# Patient Record
Sex: Female | Born: 1955 | Race: Black or African American | Hispanic: No | Marital: Married | Smoking: Former smoker
Health system: Southern US, Community
[De-identification: ages and names within clinical notes are randomized; demographics above are authoritative.]

## PROBLEM LIST (undated history)

## (undated) DIAGNOSIS — M1990 Unspecified osteoarthritis, unspecified site: Secondary | ICD-10-CM

## (undated) DIAGNOSIS — G8929 Other chronic pain: Secondary | ICD-10-CM

## (undated) DIAGNOSIS — E559 Vitamin D deficiency, unspecified: Secondary | ICD-10-CM

## (undated) DIAGNOSIS — R7303 Prediabetes: Secondary | ICD-10-CM

## (undated) DIAGNOSIS — E785 Hyperlipidemia, unspecified: Secondary | ICD-10-CM

## (undated) DIAGNOSIS — R600 Localized edema: Secondary | ICD-10-CM

## (undated) DIAGNOSIS — I10 Essential (primary) hypertension: Secondary | ICD-10-CM

## (undated) DIAGNOSIS — M2550 Pain in unspecified joint: Secondary | ICD-10-CM

## (undated) HISTORY — PX: BREAST BIOPSY: SHX20

## (undated) HISTORY — DX: Vitamin D deficiency, unspecified: E55.9

## (undated) HISTORY — DX: Pain in unspecified joint: M25.50

## (undated) HISTORY — DX: Hyperlipidemia, unspecified: E78.5

## (undated) HISTORY — DX: Localized edema: R60.0

## (undated) HISTORY — DX: Unspecified osteoarthritis, unspecified site: M19.90

## (undated) HISTORY — DX: Essential (primary) hypertension: I10

## (undated) HISTORY — PX: TONSILLECTOMY: SUR1361

## (undated) HISTORY — DX: Prediabetes: R73.03

## (undated) HISTORY — PX: OTHER SURGICAL HISTORY: SHX169

---

## 2009-06-21 ENCOUNTER — Emergency Department (HOSPITAL_COMMUNITY): Admission: EM | Admit: 2009-06-21 | Discharge: 2009-06-21 | Payer: Self-pay | Admitting: Emergency Medicine

## 2010-01-12 ENCOUNTER — Inpatient Hospital Stay (HOSPITAL_COMMUNITY): Admission: EM | Admit: 2010-01-12 | Discharge: 2010-01-15 | Payer: Self-pay | Admitting: Emergency Medicine

## 2010-04-30 ENCOUNTER — Encounter: Payer: Self-pay | Admitting: Orthopedic Surgery

## 2010-05-07 ENCOUNTER — Ambulatory Visit: Payer: Self-pay | Admitting: Orthopedic Surgery

## 2010-05-07 DIAGNOSIS — M1710 Unilateral primary osteoarthritis, unspecified knee: Secondary | ICD-10-CM

## 2010-05-13 ENCOUNTER — Encounter (HOSPITAL_COMMUNITY)
Admission: RE | Admit: 2010-05-13 | Discharge: 2010-05-30 | Payer: Self-pay | Source: Home / Self Care | Admitting: Orthopedic Surgery

## 2010-05-20 ENCOUNTER — Encounter: Payer: Self-pay | Admitting: Orthopedic Surgery

## 2010-06-03 ENCOUNTER — Encounter (HOSPITAL_COMMUNITY): Admission: RE | Admit: 2010-06-03 | Discharge: 2010-07-03 | Payer: Self-pay | Admitting: Orthopedic Surgery

## 2010-06-20 ENCOUNTER — Encounter: Payer: Self-pay | Admitting: Orthopedic Surgery

## 2010-09-30 NOTE — Letter (Signed)
Summary: History form  History form   Imported By: Jacklynn Ganong 05/09/2010 10:11:03  _____________________________________________________________________  External Attachment:    Type:   Image     Comment:   External Document

## 2010-09-30 NOTE — Miscellaneous (Signed)
Summary: PT initial evaluation  PT initial evaluation   Imported By: Jacklynn Ganong 05/20/2010 12:19:03  _____________________________________________________________________  External Attachment:    Type:   Image     Comment:   External Document

## 2010-09-30 NOTE — Assessment & Plan Note (Signed)
Summary: BI KNEE PAIN NEEDS XR/REG MEDICAID/BSF   Vital Signs:  Patient profile:   55 year old female Height:      69 inches Weight:      266 pounds Pulse rate:   78 / minute Resp:     18 per minute  Vitals Entered By: Fuller Canada MD (May 07, 2010 9:01 AM)  Visit Type:  new patient Referring Provider:  self Primary Provider:  [place]  CC:  bilateral knee pain.  History of Present Illness: I saw Yesenia Montgomery in the office today for an initial visit.  She is a 55 years old woman with the complaint of:  bilateral knee pain.  Xrays brought from [facility] 2009, new xrays to be taken today.  Meds: HCTZ, Lisinopril.   This 55 year old female has been on multiple anti-inflammatories including Naprosyn, Vioxx, Bextra for knee pain and arthritis who presents after moving from [place] to [facility] seeking orthopaedic care for severe bilateral medial and anterior knee pain which is nonradiating.  Her symptoms seem to be modified by weightbearing activities and there is associated swelling and catching sensations in the knee.  She reports her pain as 10 out of 10.  She did get relief from Vioxx until they took it off the market.     Allergies (verified): No Known Drug Allergies  Past History:  Past Medical History: htn  Past Surgical History: na  Family History: na  Social History: Patient is separated.  unemployed no smoking no alcohol caffeine use daily some college  Review of Systems Cardiovascular:  Denies chest pain, angina, heart attack, heart failure, poor circulation, blood clots, and phlebitis. Respiratory:  Denies short of breath, difficulty breathing, COPD, cough, and pneumonia. Gastrointestinal:  Denies nausea, vomiting, diarrhea, constipation, difficulty swallowing, ulcers, GERD, and reflux. Genitourinary:  Denies kidney failure, kidney transplant, kidney stones, burning, poor stream, testicular cancer, blood in urine, and . Neurologic:  Denies  headache, dizziness, migraines, numbness, weakness, tremor, and unsteady walking. Musculoskeletal:  Complains of joint pain and joint swelling; denies rheumatoid arthritis, gout, bone cancer, osteoporosis, and ; stiffness. Endocrine:  Denies thyroid disease, goiter, and diabetes. Psychiatric:  Denies depression, mood swings, anxiety, panic attack, bipolar, and schizophrenia. Skin:  Denies eczema, cancer, and itching. HEENT:  Denies poor vision, cataracts, glaucoma, poor hearing, vertigo, ears ringing, sinusitis, hoarseness, toothaches, and bleeding gums. Hemoatologic:  Denies lymph node cancer and lymph edema.  The review of systems is negative for Constitutional and Immunology.  Physical Exam  Additional Exam:  GEN: well developed, well nourished, normal grooming and hygiene, no deformity and normal body habitus.   PPI:RJJOA. extremites: pulses are normal, mild edema, no erythema. no tenderness  Skin: LE'S: no rashes, skin lesions or open sores   NEURO: LE'S: normal coordination, reflexes, sensation.   Psyche: awake, alert and oriented. Mood normal   Gait: normal heel toe gait  RIGHT knee medial joint line tenderness small joint effusion range of motion 105 appears to have full extension with mild valgus.  Muscle tone quadriceps bulk extension power normal.  Knee stable.  LEFT knee flexion arc 105 full extension appears to have valgus.  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 1 by mouth two times a day  Orders: Physical  Therapy Referral (PT) New Patient Level III (28413) Knees  x-ray bilateral ([id number]) Injection, Tendon / Ligament (25366) Depo- Medrol 40mg  (J1030) bilateral knee x-rays show bilateral osteoarthritis with cyst  formation notable for LEFT knee lateral tibial plateau  Patient has end-stage osteoarthritis of both knees.  Films from 2009 show cyst formation joint space narrowing as well  Bilateral knee injections RIGHT leg first following technique Verbal consent was obtained. The knee was prepped with alcohol and ethyl chloride. 1 cc of depomedrol 40mg /cc and 4 cc of lidocaine 1% was injected. there were no complications.  Basically this is a 55 year old female who has not had full medical management of her osteoarthritis.  So it like to get her to lose about 25 pounds switch to another anti-inflammatory given the 2 injections e and her knee exercise program see how she does.  She'll obviously have to have knee replacements at some point.  Medications Added to Medication List This Visit: 1)  Diclofenac Sodium 50 Mg Tbec (Diclofenac sodium) .Marland Kitchen.. 1 by mouth two times a day  Patient Instructions: 1)  you have arthritis in your knees  2)  you need  to lose about 25 lbs 3)  you need to do a therapy program  4)  new medication: diclofenac 50 mg two times a day 5)  You have received an injection of cortisone today. You may experience increased pain at the injection site. Apply ice pack to the area for 20 minutes every 2 hours and take 2 xtra strength tylenol every 8 hours. This increased pain will usually resolve in 24 hours. The injection will take effect in 3-10 days.  6)    7)  f/u as needed  Prescriptions: DICLOFENAC SODIUM 50 MG TBEC (DICLOFENAC SODIUM) 1 by mouth two times a day  #60 x 5   Entered and Authorized by:   Fuller Canada MD   Signed by:   Fuller Canada MD on 05/07/2010   Method used:   Print then Give to Patient   RxID:   [id number]

## 2010-09-30 NOTE — Miscellaneous (Signed)
Summary: PT progress note  PT progress note   Imported By: Jacklynn Ganong 06/26/2010 07:55:43  _____________________________________________________________________  External Attachment:    Type:   Image     Comment:   External Document

## 2010-10-21 ENCOUNTER — Encounter: Payer: Self-pay | Admitting: Orthopedic Surgery

## 2010-10-21 ENCOUNTER — Ambulatory Visit (INDEPENDENT_AMBULATORY_CARE_PROVIDER_SITE_OTHER): Payer: Medicare Other | Admitting: Orthopedic Surgery

## 2010-10-21 ENCOUNTER — Telehealth: Payer: Self-pay | Admitting: Orthopedic Surgery

## 2010-10-21 DIAGNOSIS — M1710 Unilateral primary osteoarthritis, unspecified knee: Secondary | ICD-10-CM

## 2010-10-28 NOTE — Assessment & Plan Note (Signed)
Summary: Req inj in both knee,treated before,says reg mcd/bsf   Visit Type:  Follow-up Referring Provider:  self Primary Provider:  [place]  CC:  BILATERAL KNEE PAIN.  History of Present Illness: diagnosis bilateral knee osteoarthritis.  Patient here for followup as a 55 year old female with bilateral knee pain and bilateral knee arthritis, who was treated nonoperatively with injections, and anti-inflammatories.  She would like to get injected again. Did well really until this past Sunday when she had increased pain. Her last 2 months. It seems like her pain has been increasing.  Meds: HCTZ, Lisinopril, Diclofenac. She needs refill on Diclofenac.  Last seen on 05-07-10 and had injections in both knees.  Systems weakness in her legs and sense of loss of balance.     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 The left knee was prepped with alcohol and ethyl chloride. 1 cc of depomedrol 40mg /cc and 4 cc of lidocaine 1% was injected. there were no complications.   Verbal consent was obtained. The RIGHT knee was prepped with alcohol and ethyl chloride. 1 cc of depomedrol 40mg /cc and 4 cc of lidocaine 1% was injected. there were no complications.  Her updated medication list for this problem includes:    Diclofenac Sodium 50 Mg Tbec (Diclofenac sodium) .Marland Kitchen... 1 by mouth two times a day  Orders: Joint Aspirate / Injection, Large (20610) Depo- Medrol 40mg  (J1030)  Patient Instructions: 1)  Please schedule a follow-up appointment as needed. 2)  You have received an injection of cortisone today. You may experience increased pain at the injection site. Apply ice pack to the area for 20 minutes every 2 hours and take 2 xtra strength tylenol every 8 hours. This increased pain will usually resolve in 24 hours. The  injection will take effect in 3-10 days.  3)  exercises as directed  Prescriptions: DICLOFENAC SODIUM 50 MG TBEC (DICLOFENAC SODIUM) 1 by mouth two times a day  #60 x 5   Entered and Authorized by:   Fuller Canada MD   Signed by:   Fuller Canada MD on 10/21/2010   Method used:   Print then Give to Patient   RxID:   [id number]    Orders Added: 1)  Joint Aspirate / Injection, Large [20610] 2)  Depo- Medrol 40mg  [J1030]

## 2010-10-28 NOTE — Medication Information (Signed)
Summary: Tax adviser   Imported By: Cammie Sickle 10/21/2010 19:06:09  _____________________________________________________________________  External Attachment:    Type:   Image     Comment:   External Document

## 2010-10-28 NOTE — Progress Notes (Signed)
  Phone Note Other Incoming   Summary of Call: Per Rob [employer] compliance,we must file Medicare primary, Medicaid secondary for this patient.  If patient chooses not to file Medicare/Medicaid, she will be set up as sselfpay and responsible for all charges. Patient requested that Medicare not be filed, only wanted [place] to file Medicaid.  This pertains to her 10/21/10 appointment with Dr. Romeo Apple.  Initial call taken by: Jacklynn Ganong,  October 21, 2010 4:58 PM

## 2010-11-17 LAB — DIFFERENTIAL
Basophils Relative: 0 % (ref 0–1)
Eosinophils Absolute: 0.1 10*3/uL (ref 0.0–0.7)
Eosinophils Absolute: 0.2 10*3/uL (ref 0.0–0.7)
Eosinophils Relative: 1 % (ref 0–5)
Eosinophils Relative: 4 % (ref 0–5)
Lymphocytes Relative: 17 % (ref 12–46)
Lymphs Abs: 2.1 10*3/uL (ref 0.7–4.0)
Monocytes Absolute: 0.5 10*3/uL (ref 0.1–1.0)
Monocytes Relative: 9 % (ref 3–12)
Neutro Abs: 5.7 10*3/uL (ref 1.7–7.7)
Neutro Abs: 8.3 10*3/uL — ABNORMAL HIGH (ref 1.7–7.7)
Neutrophils Relative %: 59 % (ref 43–77)

## 2010-11-17 LAB — CBC
HCT: 32.4 % — ABNORMAL LOW (ref 36.0–46.0)
HCT: 33.3 % — ABNORMAL LOW (ref 36.0–46.0)
HCT: 33.8 % — ABNORMAL LOW (ref 36.0–46.0)
Hemoglobin: 11.3 g/dL — ABNORMAL LOW (ref 12.0–15.0)
Hemoglobin: 11.6 g/dL — ABNORMAL LOW (ref 12.0–15.0)
MCHC: 34.9 g/dL (ref 30.0–36.0)
MCV: 85 fL (ref 78.0–100.0)
MCV: 85.1 fL (ref 78.0–100.0)
MCV: 85.3 fL (ref 78.0–100.0)
Platelets: 261 10*3/uL (ref 150–400)
Platelets: 305 10*3/uL (ref 150–400)
Platelets: 402 10*3/uL — ABNORMAL HIGH (ref 150–400)
RBC: 3.92 MIL/uL (ref 3.87–5.11)
RBC: 3.97 MIL/uL (ref 3.87–5.11)
RDW: 14.2 % (ref 11.5–15.5)
RDW: 14.3 % (ref 11.5–15.5)
WBC: 11.1 10*3/uL — ABNORMAL HIGH (ref 4.0–10.5)
WBC: 8.6 10*3/uL (ref 4.0–10.5)

## 2010-11-17 LAB — URINE CULTURE

## 2010-11-17 LAB — COMPREHENSIVE METABOLIC PANEL
ALT: 24 U/L (ref 0–35)
Albumin: 3.2 g/dL — ABNORMAL LOW (ref 3.5–5.2)
Alkaline Phosphatase: 65 U/L (ref 39–117)
Calcium: 9 mg/dL (ref 8.4–10.5)
Chloride: 100 mEq/L (ref 96–112)
GFR calc non Af Amer: >60 mL/min (ref >60)
Potassium: 3.9 mEq/L (ref 3.5–5.1)
Total Bilirubin: 0.7 mg/dL (ref 0.3–1.2)

## 2010-11-17 LAB — BASIC METABOLIC PANEL
BUN: 6 mg/dL (ref 6–23)
CO2: 29 mEq/L (ref 19–32)
Calcium: 8.7 mg/dL (ref 8.4–10.5)
Chloride: 107 mEq/L (ref 96–112)
Creatinine, Ser: 0.75 mg/dL (ref 0.4–1.2)
Creatinine, Ser: 0.83 mg/dL (ref 0.4–1.2)
Glucose, Bld: 98 mg/dL (ref 70–99)
Sodium: 139 mEq/L (ref 135–145)

## 2010-11-17 LAB — CULTURE, BLOOD (ROUTINE X 2)
Culture: NO GROWTH
Report Status: 5202011

## 2010-11-17 LAB — URINE MICROSCOPIC-ADD ON

## 2010-11-17 LAB — URINALYSIS, ROUTINE W REFLEX MICROSCOPIC
Glucose, UA: NEGATIVE mg/dL
Ketones, ur: NEGATIVE mg/dL
Nitrite: NEGATIVE
Protein, ur: NEGATIVE mg/dL
Specific Gravity, Urine: 1.01 (ref 1.005–1.030)

## 2010-11-17 LAB — EXPECTORATED SPUTUM ASSESSMENT W GRAM STAIN, RFLX TO RESP C

## 2010-11-17 LAB — CULTURE, RESPIRATORY W GRAM STAIN

## 2010-11-21 ENCOUNTER — Emergency Department (HOSPITAL_COMMUNITY): Payer: Medicare Other

## 2010-11-21 ENCOUNTER — Emergency Department (HOSPITAL_COMMUNITY)
Admission: EM | Admit: 2010-11-21 | Discharge: 2010-11-21 | Disposition: A | Discharge status: Home / Self Care | Payer: Medicare Other | Attending: Emergency Medicine | Admitting: Emergency Medicine

## 2010-11-21 DIAGNOSIS — S42453A Displaced fracture of lateral condyle of unspecified humerus, initial encounter for closed fracture: Secondary | ICD-10-CM | POA: Insufficient documentation

## 2010-11-21 DIAGNOSIS — Y92009 Unspecified place in unspecified non-institutional (private) residence as the place of occurrence of the external cause: Secondary | ICD-10-CM | POA: Insufficient documentation

## 2010-11-21 DIAGNOSIS — I10 Essential (primary) hypertension: Secondary | ICD-10-CM | POA: Insufficient documentation

## 2010-11-21 DIAGNOSIS — W19XXXA Unspecified fall, initial encounter: Secondary | ICD-10-CM | POA: Insufficient documentation

## 2010-11-21 DIAGNOSIS — S52009A Unspecified fracture of upper end of unspecified ulna, initial encounter for closed fracture: Secondary | ICD-10-CM | POA: Insufficient documentation

## 2010-12-19 ENCOUNTER — Ambulatory Visit (HOSPITAL_COMMUNITY)
Admission: RE | Admit: 2010-12-19 | Discharge: 2010-12-19 | Disposition: A | Discharge status: Home / Self Care | Payer: Medicare Other | Source: Ambulatory Visit | Attending: Physical Therapy | Admitting: Physical Therapy

## 2010-12-19 DIAGNOSIS — M25529 Pain in unspecified elbow: Secondary | ICD-10-CM | POA: Insufficient documentation

## 2010-12-19 DIAGNOSIS — M25429 Effusion, unspecified elbow: Secondary | ICD-10-CM | POA: Insufficient documentation

## 2010-12-19 DIAGNOSIS — M25439 Effusion, unspecified wrist: Secondary | ICD-10-CM | POA: Insufficient documentation

## 2010-12-19 DIAGNOSIS — M25629 Stiffness of unspecified elbow, not elsewhere classified: Secondary | ICD-10-CM | POA: Insufficient documentation

## 2010-12-19 DIAGNOSIS — M25539 Pain in unspecified wrist: Secondary | ICD-10-CM | POA: Insufficient documentation

## 2010-12-19 DIAGNOSIS — M25639 Stiffness of unspecified wrist, not elsewhere classified: Secondary | ICD-10-CM | POA: Insufficient documentation

## 2010-12-19 DIAGNOSIS — IMO0001 Reserved for inherently not codable concepts without codable children: Secondary | ICD-10-CM | POA: Insufficient documentation

## 2010-12-19 DIAGNOSIS — M6281 Muscle weakness (generalized): Secondary | ICD-10-CM | POA: Insufficient documentation

## 2010-12-23 ENCOUNTER — Ambulatory Visit (HOSPITAL_COMMUNITY)
Admission: RE | Admit: 2010-12-23 | Discharge: 2010-12-23 | Disposition: A | Discharge status: Home / Self Care | Payer: Medicare Other | Source: Ambulatory Visit | Attending: Orthopaedic Surgery | Admitting: Orthopaedic Surgery

## 2010-12-23 DIAGNOSIS — M25529 Pain in unspecified elbow: Secondary | ICD-10-CM | POA: Insufficient documentation

## 2010-12-23 DIAGNOSIS — M25629 Stiffness of unspecified elbow, not elsewhere classified: Secondary | ICD-10-CM | POA: Insufficient documentation

## 2010-12-23 DIAGNOSIS — M6281 Muscle weakness (generalized): Secondary | ICD-10-CM | POA: Insufficient documentation

## 2010-12-23 DIAGNOSIS — IMO0001 Reserved for inherently not codable concepts without codable children: Secondary | ICD-10-CM | POA: Insufficient documentation

## 2010-12-23 DIAGNOSIS — M25639 Stiffness of unspecified wrist, not elsewhere classified: Secondary | ICD-10-CM | POA: Insufficient documentation

## 2010-12-25 ENCOUNTER — Ambulatory Visit (HOSPITAL_COMMUNITY)
Admission: RE | Admit: 2010-12-25 | Discharge: 2010-12-25 | Disposition: A | Discharge status: Home / Self Care | Payer: Medicare Other | Source: Ambulatory Visit | Attending: *Deleted | Admitting: *Deleted

## 2010-12-30 ENCOUNTER — Ambulatory Visit (HOSPITAL_COMMUNITY)
Admission: RE | Admit: 2010-12-30 | Discharge: 2010-12-30 | Disposition: A | Discharge status: Home / Self Care | Payer: Medicare Other | Source: Ambulatory Visit | Attending: Physical Therapy | Admitting: Physical Therapy

## 2010-12-30 DIAGNOSIS — M25629 Stiffness of unspecified elbow, not elsewhere classified: Secondary | ICD-10-CM | POA: Insufficient documentation

## 2010-12-30 DIAGNOSIS — M25539 Pain in unspecified wrist: Secondary | ICD-10-CM | POA: Insufficient documentation

## 2010-12-30 DIAGNOSIS — M6281 Muscle weakness (generalized): Secondary | ICD-10-CM | POA: Insufficient documentation

## 2010-12-30 DIAGNOSIS — M25429 Effusion, unspecified elbow: Secondary | ICD-10-CM | POA: Insufficient documentation

## 2010-12-30 DIAGNOSIS — M25529 Pain in unspecified elbow: Secondary | ICD-10-CM | POA: Insufficient documentation

## 2010-12-30 DIAGNOSIS — M25639 Stiffness of unspecified wrist, not elsewhere classified: Secondary | ICD-10-CM | POA: Insufficient documentation

## 2010-12-30 DIAGNOSIS — M25439 Effusion, unspecified wrist: Secondary | ICD-10-CM | POA: Insufficient documentation

## 2010-12-30 DIAGNOSIS — IMO0001 Reserved for inherently not codable concepts without codable children: Secondary | ICD-10-CM | POA: Insufficient documentation

## 2011-01-01 ENCOUNTER — Ambulatory Visit (HOSPITAL_COMMUNITY)
Admission: RE | Admit: 2011-01-01 | Discharge: 2011-01-01 | Disposition: A | Discharge status: Home / Self Care | Payer: Medicare Other | Source: Ambulatory Visit | Attending: *Deleted | Admitting: *Deleted

## 2011-01-06 ENCOUNTER — Ambulatory Visit (HOSPITAL_COMMUNITY)
Admission: RE | Admit: 2011-01-06 | Discharge: 2011-01-06 | Disposition: A | Discharge status: Home / Self Care | Payer: Medicare Other | Source: Ambulatory Visit | Attending: *Deleted | Admitting: *Deleted

## 2011-01-08 ENCOUNTER — Ambulatory Visit (HOSPITAL_COMMUNITY)
Admission: RE | Admit: 2011-01-08 | Discharge: 2011-01-08 | Disposition: A | Discharge status: Home / Self Care | Payer: Medicare Other | Source: Ambulatory Visit | Attending: *Deleted | Admitting: *Deleted

## 2011-01-14 ENCOUNTER — Ambulatory Visit (HOSPITAL_COMMUNITY)
Admission: RE | Admit: 2011-01-14 | Discharge: 2011-01-14 | Disposition: A | Discharge status: Home / Self Care | Payer: Medicare Other | Source: Ambulatory Visit | Attending: *Deleted | Admitting: *Deleted

## 2011-01-16 ENCOUNTER — Ambulatory Visit (HOSPITAL_COMMUNITY)
Admission: RE | Admit: 2011-01-16 | Discharge: 2011-01-16 | Disposition: A | Discharge status: Home / Self Care | Payer: Medicare Other | Source: Ambulatory Visit | Attending: *Deleted | Admitting: *Deleted

## 2011-01-17 ENCOUNTER — Emergency Department (HOSPITAL_COMMUNITY)
Admission: EM | Admit: 2011-01-17 | Discharge: 2011-01-18 | Disposition: A | Discharge status: Home / Self Care | Payer: Medicare Other | Attending: Emergency Medicine | Admitting: Emergency Medicine

## 2011-01-17 DIAGNOSIS — Z79899 Other long term (current) drug therapy: Secondary | ICD-10-CM | POA: Insufficient documentation

## 2011-01-17 DIAGNOSIS — N390 Urinary tract infection, site not specified: Secondary | ICD-10-CM | POA: Insufficient documentation

## 2011-01-17 DIAGNOSIS — M25569 Pain in unspecified knee: Secondary | ICD-10-CM | POA: Insufficient documentation

## 2011-01-17 DIAGNOSIS — IMO0001 Reserved for inherently not codable concepts without codable children: Secondary | ICD-10-CM | POA: Insufficient documentation

## 2011-01-17 DIAGNOSIS — M25529 Pain in unspecified elbow: Secondary | ICD-10-CM | POA: Insufficient documentation

## 2011-01-17 DIAGNOSIS — I10 Essential (primary) hypertension: Secondary | ICD-10-CM | POA: Insufficient documentation

## 2011-01-18 LAB — BASIC METABOLIC PANEL
Calcium: 10.6 mg/dL — ABNORMAL HIGH (ref 8.4–10.5)
GFR calc non Af Amer: >60 mL/min (ref >60)
Glucose, Bld: 131 mg/dL — ABNORMAL HIGH (ref 70–99)
Potassium: 3.6 mEq/L (ref 3.5–5.1)
Sodium: 138 mEq/L (ref 135–145)

## 2011-01-18 LAB — DIFFERENTIAL
Basophils Relative: 0 % (ref 0–1)
Eosinophils Relative: 2 % (ref 0–5)
Lymphocytes Relative: 35 % (ref 12–46)
Lymphs Abs: 2.5 10*3/uL (ref 0.7–4.0)
Monocytes Absolute: 0.5 10*3/uL (ref 0.1–1.0)
Monocytes Relative: 6 % (ref 3–12)
Neutrophils Relative %: 57 % (ref 43–77)

## 2011-01-18 LAB — CBC
HCT: 39.5 % (ref 36.0–46.0)
Hemoglobin: 12.5 g/dL (ref 12.0–15.0)
RDW: 13.9 % (ref 11.5–15.5)
WBC: 7.1 10*3/uL (ref 4.0–10.5)

## 2011-01-18 LAB — URINE MICROSCOPIC-ADD ON

## 2011-01-18 LAB — URINALYSIS, ROUTINE W REFLEX MICROSCOPIC
Bilirubin Urine: NEGATIVE
Glucose, UA: NEGATIVE mg/dL
Ketones, ur: NEGATIVE mg/dL

## 2011-01-18 LAB — SEDIMENTATION RATE: Sed Rate: 47 mm/hr — ABNORMAL HIGH (ref 0–22)

## 2011-01-20 ENCOUNTER — Ambulatory Visit (HOSPITAL_COMMUNITY)
Admission: RE | Admit: 2011-01-20 | Discharge: 2011-01-20 | Disposition: A | Discharge status: Home / Self Care | Payer: Medicare Other | Source: Ambulatory Visit | Attending: *Deleted | Admitting: *Deleted

## 2011-01-22 ENCOUNTER — Ambulatory Visit (HOSPITAL_COMMUNITY)
Admission: RE | Admit: 2011-01-22 | Discharge: 2011-01-22 | Disposition: A | Discharge status: Home / Self Care | Payer: Medicare Other | Source: Ambulatory Visit | Attending: *Deleted | Admitting: *Deleted

## 2011-01-27 ENCOUNTER — Ambulatory Visit (HOSPITAL_COMMUNITY)
Admission: RE | Admit: 2011-01-27 | Discharge: 2011-01-27 | Disposition: A | Discharge status: Home / Self Care | Payer: Medicare Other | Source: Ambulatory Visit | Attending: *Deleted | Admitting: *Deleted

## 2011-01-29 ENCOUNTER — Ambulatory Visit (HOSPITAL_COMMUNITY)
Admission: RE | Admit: 2011-01-29 | Discharge: 2011-01-29 | Disposition: A | Discharge status: Home / Self Care | Payer: Medicare Other | Source: Ambulatory Visit | Attending: *Deleted | Admitting: *Deleted

## 2011-02-04 ENCOUNTER — Ambulatory Visit (HOSPITAL_COMMUNITY)
Admission: RE | Admit: 2011-02-04 | Discharge: 2011-02-04 | Disposition: A | Discharge status: Home / Self Care | Payer: Medicare Other | Source: Ambulatory Visit | Attending: Orthopedic Surgery | Admitting: Orthopedic Surgery

## 2011-02-04 DIAGNOSIS — M25639 Stiffness of unspecified wrist, not elsewhere classified: Secondary | ICD-10-CM | POA: Insufficient documentation

## 2011-02-04 DIAGNOSIS — M25529 Pain in unspecified elbow: Secondary | ICD-10-CM | POA: Insufficient documentation

## 2011-02-04 DIAGNOSIS — M6281 Muscle weakness (generalized): Secondary | ICD-10-CM | POA: Insufficient documentation

## 2011-02-04 DIAGNOSIS — M25439 Effusion, unspecified wrist: Secondary | ICD-10-CM | POA: Insufficient documentation

## 2011-02-04 DIAGNOSIS — IMO0001 Reserved for inherently not codable concepts without codable children: Secondary | ICD-10-CM | POA: Insufficient documentation

## 2011-02-04 DIAGNOSIS — M25429 Effusion, unspecified elbow: Secondary | ICD-10-CM | POA: Insufficient documentation

## 2011-02-04 DIAGNOSIS — M25629 Stiffness of unspecified elbow, not elsewhere classified: Secondary | ICD-10-CM | POA: Insufficient documentation

## 2011-02-04 DIAGNOSIS — M25539 Pain in unspecified wrist: Secondary | ICD-10-CM | POA: Insufficient documentation

## 2011-02-06 ENCOUNTER — Ambulatory Visit (HOSPITAL_COMMUNITY)
Admission: RE | Admit: 2011-02-06 | Discharge: 2011-02-06 | Disposition: A | Discharge status: Home / Self Care | Payer: Medicare Other | Source: Ambulatory Visit | Attending: Specialist | Admitting: Specialist

## 2011-02-10 ENCOUNTER — Ambulatory Visit (HOSPITAL_COMMUNITY)
Admission: RE | Admit: 2011-02-10 | Discharge: 2011-02-10 | Disposition: A | Discharge status: Home / Self Care | Payer: Medicare Other | Source: Ambulatory Visit | Attending: Specialist | Admitting: Specialist

## 2011-02-12 ENCOUNTER — Ambulatory Visit (HOSPITAL_COMMUNITY): Payer: Medicare Other | Admitting: Specialist

## 2011-02-18 ENCOUNTER — Ambulatory Visit (HOSPITAL_COMMUNITY)
Admission: RE | Admit: 2011-02-18 | Discharge: 2011-02-18 | Disposition: A | Discharge status: Home / Self Care | Payer: Medicare Other | Source: Ambulatory Visit | Attending: *Deleted | Admitting: *Deleted

## 2011-02-20 ENCOUNTER — Ambulatory Visit (HOSPITAL_COMMUNITY)
Admission: RE | Admit: 2011-02-20 | Discharge: 2011-02-20 | Disposition: A | Discharge status: Home / Self Care | Payer: Medicare Other | Source: Ambulatory Visit | Attending: *Deleted | Admitting: *Deleted

## 2011-02-24 ENCOUNTER — Ambulatory Visit (HOSPITAL_COMMUNITY)
Admission: RE | Admit: 2011-02-24 | Discharge: 2011-02-24 | Disposition: A | Discharge status: Home / Self Care | Payer: Medicare Other | Source: Ambulatory Visit | Attending: *Deleted | Admitting: *Deleted

## 2011-02-26 ENCOUNTER — Ambulatory Visit (HOSPITAL_COMMUNITY)
Admission: RE | Admit: 2011-02-26 | Discharge: 2011-02-26 | Disposition: A | Discharge status: Home / Self Care | Payer: Medicare Other | Source: Ambulatory Visit | Attending: *Deleted | Admitting: *Deleted

## 2011-03-03 ENCOUNTER — Ambulatory Visit (HOSPITAL_COMMUNITY)
Admission: RE | Admit: 2011-03-03 | Discharge: 2011-03-03 | Disposition: A | Discharge status: Home / Self Care | Payer: Medicare Other | Source: Ambulatory Visit | Attending: Orthopedic Surgery | Admitting: Orthopedic Surgery

## 2011-03-03 DIAGNOSIS — M25529 Pain in unspecified elbow: Secondary | ICD-10-CM | POA: Insufficient documentation

## 2011-03-03 DIAGNOSIS — M25429 Effusion, unspecified elbow: Secondary | ICD-10-CM | POA: Insufficient documentation

## 2011-03-03 DIAGNOSIS — IMO0001 Reserved for inherently not codable concepts without codable children: Secondary | ICD-10-CM | POA: Insufficient documentation

## 2011-03-03 DIAGNOSIS — M6281 Muscle weakness (generalized): Secondary | ICD-10-CM | POA: Insufficient documentation

## 2011-03-03 DIAGNOSIS — M25639 Stiffness of unspecified wrist, not elsewhere classified: Secondary | ICD-10-CM | POA: Insufficient documentation

## 2011-03-03 DIAGNOSIS — M25539 Pain in unspecified wrist: Secondary | ICD-10-CM | POA: Insufficient documentation

## 2011-03-03 DIAGNOSIS — M25629 Stiffness of unspecified elbow, not elsewhere classified: Secondary | ICD-10-CM | POA: Insufficient documentation

## 2011-03-03 DIAGNOSIS — M25439 Effusion, unspecified wrist: Secondary | ICD-10-CM | POA: Insufficient documentation

## 2011-03-06 ENCOUNTER — Ambulatory Visit (HOSPITAL_COMMUNITY)
Admission: RE | Admit: 2011-03-06 | Discharge: 2011-03-06 | Disposition: A | Discharge status: Home / Self Care | Payer: Medicare Other | Source: Ambulatory Visit | Attending: Specialist | Admitting: Specialist

## 2011-03-10 ENCOUNTER — Ambulatory Visit (HOSPITAL_COMMUNITY)
Admission: RE | Admit: 2011-03-10 | Discharge: 2011-03-10 | Disposition: A | Discharge status: Home / Self Care | Payer: Medicare Other | Source: Ambulatory Visit | Attending: *Deleted | Admitting: *Deleted

## 2011-03-10 NOTE — Progress Notes (Signed)
Occupational Therapy Treatment  Patient Name: Yesenia Montgomery MRN: [id number] Today's Date: 03/10/2011  HPI: Pain Assessment Currently in Pain?: No/denies (just very tight)  Precautions/Restrictions    Mobility       Exercise/Treatments @FLOW [phone]  Goals Long Term Goals Long Term Goal 1: Decrease fascial restrictions an dedema to trace in her RUE for increased AROM and decreased pain. Long Term Goal 1 Progress: Met Long Term Goal 2: Increase AROM to WNL for increased independence with cooking and cleaning tasks Long Term Goal 2 Progress: Met Long Term Goal 3: Increase right upper extremity strength to 4+/5 for increased inderpendence with household chores Long Term Goal 3 Progress: Progressing toward goal Long Term Goal 4: Increase right grip strength to lqual left grip strerngth for return to prior level of independence with ADL tasks. Long Term Goal 4 Progress: Progressing toward goal Long Term Goal 5: Decrease pain to 1/10 when completing functional tasks Long Term Goal 5 Progress: Progressing toward goal Additional Long Term Goals?: Yes Long Term Goal 6: Return to propr level of independence with B/IADLs and leisre activiyie End of Session Patient Active Problem List  Diagnoses  . OSTEOARTHRITIS, KNEES, BILATERAL, SEVERE  . Closed fracture of head of radius  . Muscle weakness (generalized)  . Pain in joint, forearm   End of Session Activity Tolerance: Patient tolerated treatment well OT Assessment and Plan Clinical Impression Statement: slowly increasing ROM with PROM Prognosis: Good OT Frequency: Min 2X/week OT Duration: 4 weeks OT  Treatment/Interventions: Therapeutic exercise;Manual therapy OT Plan: reassess next week   Noralee Stain, Rob Mciver L 03/10/2011, 12:05 PM

## 2011-03-10 NOTE — Progress Notes (Deleted)
Occupational Therapy Treatment  Patient Name: Yesenia Montgomery MRN: [id number] Today's Date: 03/10/2011  HPI: Pain Assessment Currently in Pain?: No/denies (just very tight)  Precautions/Restrictions    Mobility       Exercise/Treatments @FLOW [phone]  Goals Long Term Goals Long Term Goal 1: Decrease fascial restrictions an dedema to trace in her RUE for increased AROM and decreased pain. Long Term Goal 1 Progress: Met Long Term Goal 2: Increase AROM to WNL for increased independence with cooking and cleaning tasks Long Term Goal 2 Progress: Met Long Term Goal 3: Increase right upper extremity strength to 4+/5 for increased inderpendence with household chores Long Term Goal 3 Progress: Progressing toward goal Long Term Goal 4: Increase right grip strength to lqual left grip strerngth for return to prior level of independence with ADL tasks. Long Term Goal 4 Progress: Progressing toward goal Long Term Goal 5: Decrease pain to 1/10 when completing functional tasks Long Term Goal 5 Progress: Progressing toward goal Additional Long Term Goals?: Yes Long Term Goal 6: Return to propr level of independence with B/IADLs and leisre activiyie End of Session Patient Active Problem List  Diagnoses  . OSTEOARTHRITIS, KNEES, BILATERAL, SEVERE  . Closed fracture of head of radius  . Muscle weakness (generalized)  . Pain in joint, forearm   End of Session Activity Tolerance: Patient tolerated treatment well OT Assessment and Plan Clinical Impression Statement: slowly increasing ROM with PROM Prognosis: Good OT Frequency: Min 2X/week OT Duration: 4 weeks OT  Treatment/Interventions: Therapeutic exercise;Manual therapy OT Plan: reassess next week   Noralee Stain, Alexius Hangartner L 03/10/2011, 12:08 PM

## 2011-03-11 ENCOUNTER — Telehealth (HOSPITAL_COMMUNITY): Payer: Self-pay

## 2011-03-11 NOTE — Telephone Encounter (Signed)
Chosen in error

## 2011-03-12 ENCOUNTER — Ambulatory Visit (HOSPITAL_COMMUNITY)
Admission: RE | Admit: 2011-03-12 | Discharge: 2011-03-12 | Disposition: A | Discharge status: Home / Self Care | Payer: Medicare Other | Source: Ambulatory Visit | Attending: *Deleted | Admitting: *Deleted

## 2011-03-12 NOTE — Progress Notes (Signed)
Occupational Therapy Treatment  Patient Name: Yesenia Montgomery MRN: [id number] Today's Date: 03/12/2011       Time in:  11:07  Time out:  11:59  HPI: Symptoms/Limitations Symptoms: it feels about the same. it feels like it wants to pop. Pain Assessment Currently in Pain?: Yes Pain Score:   4 Pain Location: Elbow Pain Orientation: Right Pain Type: Chronic pain Pain Onset: More than a month ago Pain Frequency: Intermittent  Precautions/Restrictions    Mobility       Exercise/Treatments Cervical Exercises UBE (Upper Arm Bike): 3 minutes forward, 3 minutes backwards (5.0 resistance) Shoulder Exercises Elbow Flexion: PROM;Strengthening;15 reps (5#) Elbow Extension: PROM;Strengthening;15 reps (5#) Forearm Supination: PROM;Strengthening;15 reps (5#) Forearm Pronation: PROM;Strengthening;15 reps (5#) Wrist Flexion: PROM;Strengthening;15 reps (5#) Wrist Extension: PROM;Strengthening;15 reps (5#) Additional ROM/Strengthening Exercises UBE (Upper Arm Bike): 3 minutes forward, 3 minutes backwards (5.0 resistance) Wall Pushups/Modified Pushups: 20 Elbow Exercises Elbow Flexion: PROM;Strengthening;15 reps (5#) Elbow Extension: PROM;Strengthening;15 reps (5#) Forearm Supination: PROM;Strengthening;15 reps (5#) Forearm Pronation: PROM;Strengthening;15 reps (5#) Wrist Flexion: PROM;Strengthening;15 reps (5#) Wrist Extension: PROM;Strengthening;15 reps (5#) Additional Elbow Exercises UBE (Upper Arm Bike): 3 minutes forward, 3 minutes backwards (5.0 resistance) Wall Pushups/Modified Pushups: 20 Theraputty - Flatten: blue Theraputty - Roll: blue Theraputty - Grip: blue Hand Gripper with Small Beads: 10 small 10 xsmall Wrist Exercises Forearm Supination: PROM;Strengthening;15 reps (5#) Forearm Pronation: PROM;Strengthening;15 reps (5#) Wrist Flexion: PROM;Strengthening;15 reps (5#) Wrist Extension: PROM;Strengthening;15 reps (5#) Additional Wrist Exercises Theraputty - Flatten:  blue Theraputty - Roll: blue Theraputty - Grip: blue Hand Gripper with Small Beads: 10 small 10 xsmall Hand Exercises Theraputty - Flatten: blue Theraputty - Roll: blue Theraputty - Grip: blue Hand Gripper with Small Beads: 10 small 10 xsmall Neurological Re-education Exercises Elbow Flexion: PROM;Strengthening;15 reps (5#) Elbow Extension: PROM;Strengthening;15 reps (5#) Forearm Supination: PROM;Strengthening;15 reps (5#) Forearm Pronation: PROM;Strengthening;15 reps (5#) Wrist Flexion: PROM;Strengthening;15 reps (5#) Wrist Extension: PROM;Strengthening;15 reps (5#) Hand Gripper with Small Beads: 10 small 10 xsmall Grasp and Release Theraputty - Flatten: blue Theraputty - Roll: blue Theraputty - Grip: blue Work Immunologist UBE (Upper Arm Bike): 3 minutes forward, 3 minutes backwards (5.0 resistance) Manual Therapy Manual Therapy: Myofascial release Myofascial Release: 11:07-11:24   Goals Long Term Goals Long Term Goal 1 Progress: Progressing toward goal Long Term Goal 2 Progress: Progressing toward goal Long Term Goal 3 Progress: Progressing toward goal Long Term Goal 4 Progress: Progressing toward goal Long Term Goal 5 Progress: Progressing toward goal Long Term Goal 6: progressing toward goals End of Session Patient Active Problem List  Diagnoses  . OSTEOARTHRITIS, KNEES, BILATERAL, SEVERE  . Closed fracture of head of radius  . Muscle weakness (generalized)  . Pain in joint, forearm   OT Assessment and Plan Clinical Impression Statement: increased ease with exercise today; tolerated PROM well today.  Patient had a pop in elbow during elbow ext with 5# which brought patient some relief.  Pain a 2/10 at end of session OT Plan: reassess next week.   Noralee Stain, Aveyah Greenwood L 03/12/2011, 12:02 PM  Visit 24 of 25

## 2011-03-17 ENCOUNTER — Ambulatory Visit (HOSPITAL_COMMUNITY)
Admission: RE | Admit: 2011-03-17 | Discharge: 2011-03-17 | Disposition: A | Discharge status: Home / Self Care | Payer: Medicare Other | Source: Ambulatory Visit | Attending: Orthopaedic Surgery | Admitting: Orthopaedic Surgery

## 2011-03-17 NOTE — Progress Notes (Signed)
Occupational Therapy Treatment  Patient Name: Yesenia Montgomery MRN: [id number] Today's Date: 03/17/2011  Time in:  10:18 to 11:20 Reassess on 7/2-/2012  HPI: Symptoms/Limitations Symptoms: the doctor wanted to know who much more therapy I have, the doctor said I might be like this, I have been working it hard Pain Assessment Currently in Pain?: Yes Pain Score:   3 Pain Location: Elbow Pain Orientation: Right Pain Type: Chronic pain Pain Onset: More than a month ago Pain Frequency: Intermittent  Precautions/Restrictions    Mobility       Exercise/Treatments Cervical Exercises UBE (Upper Arm Bike): 3 minutes forward, 3 minutes backwards (5.0 resistance) Shoulder Exercises Elbow Flexion: PROM;Strengthening;15 reps (5#) Bar Weights/Barbell (Elbow Flexion): 5 lbs Elbow Extension: PROM;Strengthening;15 reps Bar Weights/Barbell (Elbow Extension): 5 lbs Forearm Supination: PROM;Strengthening;15 reps (5#) Forearm Pronation: PROM;Strengthening;15 reps (5#) Wrist Flexion: PROM;Strengthening;15 reps (5#) Wrist Extension: PROM;Strengthening;15 reps (5#) Additional ROM/Strengthening Exercises UBE (Upper Arm Bike): 3 minutes forward, 3 minutes backwards (5.0 resistance) Wall Pushups/Modified Pushups: 20 Rebounder: 20 red Elbow Exercises Elbow Flexion: PROM;Strengthening;15 reps (5#) Bar Weights/Barbell (Elbow Flexion): 5 lbs Elbow Extension: PROM;Strengthening;15 reps Bar Weights/Barbell (Elbow Extension): 5 lbs Forearm Supination: PROM;Strengthening;15 reps (5#) Forearm Pronation: PROM;Strengthening;15 reps (5#) Wrist Flexion: PROM;Strengthening;15 reps (5#) Wrist Extension: PROM;Strengthening;15 reps (5#) Additional Elbow Exercises UBE (Upper Arm Bike): 3 minutes forward, 3 minutes backwards (5.0 resistance) Wall Pushups/Modified Pushups: 20 Rebounder: 20 red Theraputty - Flatten: blue Theraputty - Roll: blue Theraputty - Grip: blue Hand Gripper with Small Beads: 10 small 10  xsmall Wrist Exercises Forearm Supination: PROM;Strengthening;15 reps (5#) Forearm Pronation: PROM;Strengthening;15 reps (5#) Wrist Flexion: PROM;Strengthening;15 reps (5#) Wrist Extension: PROM;Strengthening;15 reps (5#) Additional Wrist Exercises Theraputty - Flatten: blue Theraputty - Roll: blue Theraputty - Grip: blue Hand Gripper with Small Beads: 10 small 10 xsmall Hand Exercises Theraputty - Flatten: blue Theraputty - Roll: blue Theraputty - Grip: blue Hand Gripper with Small Beads: 10 small 10 xsmall Neurological Re-education Exercises Elbow Flexion: PROM;Strengthening;15 reps (5#) Bar Weights/Barbell (Elbow Flexion): 5 lbs Elbow Extension: PROM;Strengthening;15 reps Bar Weights/Barbell (Elbow Extension): 5 lbs Forearm Supination: PROM;Strengthening;15 reps (5#) Forearm Pronation: PROM;Strengthening;15 reps (5#) Wrist Flexion: PROM;Strengthening;15 reps (5#) Wrist Extension: PROM;Strengthening;15 reps (5#) Hand Gripper with Small Beads: 10 small 10 xsmall Grasp and Release Theraputty - Flatten: blue Theraputty - Roll: blue Theraputty - Grip: blue Work Immunologist UBE (Upper Arm Bike): 3 minutes forward, 3 minutes backwards (5.0 resistance) Manual Therapy Manual Therapy: Myofascial release Myofascial Release: to 10:36   Goals Long Term Goals Long Term Goal 1 Progress: Progressing toward goal Long Term Goal 2 Progress: Progressing toward goal Long Term Goal 3 Progress: Progressing toward goal End of Session Patient Active Problem List  Diagnoses  . OSTEOARTHRITIS, KNEES, BILATERAL, SEVERE  . Closed fracture of head of radius  . Muscle weakness (generalized)  . Pain in joint, forearm   End of Session Activity Tolerance: Patient tolerated treatment well OT Assessment and Plan Clinical Impression Statement: tolerated increased stretch to elbow to day. Prognosis: Good OT Plan: reassess   Vaughan Browner 03/17/2011, 4:32 PM

## 2011-03-19 ENCOUNTER — Ambulatory Visit (HOSPITAL_COMMUNITY): Payer: Medicare Other | Admitting: Specialist

## 2011-03-20 ENCOUNTER — Ambulatory Visit (HOSPITAL_COMMUNITY)
Admission: RE | Admit: 2011-03-20 | Discharge: 2011-03-20 | Disposition: A | Discharge status: Home / Self Care | Payer: Medicare Other | Source: Ambulatory Visit | Attending: Orthopaedic Surgery | Admitting: Orthopaedic Surgery

## 2011-03-20 NOTE — Progress Notes (Signed)
Occupational Therapy Treatment  Patient Name: Yesenia Montgomery MRN: [id number] Today's Date: 03/20/2011  . OT Time in: 11:13         Time out:  11:53    Manual Therapy: 11:13-1144 Visit   Reassessment date:N/A D/C TODAY     HPI: Symptoms/Limitations Symptoms: it feels ok Pain Assessment Currently in Pain?: No/denies Pain Score: 0-No pain  Precautions/Restrictions    Mobility       Exercise/Treatments Additional Elbow Exercises Theraputty - Flatten: blue Theraputty - Roll: blue Theraputty - Grip: blue Additional Wrist Exercises Theraputty - Flatten: blue Theraputty - Roll: blue Theraputty - Grip: blue Hand Exercises Theraputty - Flatten: blue Theraputty - Roll: blue Theraputty - Grip: blue Grasp and Release Theraputty - Flatten: blue Theraputty - Roll: blue Theraputty - Grip: blue Manual Therapy Manual Therapy: Myofascial release Myofascial Release: 11:13-   Goals Long Term Goals Long Term Goal 1 Progress: Met Long Term Goal 2 Progress: Met Long Term Goal 3 Progress: Met Long Term Goal 4 Progress: Met Long Term Goal 5 Progress: Partly met (pain will increase to 7/10 when picking up dog  or heavy ite) End of Session Patient Active Problem List  Diagnoses  . OSTEOARTHRITIS, KNEES, BILATERAL, SEVERE  . Closed fracture of head of radius  . Muscle weakness (generalized)  . Pain in joint, forearm   End of Session Activity Tolerance: Patient tolerated treatment well OT Assessment and Plan Clinical Impression Statement: see progress note Prognosis: Good OT Plan: d/c to [facility]P   Sammye Staff L 03/20/2011, 11:58 AM

## 2011-03-24 ENCOUNTER — Ambulatory Visit (HOSPITAL_COMMUNITY): Payer: Medicare Other | Admitting: Occupational Therapy

## 2011-03-26 ENCOUNTER — Ambulatory Visit (HOSPITAL_COMMUNITY): Payer: Medicare Other | Admitting: Occupational Therapy

## 2011-07-06 ENCOUNTER — Telehealth: Payer: Self-pay | Admitting: Orthopedic Surgery

## 2011-07-06 NOTE — Telephone Encounter (Signed)
Medical records (office notes dates of service 05/07/10 and 10/21/10) faxed as per signed release authorization to Dr. Darreld Mclean to fax [phone].

## 2012-03-02 ENCOUNTER — Other Ambulatory Visit: Payer: Self-pay | Admitting: Orthopedic Surgery

## 2014-01-09 ENCOUNTER — Emergency Department (HOSPITAL_COMMUNITY): Payer: PRIVATE HEALTH INSURANCE

## 2014-01-09 ENCOUNTER — Emergency Department (HOSPITAL_COMMUNITY)
Admission: EM | Admit: 2014-01-09 | Discharge: 2014-01-09 | Disposition: A | Discharge status: Home / Self Care | Payer: PRIVATE HEALTH INSURANCE | Attending: Emergency Medicine | Admitting: Emergency Medicine

## 2014-01-09 ENCOUNTER — Encounter (HOSPITAL_COMMUNITY): Payer: Self-pay | Admitting: Emergency Medicine

## 2014-01-09 DIAGNOSIS — Y9389 Activity, other specified: Secondary | ICD-10-CM | POA: Insufficient documentation

## 2014-01-09 DIAGNOSIS — Y92410 Unspecified street and highway as the place of occurrence of the external cause: Secondary | ICD-10-CM | POA: Insufficient documentation

## 2014-01-09 DIAGNOSIS — Z87891 Personal history of nicotine dependence: Secondary | ICD-10-CM | POA: Diagnosis not present

## 2014-01-09 DIAGNOSIS — T148XXA Other injury of unspecified body region, initial encounter: Secondary | ICD-10-CM | POA: Insufficient documentation

## 2014-01-09 DIAGNOSIS — S8000XA Contusion of unspecified knee, initial encounter: Secondary | ICD-10-CM

## 2014-01-09 DIAGNOSIS — Z791 Long term (current) use of non-steroidal anti-inflammatories (NSAID): Secondary | ICD-10-CM | POA: Insufficient documentation

## 2014-01-09 DIAGNOSIS — F411 Generalized anxiety disorder: Secondary | ICD-10-CM | POA: Diagnosis not present

## 2014-01-09 DIAGNOSIS — S199XXA Unspecified injury of neck, initial encounter: Secondary | ICD-10-CM | POA: Diagnosis present

## 2014-01-09 DIAGNOSIS — S0993XA Unspecified injury of face, initial encounter: Secondary | ICD-10-CM | POA: Diagnosis present

## 2014-01-09 DIAGNOSIS — F419 Anxiety disorder, unspecified: Secondary | ICD-10-CM

## 2014-01-09 MED ORDER — IBUPROFEN 800 MG PO TABS: 800.0000 mg | ORAL_TABLET | Freq: Three times a day (TID) | ORAL | Status: DC

## 2014-01-09 MED ORDER — DIAZEPAM 5 MG PO TABS: 5.0000 mg | ORAL_TABLET | Freq: Three times a day (TID) | ORAL | Status: DC | PRN
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
## 2014-01-09 NOTE — Discharge Instructions (Signed)
Contusion A contusion is a deep bruise. Contusions happen when an injury causes bleeding under the skin. Signs of bruising include pain, puffiness (swelling), and discolored skin. The contusion may turn blue, purple, or yellow. HOME CARE   Put ice on the injured area.  Put ice in a plastic bag.  Place a towel between your skin and the bag.  Leave the ice on for 15-20 minutes, 03-04 times a day.  Only take medicine as told by your doctor.  Rest the injured area.  If possible, raise (elevate) the injured area to lessen puffiness. GET HELP RIGHT AWAY IF:   You have more bruising or puffiness.  You have pain that is getting worse.  Your puffiness or pain is not helped by medicine. MAKE SURE YOU:   Understand these instructions.  Will watch your condition.  Will get help right away if you are not doing well or get worse. Document Released: 02/03/2008 Document Revised: 11/09/2011 Document Reviewed: 06/22/2011 [facility] Patient Information 2014 [place], [place].  Muscle Strain A muscle strain (pulled muscle) happens when a muscle is stretched beyond normal length. It happens when a sudden, violent force stretches your muscle too far. Usually, a few of the fibers in your muscle are torn. Muscle strain is common in athletes. Recovery usually takes 1 2 weeks. Complete healing takes 5 6 weeks.  HOME CARE   Follow the PRICE method of treatment to help your injury get better. Do this the first 2 3 days after the injury:  Protect. Protect the muscle to keep it from getting injured again.  Rest. Limit your activity and rest the injured body part.  Ice. Put ice in a plastic bag. Place a towel between your skin and the bag. Then, apply the ice and leave it on from 15 20 minutes each hour. After the third day, switch to moist heat packs.  Compression. Use a splint or elastic bandage on the injured area for comfort. Do not put it on too tightly.  Elevate. Keep the injured body part above  the level of your heart.  Only take medicine as told by your doctor.  Warm up before doing exercise to prevent future muscle strains. GET HELP IF:   You have more pain or puffiness (swelling) in the injured area.  You feel numbness, tingling, or notice a loss of strength in the injured area. MAKE SURE YOU:   Understand these instructions.  Will watch your condition.  Will get help right away if you are not doing well or get worse. Document Released: 05/26/2008 Document Revised: 06/07/2013 Document Reviewed: 03/16/2013 [facility] Patient Information 2014 [place], [place].
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
## 2015-11-05 ENCOUNTER — Emergency Department (HOSPITAL_COMMUNITY): Payer: Medicare Other

## 2015-11-05 ENCOUNTER — Emergency Department (HOSPITAL_COMMUNITY)
Admission: EM | Admit: 2015-11-05 | Discharge: 2015-11-05 | Disposition: A | Discharge status: Home / Self Care | Payer: Medicare Other | Attending: Emergency Medicine | Admitting: Emergency Medicine

## 2015-11-05 ENCOUNTER — Encounter (HOSPITAL_COMMUNITY): Payer: Self-pay | Admitting: Emergency Medicine

## 2015-11-05 DIAGNOSIS — Z791 Long term (current) use of non-steroidal anti-inflammatories (NSAID): Secondary | ICD-10-CM | POA: Diagnosis not present

## 2015-11-05 DIAGNOSIS — Z87891 Personal history of nicotine dependence: Secondary | ICD-10-CM | POA: Diagnosis not present

## 2015-11-05 DIAGNOSIS — J069 Acute upper respiratory infection, unspecified: Secondary | ICD-10-CM | POA: Diagnosis not present

## 2015-11-05 DIAGNOSIS — R05 Cough: Secondary | ICD-10-CM | POA: Diagnosis present

## 2015-11-05 HISTORY — DX: Unspecified osteoarthritis, unspecified site: M19.90

## 2015-11-05 MED ORDER — BENZONATATE 100 MG PO CAPS
ORAL_CAPSULE | ORAL | Status: AC
  Filled 2015-11-05: qty 1

## 2015-11-05 MED ORDER — BENZONATATE 100 MG PO CAPS
200.0000 mg | ORAL_CAPSULE | Freq: Once | ORAL | Status: AC
  Administered 2015-11-05: 200 mg via ORAL

## 2015-11-05 MED ORDER — BENZONATATE 100 MG PO CAPS: 200.0000 mg | ORAL_CAPSULE | Freq: Three times a day (TID) | ORAL | Status: DC | PRN

## 2015-11-05 MED ORDER — PROMETHAZINE-CODEINE 6.25-10 MG/5ML PO SYRP: 5.0000 mL | ORAL_SOLUTION | ORAL | Status: DC | PRN

## 2015-11-05 NOTE — ED Provider Notes (Signed)
CSN: [id number]     Arrival date & time 11/05/15  1144 History   First MD Initiated Contact with Patient 11/05/15 1329     Chief Complaint  Patient presents with  . Cough     (Consider location/radiation/quality/duration/timing/severity/associated sxs/prior Treatment) The history is provided by the patient and the spouse.   Yesenia Montgomery is a 60 y.o. female presenting with a 1 day history of uri type symptoms which includes nasal congestion with clear rhinorrhea,  low grade fever with tmax 100.6, chills  and nonproductive cough.  Symptoms do not include shortness of breath, chest pain,  Nausea, vomiting or diarrhea.  The patient has taken a generic daytime cold medicine prior to arrival with no significant improvement in symptoms.      Past Medical History  Diagnosis Date  . Arthritis    Past Surgical History  Procedure Laterality Date  . Tonsillectomy     No family history on file. Social History  Substance Use Topics  . Smoking status: Former Research scientist (life sciences)  . Smokeless tobacco: None  . Alcohol Use: No   OB History    No data available     Review of Systems  Constitutional: Positive for fever and chills.  HENT: Positive for congestion and rhinorrhea. Negative for ear pain, sinus pressure, sore throat, trouble swallowing and voice change.   Eyes: Negative for discharge.  Respiratory: Positive for cough. Negative for shortness of breath, wheezing and stridor.   Cardiovascular: Negative for chest pain.  Gastrointestinal: Negative for nausea, abdominal pain and diarrhea.  Genitourinary: Negative.       Allergies  Review of patient's allergies indicates no known allergies.  Home Medications   Prior to Admission medications   Medication Sig Start Date End Date Taking? Authorizing Provider  benzonatate (TESSALON) 100 MG capsule Take 2 capsules (200 mg total) by mouth 3 (three) times daily as needed. 11/05/15   Evalee Jefferson, PA-C  diazepam (VALIUM) 5 MG tablet Take 1 tablet (5  mg total) by mouth every 8 (eight) hours as needed for anxiety (spasms). 01/09/14   Orpah Greek, MD  diclofenac (VOLTAREN) 50 MG EC tablet TAKE ONE TABLET BY MOUTH TWICE DAILY 03/02/12   Carole Civil, MD  ibuprofen (ADVIL,MOTRIN) 800 MG tablet Take 1 tablet (800 mg total) by mouth 3 (three) times daily. 01/09/14   Orpah Greek, MD  promethazine-codeine (PHENERGAN WITH CODEINE) 6.25-10 MG/5ML syrup Take 5 mLs by mouth every 4 (four) hours as needed for cough. 11/05/15   Evalee Jefferson, PA-C   BP 171/99 mmHg  Pulse 92  Temp(Src) 99.6 F (37.6 C) (Oral)  Resp 18  Ht 5\' 9"  (1.753 m)  Wt 120.657 kg  BMI 39.26 kg/m2  SpO2 97% Physical Exam  Constitutional: She is oriented to person, place, and time. She appears well-developed and well-nourished.  HENT:  Head: Normocephalic and atraumatic.  Right Ear: Tympanic membrane and ear canal normal.  Left Ear: Tympanic membrane and ear canal normal.  Nose: Mucosal edema and rhinorrhea present.  Mouth/Throat: Uvula is midline, oropharynx is clear and moist and mucous membranes are normal. No oropharyngeal exudate, posterior oropharyngeal edema, posterior oropharyngeal erythema or tonsillar abscesses.  Eyes: Conjunctivae are normal.  Neck: Normal range of motion. Neck supple.  Cardiovascular: Normal rate and normal heart sounds.   Pulmonary/Chest: Effort normal. No respiratory distress. She has no wheezes. She has no rales.  Abdominal: Soft. There is no tenderness. There is no guarding.  Musculoskeletal: Normal range of motion.  Neurological: She is alert and oriented to person, place, and time.  Skin: Skin is warm and dry. No rash noted.  Psychiatric: She has a normal mood and affect.    ED Course  Procedures (including critical care time) Labs Review Labs Reviewed - No data to display  Imaging Review Dg Chest 2 View  11/05/2015  CLINICAL DATA:  Cough, chills, fever today EXAM: CHEST  2 VIEW COMPARISON:  01/13/2010 FINDINGS:  Normal heart size. Lungs clear. No pneumothorax. No pleural effusion. IMPRESSION: No active cardiopulmonary disease. Electronically Signed   By: Marybelle Killings M.D.   On: 11/05/2015 13:09   I have personally reviewed and evaluated these images and lab results as part of my medical decision-making.   EKG Interpretation None      MDM   Final diagnoses:  Viral URI    Pt in no acute distress. Prescribed tessalon and phenergan/codeine for cough.  Advised recheck, increase fluid intake, advised coricidin brand cough and cold med to avoid bp elevation. Prn f/u with pcp if sx worsen or fail to improve.    Evalee Jefferson, PA-C 11/05/15 2219  Virgel Manifold, MD 11/06/15 [phone]

## 2015-11-05 NOTE — Discharge Instructions (Signed)
Upper Respiratory Infection, Adult Most upper respiratory infections (URIs) are a viral infection of the air passages leading to the lungs. A URI affects the nose, throat, and upper air passages. The most common type of URI is nasopharyngitis and is typically referred to as "the common cold." URIs run their course and usually go away on their own. Most of the time, a URI does not require medical attention, but sometimes a bacterial infection in the upper airways can follow a viral infection. This is called a secondary infection. Sinus and middle ear infections are common types of secondary upper respiratory infections. Bacterial pneumonia can also complicate a URI. A URI can worsen asthma and chronic obstructive pulmonary disease (COPD). Sometimes, these complications can require emergency medical care and may be life threatening.  CAUSES Almost all URIs are caused by viruses. A virus is a type of germ and can spread from one person to another.  RISKS FACTORS You may be at risk for a URI if:   You smoke.   You have chronic heart or lung disease.  You have a weakened defense (immune) system.   You are very young or very old.   You have nasal allergies or asthma.  You work in crowded or poorly ventilated areas.  You work in health care facilities or schools. SIGNS AND SYMPTOMS  Symptoms typically develop 2-3 days after you come in contact with a cold virus. Most viral URIs last 7-10 days. However, viral URIs from the influenza virus (flu virus) can last 14-18 days and are typically more severe. Symptoms may include:   Runny or stuffy (congested) nose.   Sneezing.   Cough.   Sore throat.   Headache.   Fatigue.   Fever.   Loss of appetite.   Pain in your forehead, behind your eyes, and over your cheekbones (sinus pain).  Muscle aches.  DIAGNOSIS  Your health care provider may diagnose a URI by:  Physical exam.  Tests to check that your symptoms are not due to  another condition such as:  Strep throat.  Sinusitis.  Pneumonia.  Asthma. TREATMENT  A URI goes away on its own with time. It cannot be cured with medicines, but medicines may be prescribed or recommended to relieve symptoms. Medicines may help:  Reduce your fever.  Reduce your cough.  Relieve nasal congestion. HOME CARE INSTRUCTIONS   Take medicines only as directed by your health care provider.   Gargle warm saltwater or take cough drops to comfort your throat as directed by your health care provider.  Use a warm mist humidifier or inhale steam from a shower to increase air moisture. This may make it easier to breathe.  Drink enough fluid to keep your urine clear or pale yellow.   Eat soups and other clear broths and maintain good nutrition.   Rest as needed.   Return to work when your temperature has returned to normal or as your health care provider advises. You may need to stay home longer to avoid infecting others. You can also use a face mask and careful hand washing to prevent spread of the virus.  Increase the usage of your inhaler if you have asthma.   Do not use any tobacco products, including cigarettes, chewing tobacco, or electronic cigarettes. If you need help quitting, ask your health care provider. PREVENTION  The best way to protect yourself from getting a cold is to practice good hygiene.   Avoid oral or hand contact with people with cold   symptoms.   Wash your hands often if contact occurs.  There is no clear evidence that vitamin C, vitamin E, echinacea, or exercise reduces the chance of developing a cold. However, it is always recommended to get plenty of rest, exercise, and practice good nutrition.  SEEK MEDICAL CARE IF:   You are getting worse rather than better.   Your symptoms are not controlled by medicine.   You have chills.  You have worsening shortness of breath.  You have brown or red mucus.  You have yellow or brown nasal  discharge.  You have pain in your face, especially when you bend forward.  You have a fever.  You have swollen neck glands.  You have pain while swallowing.  You have white areas in the back of your throat. SEEK IMMEDIATE MEDICAL CARE IF:   You have severe or persistent:  Headache.  Ear pain.  Sinus pain.  Chest pain.  You have chronic lung disease and any of the following:  Wheezing.  Prolonged cough.  Coughing up blood.  A change in your usual mucus.  You have a stiff neck.  You have changes in your:  Vision.  Hearing.  Thinking.  Mood. MAKE SURE YOU:   Understand these instructions.  Will watch your condition.  Will get help right away if you are not doing well or get worse.   This information is not intended to replace advice given to you by your health care provider. Make sure you discuss any questions you have with your health care provider.   Document Released: 02/10/2001 Document Revised: 01/01/2015 Document Reviewed: 11/22/2013 [employer] Interactive Patient Education 2016 [employer]  

## 2015-11-05 NOTE — ED Notes (Signed)
Cough, fever, chills since yesterday. States she took daytime cold medicine, last dose 1:00 am

## 2016-03-18 ENCOUNTER — Other Ambulatory Visit: Payer: Self-pay | Admitting: Family Medicine

## 2016-03-18 DIAGNOSIS — R928 Other abnormal and inconclusive findings on diagnostic imaging of breast: Secondary | ICD-10-CM

## 2016-03-31 ENCOUNTER — Other Ambulatory Visit (HOSPITAL_COMMUNITY): Payer: Self-pay | Admitting: Family Medicine

## 2016-03-31 DIAGNOSIS — R928 Other abnormal and inconclusive findings on diagnostic imaging of breast: Secondary | ICD-10-CM

## 2016-04-07 ENCOUNTER — Other Ambulatory Visit: Payer: Self-pay | Admitting: Family Medicine

## 2016-04-07 ENCOUNTER — Ambulatory Visit (HOSPITAL_COMMUNITY)
Admission: RE | Admit: 2016-04-07 | Discharge: 2016-04-07 | Disposition: A | Discharge status: Home / Self Care | Payer: Medicare Other | Source: Ambulatory Visit | Attending: Family Medicine | Admitting: Family Medicine

## 2016-04-07 DIAGNOSIS — N63 Unspecified lump in unspecified breast: Secondary | ICD-10-CM

## 2016-04-07 DIAGNOSIS — N6310 Unspecified lump in the right breast, unspecified quadrant: Secondary | ICD-10-CM

## 2016-04-07 DIAGNOSIS — R928 Other abnormal and inconclusive findings on diagnostic imaging of breast: Secondary | ICD-10-CM

## 2016-04-07 DIAGNOSIS — R921 Mammographic calcification found on diagnostic imaging of breast: Secondary | ICD-10-CM | POA: Diagnosis not present

## 2016-04-21 ENCOUNTER — Encounter (HOSPITAL_COMMUNITY): Payer: Medicare Other

## 2016-04-21 ENCOUNTER — Ambulatory Visit
Admission: RE | Admit: 2016-04-21 | Discharge: 2016-04-21 | Disposition: A | Discharge status: Home / Self Care | Payer: Medicare Other | Source: Ambulatory Visit | Attending: Family Medicine | Admitting: Family Medicine

## 2016-04-21 ENCOUNTER — Other Ambulatory Visit (HOSPITAL_COMMUNITY): Payer: Medicare Other

## 2016-04-21 DIAGNOSIS — N6310 Unspecified lump in the right breast, unspecified quadrant: Secondary | ICD-10-CM

## 2016-04-21 DIAGNOSIS — R921 Mammographic calcification found on diagnostic imaging of breast: Secondary | ICD-10-CM

## 2016-04-21 DIAGNOSIS — N63 Unspecified lump in unspecified breast: Secondary | ICD-10-CM

## 2016-06-11 ENCOUNTER — Other Ambulatory Visit (HOSPITAL_COMMUNITY): Payer: Self-pay | Admitting: General Surgery

## 2016-06-11 DIAGNOSIS — R590 Localized enlarged lymph nodes: Secondary | ICD-10-CM

## 2016-06-15 ENCOUNTER — Ambulatory Visit (HOSPITAL_COMMUNITY)
Admission: RE | Admit: 2016-06-15 | Discharge: 2016-06-15 | Disposition: A | Discharge status: Home / Self Care | Payer: Medicare Other | Source: Ambulatory Visit | Attending: General Surgery | Admitting: General Surgery

## 2016-06-15 DIAGNOSIS — R590 Localized enlarged lymph nodes: Secondary | ICD-10-CM | POA: Insufficient documentation

## 2017-01-06 LAB — FECAL OCCULT BLOOD, GUAIAC: Fecal Occult Blood: NEGATIVE

## 2017-03-04 ENCOUNTER — Encounter: Payer: Self-pay | Admitting: Family Medicine

## 2017-03-04 ENCOUNTER — Ambulatory Visit (INDEPENDENT_AMBULATORY_CARE_PROVIDER_SITE_OTHER): Payer: Medicare Other | Admitting: Family Medicine

## 2017-03-04 DIAGNOSIS — E66812 Obesity, class 2: Secondary | ICD-10-CM | POA: Insufficient documentation

## 2017-03-04 DIAGNOSIS — I10 Essential (primary) hypertension: Secondary | ICD-10-CM

## 2017-03-04 DIAGNOSIS — R739 Hyperglycemia, unspecified: Secondary | ICD-10-CM | POA: Diagnosis not present

## 2017-03-04 LAB — CBC
HCT: 40.4 % (ref 35.0–45.0)
HEMOGLOBIN: 13.1 g/dL (ref 11.7–15.5)
MCH: 29 pg (ref 27.0–33.0)
MCHC: 32.4 g/dL (ref 32.0–36.0)
MCV: 89.6 fL (ref 80.0–100.0)
MPV: 11.1 fL (ref 7.5–12.5)
Platelets: 289 10*3/uL (ref 140–400)
RBC: 4.51 MIL/uL (ref 3.80–5.10)
RDW: 13.5 % (ref 11.0–15.0)
WBC: 4.6 10*3/uL (ref 3.8–10.8)

## 2017-03-04 LAB — COMPLETE METABOLIC PANEL WITH GFR
ALBUMIN: 4.3 g/dL (ref 3.6–5.1)
ALK PHOS: 57 U/L (ref 33–130)
ALT: 24 U/L (ref 6–29)
AST: 24 U/L (ref 10–35)
BILIRUBIN TOTAL: 0.6 mg/dL (ref 0.2–1.2)
BUN: 21 mg/dL (ref 7–25)
CO2: 28 mmol/L (ref 20–31)
CREATININE: 0.9 mg/dL (ref 0.50–0.99)
Calcium: 9.9 mg/dL (ref 8.6–10.4)
Chloride: 99 mmol/L (ref 98–110)
GFR, Est African American: 79 mL/min (ref >=60)
GFR, Est Non African American: 69 mL/min (ref >=60)
GLUCOSE: 89 mg/dL (ref 65–99)
Potassium: 4.1 mmol/L (ref 3.5–5.3)
SODIUM: 137 mmol/L (ref 135–146)
TOTAL PROTEIN: 7.7 g/dL (ref 6.1–8.1)

## 2017-03-04 LAB — LIPID PANEL
CHOLESTEROL: 175 mg/dL (ref <200)
HDL: 33 mg/dL — ABNORMAL LOW (ref >50)
LDL CALC: 96 mg/dL (ref <100)
Total CHOL/HDL Ratio: 5.3 Ratio — ABNORMAL HIGH (ref <5.0)
Triglycerides: 231 mg/dL — ABNORMAL HIGH (ref <150)
VLDL: 46 mg/dL — AB (ref <30)

## 2017-03-04 NOTE — Patient Instructions (Addendum)
Need records from Dr Wenda Overland Need records from [place] work on the diet and exercise Bring your BP cuff next visit  I have ordered screening lab testing I will send you a letter with your test results.  If there is anything of concern, we will call right away.  See me in a month

## 2017-03-04 NOTE — Progress Notes (Signed)
Chief Complaint  Patient presents with  . Follow-up   This is a new patient to the practice. She was previously followed by Dr. Wenda Overland in [place]. These records will be requested. She has hypertension. It is not well controlled. She brings in records from home. These show blood pressure that is consistently low. She will bring in her blood pressure cuff at her next visit. She has no headaches or dizzy spells. She has been on her current blood pressure medicine for years. She was placed on metformin for elevated blood sugar. She insists that she is not diabetic. She thinks her doctor put her on this prematurely. She has lost weight and is exercising more, hoping to go off of the metformin. I will get updated lab work to see what her blood sugar and A1c are. Patient has morbid obesity. She states she is on a diet and exercise program. [facility]. She is trying to eat more fresh fruit and vegetables. She has lost 20 pounds in the last year. She is congratulated on this effort. She has bilateral osteoarthritis of the knees. This bothers her intermittently. She takes over-the-counter medications as needed, also turmeric for the inflammation. She is on multiple vitamins and supplements. She states her mammogram was a year ago. She states it was abnormal and required biopsy. She thinks she is due for another. I will request those records. She hasn't had a Pap smear in many years. She has yearly stool testing done instead of colonoscopy. She brings in a copy of her most recent  FIT test. It is negative She refuses immunizations   Patient Active Problem List   Diagnosis Date Noted  . Essential hypertension 03/04/2017  . Morbid obesity ([place]) 03/04/2017  . Elevated blood sugar level 03/04/2017  . OSTEOARTHRITIS, KNEES, BILATERAL, SEVERE 05/07/2010    Outpatient Encounter Prescriptions as of 03/04/2017  Medication Sig  . atenolol-chlorthalidone (TENORETIC) 50-25 MG  tablet Take 1 tablet by mouth daily.  . cholecalciferol (VITAMIN D) 1000 units tablet Take 2,000 Units by mouth daily.  . Cinnamon 500 MG capsule Take 1,000 mg by mouth daily.  . Ginkgo 60 MG TABS Take by mouth.  . metFORMIN (GLUCOPHAGE-XR) 500 MG 24 hr tablet Take 500 mg by mouth daily.  . Misc Natural Products (TART CHERRY ADVANCED PO) Take by mouth.  . Potassium 99 MG TABS Take by mouth.  . Turmeric 500 MG CAPS Take by mouth.  . vitamin A 10000 UNIT capsule Take 2,400 Units by mouth daily.  . vitamin C (ASCORBIC ACID) 500 MG tablet Take 500 mg by mouth daily.  . vitamin E 400 UNIT capsule Take 400 Units by mouth daily.   No facility-administered encounter medications on file as of 03/04/2017.     Past Medical History:  Diagnosis Date  . Arthritis    knees  . Hypertension     Past Surgical History:  Procedure Laterality Date  . TONSILLECTOMY      Social History   Social History  . Marital status: Widowed    Spouse name: N/A  . Number of children: 2  . Years of education: 12   Occupational History  . 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 Smoking status: Former Research scientist (life sciences)  . Smokeless tobacco: Never Used  . Alcohol use No  . Drug use: No  . Sexual activity: Not Currently     Comment: widow  Other Topics Concern  . Not on file   Social History Narrative   Lives alone   Children in [place]   Has lived in [place] since 2009 - husband was a Set designer to [employer], swims   reads at [employer]       Family History  Problem Relation Age of Onset  . Stroke Mother   . Heart disease Mother   . Hypertension Mother   . Hyperlipidemia Mother   . Early death Father        bedridden by age of 72 ?  Marland Kitchen Alcohol abuse Father   . Cancer Paternal Aunt   . Diabetes Cousin     Review of Systems  Constitutional: Negative for chills, fever and weight loss.  HENT: Negative for congestion and hearing loss.   Eyes: Negative for  blurred vision and pain.  Respiratory: Negative for cough and shortness of breath.   Cardiovascular: Negative for chest pain and leg swelling.  Gastrointestinal: Negative for abdominal pain, constipation, diarrhea and heartburn.  Genitourinary: Negative for dysuria and frequency.  Musculoskeletal: Positive for joint pain. Negative for falls and myalgias.  Neurological: Negative for dizziness, seizures and headaches.  Psychiatric/Behavioral: Negative for depression. The patient is not nervous/anxious and does not have insomnia.     BP (!) 148/84   Pulse 72   Temp (!) 96.9 F (36.1 C) (Temporal)   Resp 18   Ht 5\' 9"  (1.753 m)   Wt 246 lb 1.3 oz (111.6 kg)   SpO2 97%   BMI 36.34 kg/m   Physical Exam  Constitutional: She is oriented to person, place, and time. She appears well-developed and well-nourished.  Obese.  HENT:  Head: Normocephalic and atraumatic.  Mouth/Throat: Oropharynx is clear and moist.  Eyes: Conjunctivae are normal. Pupils are equal, round, and reactive to light.  Neck: Normal range of motion. Neck supple.  Cardiovascular: Normal rate, regular rhythm and normal heart sounds.   Pulmonary/Chest: Effort normal and breath sounds normal. No respiratory distress.  Abdominal: Soft. Bowel sounds are normal.  Musculoskeletal: Normal range of motion. She exhibits no edema.  Both knees are wrapped in elastic braces. Crepitus is noted.  Lymphadenopathy:    She has no cervical adenopathy.  Neurological: She is alert and oriented to person, place, and time.  Gait antalgic. Gets slowly out of chair.  Skin: Skin is warm and dry.  Psychiatric: She has a normal mood and affect. Her behavior is normal. Thought content normal.  Nursing note and vitals reviewed. ASSESSMENT/PLAN:   1. Essential hypertension Not well controlled. Patient will bring in her blood pressure cuff and we will follow - CBC - COMPLETE METABOLIC PANEL WITH GFR - Hemoglobin A1c - Lipid panel - VITAMIN D  25 Hydroxy (Vit-D Deficiency, Fractures) - Microalbumin / creatinine urine ratio - Urinalysis, Routine w reflex microscopic  2. Morbid obesity ([place]) Patient is working on a diet and exercise program. We will follow   3. Elevated blood sugar level Question diabetes. Patient is on metformin. We'll check hemoglobin A1c.  Greater than 50% of this visit was spent in counseling and coordinating care.  Total face to face time:   30 minutes. Most of this is spent in lifestyle discussion, diet, exercise, weight reduction, sugar management   Patient Instructions  Need records from Dr Wenda Overland Need records from [place] work on the diet and exercise Bring your BP cuff next visit  I have ordered screening lab testing I  will send you a letter with your test results.  If there is anything of concern, we will call right away.  See me in a month   Raylene Everts, MD

## 2017-03-05 ENCOUNTER — Encounter: Payer: Self-pay | Admitting: Family Medicine

## 2017-03-05 LAB — URINALYSIS, ROUTINE W REFLEX MICROSCOPIC
BILIRUBIN URINE: NEGATIVE
GLUCOSE, UA: NEGATIVE
KETONES UR: NEGATIVE
Leukocytes, UA: NEGATIVE
NITRITE: NEGATIVE
PH: 6 (ref 5.0–8.0)
Protein, ur: NEGATIVE
SPECIFIC GRAVITY, URINE: 1.006 (ref 1.001–1.035)

## 2017-03-05 LAB — MICROALBUMIN / CREATININE URINE RATIO
CREATININE, URINE: 30 mg/dL (ref 20–320)
MICROALB UR: 3.3 mg/dL
Microalb Creat Ratio: 110 mcg/mg creat — ABNORMAL HIGH (ref <30)

## 2017-03-05 LAB — URINALYSIS, MICROSCOPIC ONLY
BACTERIA UA: NONE SEEN [HPF]
CRYSTALS: NONE SEEN [HPF]
Casts: NONE SEEN [LPF]
RBC / HPF: NONE SEEN RBC/HPF (ref <=2)
YEAST: NONE SEEN [HPF]

## 2017-03-05 LAB — HEMOGLOBIN A1C
Hgb A1c MFr Bld: 5.6 % (ref <5.7)
Mean Plasma Glucose: 114 mg/dL

## 2017-03-05 LAB — VITAMIN D 25 HYDROXY (VIT D DEFICIENCY, FRACTURES): VIT D 25 HYDROXY: 28 ng/mL — AB (ref 30–100)

## 2017-03-09 ENCOUNTER — Other Ambulatory Visit: Payer: Self-pay | Admitting: Family Medicine

## 2017-03-09 MED ORDER — ATENOLOL-CHLORTHALIDONE 50-25 MG PO TABS: 1.0000 | ORAL_TABLET | Freq: Every day | ORAL | 3 refills | Status: DC

## 2017-03-11 ENCOUNTER — Other Ambulatory Visit: Payer: Self-pay

## 2017-03-15 ENCOUNTER — Telehealth: Payer: Self-pay | Admitting: Family Medicine

## 2017-03-15 NOTE — Telephone Encounter (Signed)
Returned patient call. She wants to know if records have been received from Dr Wenda Overland yet so she can continue metformin.  Also, she asks about her lab work: -HgbA1c lists patient as 'non-diabetic' -Total Chol/ HDL ratio comments 'increased risk cardiovascular disease'  She wants to know how this pertains to her.   Informed her Dr Meda Coffee would not be back until tomorrow, she verbalized understanding.

## 2017-03-15 NOTE — Telephone Encounter (Signed)
°  Patient would like to discuss the results of the blood work that was sent to her. Please call today.    also  Patient is requesting her metformin and atenolol to be sent in  @ [email or web address]   Fax [phone]

## 2017-03-16 NOTE — Telephone Encounter (Signed)
I do not have record Dr Wenda Overland We can discuss her labs when she comes in on the 31st

## 2017-03-17 NOTE — Telephone Encounter (Signed)
Called Allan, aware.

## 2017-03-25 ENCOUNTER — Telehealth: Payer: Self-pay

## 2017-03-25 MED ORDER — ATENOLOL-CHLORTHALIDONE 50-25 MG PO TABS: 1.0000 | ORAL_TABLET | Freq: Every day | ORAL | 1 refills | Status: DC

## 2017-03-25 NOTE — Telephone Encounter (Signed)
New rx sent for atenolol to [facility] per pt.

## 2017-03-25 NOTE — Telephone Encounter (Signed)
Pt wants to know if you have the rx request form her mail order pharmacy that should have been faxed to you.  She said she is going to another state for 4 mo and needs this asap.

## 2017-03-26 ENCOUNTER — Encounter: Payer: Self-pay | Admitting: Family Medicine

## 2017-03-26 DIAGNOSIS — R7303 Prediabetes: Secondary | ICD-10-CM | POA: Insufficient documentation

## 2017-03-30 ENCOUNTER — Encounter: Payer: Self-pay | Admitting: Family Medicine

## 2017-03-30 ENCOUNTER — Ambulatory Visit (INDEPENDENT_AMBULATORY_CARE_PROVIDER_SITE_OTHER): Payer: Medicare Other | Admitting: Family Medicine

## 2017-03-30 VITALS — BP 138/80 | HR 71 | Temp 98.3°F | Resp 16 | Ht 69.0 in | Wt 246.2 lb

## 2017-03-30 DIAGNOSIS — I10 Essential (primary) hypertension: Secondary | ICD-10-CM | POA: Diagnosis not present

## 2017-03-30 DIAGNOSIS — E785 Hyperlipidemia, unspecified: Secondary | ICD-10-CM | POA: Diagnosis not present

## 2017-03-30 DIAGNOSIS — R739 Hyperglycemia, unspecified: Secondary | ICD-10-CM | POA: Diagnosis not present

## 2017-03-30 HISTORY — DX: Hyperlipidemia, unspecified: E78.5

## 2017-03-30 MED ORDER — DICLOFENAC SODIUM 1 % TD GEL
2.0000 g | Freq: Four times a day (QID) | TRANSDERMAL | 11 refills | Status: DC
Start: 2017-03-30 — End: 2017-09-28

## 2017-03-30 NOTE — Progress Notes (Signed)
Chief Complaint  Patient presents with  . Follow-up   Here for follow up Discussed normal blood sugar and A1c She wants to go off of metformin, only took it for one month.  She can do this but is advised to be strict about her diet and exercise.  She will get a A1c and weight next visit Discussed lipids.  HDL is 33.  Her cardiovascular risk over 76 y calculates to 11%.  Discussed use of statin for risk over 10%  She declines and wants to try diet and exercise.  Will repeat lipids in 4 months also Is going to [place] for 4 months to visit new grand daughter Advised her to get a flu shot there   Patient Active Problem List   Diagnosis Date Noted  . Pre-diabetes 03/26/2017  . Essential hypertension 03/04/2017  . Morbid obesity ([place]) 03/04/2017  . Elevated blood sugar level 03/04/2017  . OSTEOARTHRITIS, KNEES, BILATERAL, SEVERE 05/07/2010    Outpatient Encounter Prescriptions as of 03/30/2017  Medication Sig  . atenolol-chlorthalidone (TENORETIC) 50-25 MG tablet Take 1 tablet by mouth daily.  . cholecalciferol (VITAMIN D) 1000 units tablet Take 2,000 Units by mouth daily.  . Cinnamon 500 MG capsule Take 1,000 mg by mouth daily.  . diclofenac sodium (VOLTAREN) 1 % GEL Apply topically 4 (four) times daily.  . Ginkgo 60 MG TABS Take by mouth.  . metFORMIN (GLUCOPHAGE-XR) 500 MG 24 hr tablet Take 500 mg by mouth daily.  . Misc Natural Products (TART CHERRY ADVANCED PO) Take by mouth.  . Potassium 99 MG TABS Take by mouth.  . Turmeric 500 MG CAPS Take by mouth.  . vitamin A 10000 UNIT capsule Take 2,400 Units by mouth daily.  . vitamin C (ASCORBIC ACID) 500 MG tablet Take 500 mg by mouth daily.  . vitamin E 400 UNIT capsule Take 400 Units by mouth daily.  . diclofenac sodium (VOLTAREN) 1 % GEL Apply 2 g topically 4 (four) times daily.   No facility-administered encounter medications on file as of 03/30/2017.     No Known Allergies  Review of Systems  Constitutional: Negative for  activity change, appetite change and unexpected weight change.  HENT: Negative for congestion, dental problem, postnasal drip and rhinorrhea.   Eyes: Negative for redness and visual disturbance.  Respiratory: Negative for cough and shortness of breath.   Cardiovascular: Negative for chest pain, palpitations and leg swelling.  Gastrointestinal: Negative for abdominal pain, constipation and diarrhea.  Genitourinary: Negative for difficulty urinating, frequency and vaginal bleeding.  Musculoskeletal: Positive for arthralgias. Negative for back pain.  Neurological: Negative for dizziness and headaches.  Psychiatric/Behavioral: Negative for dysphoric mood and sleep disturbance. The patient is not nervous/anxious.     BP 138/80   Pulse 71   Temp 98.3 F (36.8 C) (Other (Comment))   Resp 16   Ht 5\' 9"  (1.753 m)   Wt 246 lb 4 oz (111.7 kg)   SpO2 98%   BMI 36.36 kg/m   Physical Exam  Constitutional: She is oriented to person, place, and time. She appears well-developed and well-nourished.  obese  HENT:  Head: Normocephalic and atraumatic.  Right Ear: External ear normal.  Left Ear: External ear normal.  Mouth/Throat: Oropharynx is clear and moist.  Eyes: Pupils are equal, round, and reactive to light. Conjunctivae are normal.  Neck: Normal range of motion. Neck supple. No thyromegaly present.  Cardiovascular: Normal rate, regular rhythm and normal heart sounds.   Pulmonary/Chest: Effort normal and  breath sounds normal. No respiratory distress.  Abdominal: Soft. Bowel sounds are normal.  Musculoskeletal: Normal range of motion. She exhibits no edema.  Lymphadenopathy:    She has no cervical adenopathy.  Neurological: She is alert and oriented to person, place, and time.  Gait antalgic  Skin: Skin is warm and dry.  Psychiatric: She has a normal mood and affect. Her behavior is normal. Thought content normal.  Nursing note and vitals reviewed.   ASSESSMENT/PLAN:  1. Essential  hypertension controlled  2. Elevated blood sugar level History diabetes. Aic is 5.6  3. Morbid obesity Diet and exercise reviewed  4. Hyperlipidemia Discussed HDL.  Cholesterol diet sheet given with instructions   Patient Instructions  Continue to exercise daily Eat well and watch the sweets, carbohydrates and cholesterol  Need to see me and have repeat labs when you return.  Call sooner for problems    Raylene Everts, MD

## 2017-03-30 NOTE — Patient Instructions (Signed)
Continue to exercise daily Eat well and watch the sweets, carbohydrates and cholesterol  Need to see me and have repeat labs when you return.  Call sooner for problems

## 2017-04-08 ENCOUNTER — Other Ambulatory Visit: Payer: Self-pay

## 2017-06-08 DIAGNOSIS — M25561 Pain in right knee: Secondary | ICD-10-CM | POA: Diagnosis not present

## 2017-06-08 DIAGNOSIS — M25562 Pain in left knee: Secondary | ICD-10-CM | POA: Diagnosis not present

## 2017-06-08 DIAGNOSIS — M79672 Pain in left foot: Secondary | ICD-10-CM | POA: Diagnosis not present

## 2017-06-08 DIAGNOSIS — M79671 Pain in right foot: Secondary | ICD-10-CM | POA: Diagnosis not present

## 2017-08-04 ENCOUNTER — Encounter: Payer: Self-pay | Admitting: Family Medicine

## 2017-08-12 ENCOUNTER — Telehealth: Payer: Self-pay | Admitting: Family Medicine

## 2017-08-12 NOTE — Telephone Encounter (Signed)
Patient called in to request an appt for vaginal discharge and bleeding. She did not have an gyn dr. Dr.Nelson permission I gave her that einformation.

## 2017-08-30 ENCOUNTER — Encounter (INDEPENDENT_AMBULATORY_CARE_PROVIDER_SITE_OTHER): Payer: Self-pay

## 2017-08-30 ENCOUNTER — Ambulatory Visit: Payer: Medicare Other | Admitting: Adult Health

## 2017-08-30 ENCOUNTER — Encounter: Payer: Self-pay | Admitting: Adult Health

## 2017-08-30 VITALS — BP 148/70 | HR 88 | Resp 16 | Ht 69.0 in | Wt 242.0 lb

## 2017-08-30 DIAGNOSIS — N950 Postmenopausal bleeding: Secondary | ICD-10-CM

## 2017-08-30 DIAGNOSIS — R1032 Left lower quadrant pain: Secondary | ICD-10-CM

## 2017-08-30 NOTE — Progress Notes (Signed)
Subjective:     Patient ID: Yesenia Montgomery, female   DOB: 1955/09/20, 61 y.o.   MRN: [id number]  HPI Yesenia Montgomery is a 61 year old black female, widowed, PM , in complaining of LLQ pain, vaginal discharge with spotting, red.Has lost 30 + lbs in last year, is watching what she eats. She was living in [place], and was told at one time had cyst on ovary.  PCP is Dr Meda Coffee.   Review of Systems +LLQ pain +vaginal discharge, with+spotting +weight loss  No sex in over a year  Reviewed past medical,surgical, social and family history. Reviewed medications and allergies.     Objective:   Physical Exam BP (!) 148/70 (BP Location: Left Arm, Patient Position: Sitting, Cuff Size: Normal)   Pulse 88   Resp 16   Ht 5\' 9"  (1.753 m)   Wt 242 lb (109.8 kg)   BMI 35.74 kg/m PHQ 2 score 0.Skin warm and dry.Pelvic: external genitalia is normal in appearance no lesions, vagina: scant tan discharge without odor,urethra has no lesions or masses noted, cervix:smooth, uterus:ULNS, non tender, no masses felt, adnexa: no masses or tenderness noted. Bladder is non tender and no masses felt.    Will get GYN [place] to assess uterus and ovaries.   Assessment:     1. PMB (postmenopausal bleeding)   2. LLQ pain       Plan:     Return in 1 week for GYN [place] Review handout on PMB

## 2017-08-30 NOTE — Patient Instructions (Signed)
Postmenopausal Bleeding Postmenopausal bleeding is any bleeding after menopause. Menopause is when a woman's period stops. Any type of bleeding after menopause is concerning. It should be checked by your doctor. Any treatment will depend on the cause. Follow these instructions at home: Watch your condition for any changes.  Avoid the use of tampons and douches as told by your doctor.  Change your pads often.  Get regular pelvic exams and Pap tests.  Keep all appointments for tests as told by your doctor.  Contact a doctor if:  Your bleeding lasts for more than 1 week.  You have belly (abdominal) pain.  You have bleeding after sex (intercourse). Get help right away if:  You have a fever, chills, a headache, dizziness, muscle aches, and bleeding.  You have strong pain with bleeding.  You have clumps of blood (blood clots) coming from your vagina.  You have bleeding and need more than 1 pad an hour.  You feel like you are going to pass out (faint). This information is not intended to replace advice given to you by your health care provider. Make sure you discuss any questions you have with your health care provider. Document Released: 05/26/2008 Document Revised: 01/23/2016 Document Reviewed: 03/16/2013 Elsevier Interactive Patient Education  2017 Elsevier Inc.  

## 2017-09-02 ENCOUNTER — Other Ambulatory Visit: Payer: Self-pay | Admitting: Adult Health

## 2017-09-02 DIAGNOSIS — R1032 Left lower quadrant pain: Secondary | ICD-10-CM

## 2017-09-06 ENCOUNTER — Ambulatory Visit (INDEPENDENT_AMBULATORY_CARE_PROVIDER_SITE_OTHER): Payer: Medicare Other

## 2017-09-06 DIAGNOSIS — R1032 Left lower quadrant pain: Secondary | ICD-10-CM | POA: Diagnosis not present

## 2017-09-06 DIAGNOSIS — D259 Leiomyoma of uterus, unspecified: Secondary | ICD-10-CM

## 2017-09-06 NOTE — Progress Notes (Signed)
PELVIC US TA/TV: enlarged heterogeneous uterus w/mult.fibroids (#1) anterior right subserosal fibroid 4.7 x 3.1 x 3.8 cm,(#2) posterior left subserosal fibroid 4.3 x 4.3 x 3.9 cm,(#3) LUS subserosal fibroid 3.4 X 3.1 X 3.6 cm,homogeneous thickened endometrium 26 mm,normal ovaries bilat,simple left adnexal cyst separate from left ovary 7 x 3 x 4.6 cm,no free fluid,no pain during ultrasound,ovaries appear mobile

## 2017-09-07 ENCOUNTER — Telehealth: Payer: Self-pay | Admitting: Adult Health

## 2017-09-07 DIAGNOSIS — D219 Benign neoplasm of connective and other soft tissue, unspecified: Secondary | ICD-10-CM | POA: Insufficient documentation

## 2017-09-07 DIAGNOSIS — N950 Postmenopausal bleeding: Secondary | ICD-10-CM | POA: Insufficient documentation

## 2017-09-07 NOTE — Telephone Encounter (Signed)
Pt aware US shows multiple fibroids and thickened endometrium 26 mm will get endometrial biopsy with Dr Elonda Husky

## 2017-09-20 ENCOUNTER — Other Ambulatory Visit: Payer: Self-pay | Admitting: Obstetrics & Gynecology

## 2017-09-20 ENCOUNTER — Encounter: Payer: Self-pay | Admitting: Obstetrics & Gynecology

## 2017-09-20 ENCOUNTER — Other Ambulatory Visit: Payer: Self-pay

## 2017-09-20 ENCOUNTER — Ambulatory Visit (INDEPENDENT_AMBULATORY_CARE_PROVIDER_SITE_OTHER): Payer: Medicare Other | Admitting: Obstetrics & Gynecology

## 2017-09-20 VITALS — BP 140/86 | HR 71 | Ht 69.0 in | Wt 240.0 lb

## 2017-09-20 DIAGNOSIS — R9389 Abnormal findings on diagnostic imaging of other specified body structures: Secondary | ICD-10-CM | POA: Diagnosis not present

## 2017-09-20 DIAGNOSIS — N840 Polyp of corpus uteri: Secondary | ICD-10-CM

## 2017-09-20 DIAGNOSIS — N950 Postmenopausal bleeding: Secondary | ICD-10-CM

## 2017-09-20 DIAGNOSIS — N8502 Endometrial intraepithelial neoplasia [EIN]: Secondary | ICD-10-CM | POA: Diagnosis not present

## 2017-09-20 NOTE — Progress Notes (Signed)
Endometrial Biopsy Procedure Note  Pre-operative Diagnosis: post menopausal bleeding, thickened endometrium  Post-operative Diagnosis: Same + prolapsed endometrial polyp  Indications: postmenopausal bleeding, thickened endometrium  Procedure Details   Urine pregnancy test was not done.  The risks (including infection, bleeding, pain, and uterine perforation) and benefits of the procedure were explained to the patient and Written informed consent was obtained.  Antibiotic prophylaxis against endocarditis was not indicated.   The patient was placed in the dorsal lithotomy position.  Bimanual exam showed the uterus to be in the anteroflexed position.  A Graves' speculum inserted in the vagina, and the cervix prepped with povidone iodine.  Endocervical curettage with a Kevorkian curette was not performed.   Aprolapsed mass was protruding from the cervical os and it was removed with a ring forcep by twisting.  A sharp tenaculum was applied to the anterior lip of the cervix for stabilization.  A sterile uterine sound was used to sound the uterus to a depth of >10 cm.  A Pipelle endometrial aspirator was used to sample the endometrium.  Sample was sent for pathologic examination.  Condition: Stable  Complications: None  Plan:  The patient was advised to call for any fever or for prolonged or severe pain or bleeding. She was advised to use OTC ibuprofen as needed for mild to moderate pain. She was advised to avoid vaginal intercourse for 48 hours or until the bleeding has completely stopped.  Attending Physician Documentation: Will  Follow up next week to go over pathology results and make plans from there

## 2017-09-20 NOTE — Addendum Note (Signed)
Addended by: Gaylyn Rong A on: 09/20/2017 02:06 PM   Modules accepted: Orders

## 2017-09-28 ENCOUNTER — Ambulatory Visit (INDEPENDENT_AMBULATORY_CARE_PROVIDER_SITE_OTHER): Payer: Medicare Other | Admitting: Obstetrics & Gynecology

## 2017-09-28 ENCOUNTER — Encounter: Payer: Self-pay | Admitting: Obstetrics & Gynecology

## 2017-09-28 VITALS — BP 154/88 | HR 75 | Ht 69.0 in | Wt 240.5 lb

## 2017-09-28 DIAGNOSIS — N8502 Endometrial intraepithelial neoplasia [EIN]: Secondary | ICD-10-CM

## 2017-09-28 DIAGNOSIS — D219 Benign neoplasm of connective and other soft tissue, unspecified: Secondary | ICD-10-CM

## 2017-09-28 NOTE — Progress Notes (Signed)
Follow up appointment for results  Chief Complaint  Patient presents with  . discuss biopsy results    Blood pressure (!) 154/88, pulse 75, height 5\' 9"  (1.753 m), weight 240 lb 8 oz (109.1 kg).    Pathology: Complex endometrial Hyperplasia with atypia  MEDS ordered this encounter: No orders of the defined types were placed in this encounter.   Orders for this encounter: No orders of the defined types were placed in this encounter.   Impression: Complex atypical endometrial hyperplasia  Fibroids    Plan: Will need total abdominal hysterectomy with BSO Pt aware final pathology could indeed include endometrial cancer although if is will generally be Grade I with confinement to the endometrium  Will plan for TAH BSO 10/27/2017  Follow Up: Return in about 5 weeks (around 11/04/2017) for [place], with Dr Elonda Husky.       Face to face time:  10 minutes  Greater than 50% of the visit time was spent in counseling and coordination of care with the patient.  The summary and outline of the counseling and care coordination is summarized in the note above.   All questions were answered.  Past Medical History:  Diagnosis Date  . Arthritis    knees  . HLD (hyperlipidemia) 03/30/2017  . Hypertension     Past Surgical History:  Procedure Laterality Date  . TONSILLECTOMY      OB History    Gravida Para Term Preterm AB Living   3 2 2   1 2    SAB TAB Ectopic Multiple Live Births                  Allergies  Allergen Reactions  . Latex Rash    Social History   Socioeconomic History  . Marital status: Widowed    Spouse name: None  . Number of children: 2  . Years of education: 79  . Highest education level: None  Social Needs  . Financial resource strain: None  . Food insecurity - worry: None  . Food insecurity - inability: None  . Transportation needs - medical: None  . Transportation needs - non-medical: None  Occupational History  . Occupation:  retired/disabled    Comment: board of [employer]  Tobacco Use  . Smoking status: Former Research scientist (life sciences)  . Smokeless tobacco: Never Used  Substance and Sexual Activity  . Alcohol use: No  . Drug use: No  . Sexual activity: Not Currently    Birth control/protection: Post-menopausal    Comment: widow  Other Topics Concern  . None  Social History Narrative   Lives alone   Children in [place]   Has lived in [place] since 2009 - husband was a Set designer to [employer], swims   reads at [employer]    Family History  Problem Relation Age of Onset  . Stroke Mother   . Heart disease Mother   . Hypertension Mother   . Hyperlipidemia Mother   . Early death Father        bedridden by age of 11 ?  Marland Kitchen Alcohol abuse Father   . Cancer Paternal Aunt   . Diabetes Cousin

## 2017-10-13 ENCOUNTER — Other Ambulatory Visit: Payer: Self-pay | Admitting: Family Medicine

## 2017-10-13 NOTE — Telephone Encounter (Signed)
Seen [phone]

## 2017-10-20 NOTE — Patient Instructions (Signed)
Yesenia Montgomery  10/20/2017     @[email or web address] @   Your procedure is scheduled on  10/27/2017   Report to [facility] at  645   A.M.  Call this number if you have problems the morning of surgery:  [phone]   Remember:  Do not eat food or drink liquids after midnight.  Take these medicines the morning of surgery with A SIP OF WATER  tenoretic.   Do not wear jewelry, make-up or nail polish.  Do not wear lotions, powders, or perfumes, or deodorant.  Do not shave 48 hours prior to surgery.  Men may shave face and neck.  Do not bring valuables to the hospital.  [facility] is not responsible for any belongings or valuables.  Contacts, dentures or bridgework may not be worn into surgery.  Leave your suitcase in the car.  After surgery it may be brought to your room.  For patients admitted to the hospital, discharge time will be determined by your treatment team.  Patients discharged the day of surgery will not be allowed to drive home.   Name and phone number of your driver:   family Special instructions:  None  Please read over the following fact sheets that you were given. Pain Booklet, Coughing and Deep Breathing, Blood Transfusion Information, MRSA Information, Surgical Site Infection Prevention, Anesthesia Post-op Instructions and Care and Recovery After Surgery       Abdominal Hysterectomy Abdominal hysterectomy is a surgery to remove your womb (uterus). The womb is the part of your body that holds a growing baby. You may need this procedure if:  You have cancer.  You have growths (tumors or fibroids) in your uterus.  You have long-term (chronic) pain.  You are bleeding.  Your womb has slipped down into your vagina.  You have a condition in which the tissue that lines the womb grows outside of its normal place.  You have an infection in your womb.  You have problems with your period.  You may also need other reproductive parts  removed. This will depend on why you need to have the surgery. What happens before the procedure? Staying hydrated Follow instructions from your doctor about hydration. This may include:  Up to 2 hours before the procedure - you may continue to drink clear liquids, such as: ? Water. ? Fruit juice. ? Black coffee. ? Plain tea.  Eating and drinking restrictions Follow instructions from your doctor about eating and drinking. These may include:  8 hours before the procedure - stop eating heavy meals or foods, such as: ? Meat. ? Fried foods. ? Fatty foods.  6 hours before the procedure - stop eating light meals or foods, such as: ? Toast. ? Cereal.  6 hours before the procedure - stop drinking: ? Milk ? Drinks that have milk in them.  2 hours before the procedure - stop drinking clear liquids.  Medicines  Ask your doctor about: ? Changing or stopping your normal medicines. This is important if you take diabetes medicines or blood thinners. ? Taking medicines such as aspirin and ibuprofen. These medicines can thin your blood. Do not take these medicines before your procedure if your doctor tells you not to.  You may be given antibiotic medicine. This can help prevent infection.  You may be asked to take medicines that help you poop (laxatives). General instructions  Ask your doctor how your  surgical site will be marked or identified.  You may be asked to shower with a germ-killing soap.  Plan to have someone take you home from the hospital.  Do not use any products that contain nicotine or tobacco, such as cigarettes and e-cigarettes. If you need help quitting, ask your doctor.  You may have an exam or tests done.  You may have a blood or urine sample taken.  You may need to have an enema to clean out your rectum and lower colon.  Talk to your doctor about the changes this procedure may cause. These can be physical or emotional. What happens during the  procedure?  To lower your risk of infection: ? Your health care team will wash or clean their hands. ? Your skin will be washed with soap. ? Hair may be removed from the surgical area.  An IV tube will be put into one of your veins.  You will be given one or more of the following: ? A medicine to help you relax (sedative). ? A medicine to make you fall asleep (general anesthetic).  Tight-fitting (compression) stockings will be placed on your legs to help with circulation.  A thin, flexible tube (catheter) will be inserted to help drain your urine.  The doctor will make a cut (incision) through the skin in your lower belly. It may go side-to-side or up-and-down.  The doctor will move the body tissues that cover your womb.  The doctor will remove your womb.  The doctor may take out any other parts that need to be removed.  The doctor will control the bleeding.  The doctor will close your cut with stitches (sutures), skin glue, or adhesive strips.  A bandage (dressing) will be placed over the cut. The procedure may vary among doctors and hospitals. What happens after the procedure?  You will be given pain medicine if you need it.  Your blood pressure, heart rate, breathing rate, and blood oxygen level will be watched until the medicines you were given have worn off.  You will need to stay in the hospital to recover. Ask your doctor how long you will need to stay in the hospital after your procedure.  You may have a liquid diet at first. You will most likely return to your usual diet the day after surgery.  You will still have the urinary catheter in place. It will likely be removed the day after surgery.  You may have to wear compression stockings. These stockings help to prevent blood clots and reduce swelling in your legs.  You will be encouraged to walk as soon as possible. You will also use a device or do breathing exercises to keep your lungs clear.  You may need to  use a sanitary pad for vaginal discharge. Summary  Abdominal hysterectomy is a surgery to remove your womb (uterus). The womb is the part of your body that holds a growing baby.  Talk to your doctor about the changes this procedure may cause. These can be physical or emotional.  You will be given pain medicine if you need it.  You will need to stay in the hospital to recover for one to two days. Ask your doctor how long you will need to stay in the hospital after your procedure. This information is not intended to replace advice given to you by your health care provider. Make sure you discuss any questions you have with your health care provider. Document Released: 08/22/2013 Document Revised: 08/05/2016 Document  Reviewed: 08/05/2016 Elsevier Interactive Patient Education  2017 [place].  Abdominal Hysterectomy, Care After This sheet gives you information about how to care for yourself after your procedure. Your doctor may also give you more specific instructions. If you have problems or questions, contact your doctor. Follow these instructions at home: Bathing  Do not take baths, swim, or use a hot tub until your doctor says it is okay. Ask your doctor if you can take showers. You may only be allowed to take sponge baths for bathing.  Keep the bandage (dressing) dry until your doctor says it can be taken off. Surgical cut ( incision) care  Follow instructions from your doctor about how to take care of your cut from surgery. Make sure you: ? Wash your hands with soap and water before you change your bandage (dressing). If you cannot use soap and water, use hand sanitizer. ? Change your bandage as told by your doctor. ? Leave stitches (sutures), skin glue, or skin tape (adhesive) strips in place. They may need to stay in place for 2 weeks or longer. If tape strips get loose and curl up, you may trim the loose edges. Do not remove tape strips completely unless your doctor says it is  okay.  Check your surgical cut area every day for signs of infection. Check for: ? Redness, swelling, or pain. ? Fluid or blood. ? Warmth. ? Pus or a bad smell. Activity  Do gentle, daily exercise as told by your doctor. You may be told to take short walks every day and go farther each time.  Do not lift anything that is heavier than 10 lb (4.5 kg), or the limit that your doctor tells you, until he or she says that it is safe.  Do not drive or use heavy machinery while taking prescription pain medicine.  Do not drive for 24 hours if you were given a medicine to help you relax (sedative).  Follow your doctor's advice about exercise, driving, and general activities. Ask your doctor what activities are safe for you. Lifestyle  Do not douche, use tampons, or have sex for at least 6 weeks or as told by your doctor.  Do not drink alcohol until your doctor says it is okay.  Drink enough fluid to keep your pee (urine) clear or pale yellow.  Try to have someone at home with you for the first 1-2 weeks to help.  Do not use any products that contain nicotine or tobacco, such as cigarettes and e-cigarettes. These can slow down healing. If you need help quitting, ask your doctor. General instructions  Take over-the-counter and prescription medicines only as told by your doctor.  Do not take aspirin or ibuprofen. These medicines can cause bleeding.  To prevent or treat constipation while you are taking prescription pain medicine, your doctor may suggest that you: ? Drink enough fluid to keep your urine clear or pale yellow. ? Take over-the-counter or prescription medicines. ? Eat foods that are high in fiber, such as:  Fresh fruits and vegetables.  Whole grains.  Beans. ? Limit foods that are high in fat and processed sugars, such as fried and sweet foods.  Keep all follow-up visits as told by your doctor. This is important. Contact a doctor if:  You have chills or fever.  You  have redness, swelling, or pain around your cut.  You have fluid or blood coming from your cut.  Your cut feels warm to the touch.  You have pus  or a bad smell coming from your cut.  Your cut breaks open.  You feel dizzy or light-headed.  You have pain or bleeding when you pee.  You keep having watery poop (diarrhea).  You keep feeling sick to your stomach (nauseous) or keep throwing up (vomiting).  You have unusual fluid (discharge) coming from your vagina.  You have a rash.  You have a reaction to your medicine.  Your pain medicine does not help. Get help right away if:  You have a fever and your symptoms get worse all of a sudden.  You have very bad belly (abdominal) pain.  You are short of breath.  You pass out (faint).  You have pain, swelling, or redness of your leg.  You bleed a lot from your vagina and notice clumps of blood (clots). Summary  Do not take baths, swim, or use a hot tub until your doctor says it is okay. Ask your doctor if you can take showers. You may only be allowed to take sponge baths for bathing.  Follow your doctor's advice about exercise, driving, and general activities. Ask your doctor what activities are safe for you.  Do not lift anything that is heavier than 10 lb (4.5 kg), or the limit that your doctor tells you, until he or she says that it is safe.  Try to have someone at home with you for the first 1-2 weeks to help. This information is not intended to replace advice given to you by your health care provider. Make sure you discuss any questions you have with your health care provider. Document Released: 05/26/2008 Document Revised: 08/05/2016 Document Reviewed: 08/05/2016 Elsevier Interactive Patient Education  2017 [employer]  Bilateral Salpingo-Oophorectomy Bilateral salpingo-oophorectomy is the surgical removal of both fallopian tubes and both ovaries. The ovaries are reproductive organs that produce eggs in women. The  fallopian tubes allow eggs to move from the ovaries to the uterus. You may need this procedure if you:  Have had your uterus removed. This procedure is usually done after the uterus is removed.  Have cancer of the fallopian tubes or ovaries.  Have a high risk of cancer of the fallopian tubes or ovaries.  There are three different techniques that can be used for this procedure:  Open. One large incision will be made in your abdomen.  Laparoscopic. A thin, lighted tube with a small camera on the end (laparoscope) will be used to help perform the procedure. The laparoscope will allow your surgeon to make several small incisions in the abdomen instead of one large incision.  Robot-assisted. A computer will be used to control surgical instruments that are attached to robotic arms. A laparoscope may also be used with this technique.  As a result of this procedure, you will become sterile (unable to become pregnant), and you will go into menopause (no longer able to have menstrual periods). You may develop symptoms of menopause such as hot flashes, night sweats, and mood changes. Your sex drive may also be affected. Tell a health care provider about:  Any allergies you have.  All medicines you are taking, including vitamins, herbs, eye drops, creams, and over-the-counter medicines.  Any problems you or family members have had with anesthetic medicines.  Any blood disorders you have.  Any surgeries you have had.  Any medical conditions you have.  Whether you are pregnant or may be pregnant. What are the risks? Generally, this is a safe procedure. However, problems may occur, including:  Infection.  Bleeding.  Allergic reactions to medicines.  Damage to other structures or organs.  Blood clots in the legs or lungs.  What happens before the procedure? Staying hydrated Follow instructions from your health care provider about hydration, which may include:  Up to 2 hours before  the procedure - you may continue to drink clear liquids, such as water, clear fruit juice, black coffee, and plain tea.  Eating and drinking restrictions Follow instructions from your health care provider about eating and drinking, which may include:  8 hours before the procedure - stop eating heavy meals or foods such as meat, fried foods, or fatty foods.  6 hours before the procedure - stop eating light meals or foods, such as toast or cereal.  6 hours before the procedure - stop drinking milk or drinks that contain milk.  2 hours before the procedure - stop drinking clear liquids.  Medicines  Ask your health care provider about: ? Changing or stopping your regular medicines. This is especially important if you are taking diabetes medicines or blood thinners. ? Taking medicines such as aspirin and ibuprofen. These medicines can thin your blood. Do not take these medicines before your procedure if your health care provider instructs you not to.  You may be given antibiotic medicine to help prevent infection. General instructions  Do not smoke for at least 2 weeks before your procedure or as told by your health care provider.  You may have an exam or testing.  You may have a blood or urine sample taken.  Ask your health care provider how your surgical site will be marked or identified.  Plan to have someone take you home from the hospital.  If you will be going home right after the procedure, plan to have someone with you for 24 hours. What happens during the procedure?  To reduce your risk of infection: ? Your health care team will wash or sanitize their hands. ? Your skin will be washed with soap. ? Hair may be removed from the surgical area.  An IV tube will be inserted into one of your veins.  You will be given one or more of the following: ? A medicine to help you relax (sedative). ? A medicine to make you fall asleep (general anesthetic).  A thin tube (catheter)  will be inserted through your urethra and into your bladder. The catheter drains urine during your procedure.  Depending on the type of surgery you are having, your surgeon will do one of the following: ? Make one incision in your abdomen (open surgery). ? Make two small incisions in your abdomen (laparoscopic surgery). The laparoscope will be passed through one incision, and surgical instruments will be passed through the other. ? Make several small incisions in your abdomen (robot-assisted surgery). A laparoscope and other surgical instruments may be passed through the incisions.  Your fallopian tubes and ovaries will be cut away from the uterus and removed.  Your blood vessels will be clamped and tied to prevent too much bleeding.  The incision(s) in your abdomen will be closed with stitches (sutures) or staples.  A bandage (dressing) may be placed over your incision(s). The procedure may vary among health care providers and hospitals. What happens after the procedure?  Your blood pressure, heart rate, breathing rate, and blood oxygen level will be monitored until the medicines you were given have worn off.  You may continue to receive fluids and medicines through an IV tube.  You may continue to  have a catheter draining your urine.  You may have to wear compression stockings. These stockings help to prevent blood clots and reduce swelling in your legs.  You will be given pain medicine as needed.  Do not drive for 24 hours if you received a sedative. Summary  Bilateral salpingo-oophorectomy is a procedure to remove both fallopian tubes and both ovaries.  There are three different techniques that can be used for this procedure, including open, laparoscopic, and robotic. Talk with your health care provider about how your procedure will be done.  As a result of this procedure, you will become sterile and you will go into menopause.  Plan to have someone take you home from the  hospital. This information is not intended to replace advice given to you by your health care provider. Make sure you discuss any questions you have with your health care provider. Document Released: 08/17/2005 Document Revised: 09/21/2016 Document Reviewed: 09/21/2016 Elsevier Interactive Patient Education  2018 [place].  Bilateral Salpingo-Oophorectomy, Care After This sheet gives you information about how to care for yourself after your procedure. Your health care provider may also give you more specific instructions. If you have problems or questions, contact your health care provider. What can I expect after the procedure? After the procedure, it is common to have:  Abdominal pain.  Some occasional vaginal bleeding (spotting).  Tiredness.  Symptoms of menopause, such as hot flashes, night sweats, or mood swings.  Follow these instructions at home: Incision care  Keep your incision area and your bandage (dressing) clean and dry.  Follow instructions from your health care provider about how to take care of your incision. Make sure you: ? Wash your hands with soap and water before you change your dressing. If soap and water are not available, use hand sanitizer. ? Change your dressing as told by your health care provider. ? Leave stitches (sutures), staples, skin glue, or adhesive strips in place. These skin closures may need to stay in place for 2 weeks or longer. If adhesive strip edges start to loosen and curl up, you may trim the loose edges. Do not remove adhesive strips completely unless your health care provider tells you to do that.  Check your incision area every day for signs of infection. Check for: ? Redness, swelling, or pain. ? Fluid or blood. ? Warmth. ? Pus or a bad smell. Activity  Do not drive or use heavy machinery while taking prescription pain medicine.  Do not drive for 24 hours if you received a medicine to help you relax (sedative) during your  procedure.  Take frequent, short walks throughout the day. Rest when you get tired. Ask your health care provider what activities are safe for you.  Avoid activity that requires great effort. Also, avoid heavy lifting. Do not lift anything that is heavier than 10 lbs. (4.5 kg), or the limit that your health care provider tells you, until he or she says that it is safe to do so.  Do not douche, use tampons, or have sex until your health care provider approves. General instructions  To prevent or treat constipation while you are taking prescription pain medicine, your health care provider may recommend that you: ? Drink enough fluid to keep your urine clear or pale yellow. ? Take over-the-counter or prescription medicines. ? Eat foods that are high in fiber, such as fresh fruits and vegetables, whole grains, and beans. ? Limit foods that are high in fat and processed sugars, such  as fried and sweet foods.  Take over-the-counter and prescription medicines only as told by your health care provider.  Do not take baths, swim, or use a hot tub until your health care provider approves. Ask your health care provider if you can take showers. You may only be allowed to take sponge baths for bathing.  Wear compression stockings as told by your health care provider. These stockings help to prevent blood clots and reduce swelling in your legs.  Keep all follow-up visits as told by your health care provider. This is important. Contact a health care provider if:  You have pain when you urinate.  You have pus or a bad smelling discharge coming from your vagina.  You have redness, swelling, or pain around your incision.  You have fluid or blood coming from your incision.  Your incision feels warm to the touch.  You have pus or a bad smell coming from your incision.  You have a fever.  Your incision starts to break open.  You have pain in the abdomen, and it gets worse or does not get better  when you take medicine.  You develop a rash.  You develop nausea and vomiting.  You feel lightheaded. Get help right away if:  You develop pain in your chest or leg.  You become short of breath.  You faint.  You have increased bleeding from your vagina. Summary  After the procedure, it is common to have pain, bleeding in the vagina, and symptoms of menopause.  Follow instructions from your health care provider about how to take care of your incision.  Follow instructions from your health care provider about activities and restrictions.  Check your incision every day for signs of infection and report any symptoms to your health care provider. This information is not intended to replace advice given to you by your health care provider. Make sure you discuss any questions you have with your health care provider. Document Released: 08/17/2005 Document Revised: 09/21/2016 Document Reviewed: 09/21/2016 Elsevier Interactive Patient Education  2018 [place] Anesthesia, Adult General anesthesia is the use of medicines to make a person "go to sleep" (be unconscious) for a medical procedure. General anesthesia is often recommended when a procedure:  Is long.  Requires you to be still or in an unusual position.  Is major and can cause you to lose blood.  Is impossible to do without general anesthesia.  The medicines used for general anesthesia are called general anesthetics. In addition to making you sleep, the medicines:  Prevent pain.  Control your blood pressure.  Relax your muscles.  Tell a health care provider about:  Any allergies you have.  All medicines you are taking, including vitamins, herbs, eye drops, creams, and over-the-counter medicines.  Any problems you or family members have had with anesthetic medicines.  Types of anesthetics you have had in the past.  Any bleeding disorders you have.  Any surgeries you have had.  Any medical  conditions you have.  Any history of heart or lung conditions, such as heart failure, sleep apnea, or chronic obstructive pulmonary disease (COPD).  Whether you are pregnant or may be pregnant.  Whether you use tobacco, alcohol, marijuana, or street drugs.  Any history of Armed forces logistics/support/administrative officer.  Any history of depression or anxiety. What are the risks? Generally, this is a safe procedure. However, problems may occur, including:  Allergic reaction to anesthetics.  Lung and heart problems.  Inhaling food or liquids from your  stomach into your lungs (aspiration).  Injury to nerves.  Waking up during your procedure and being unable to move (rare).  Extreme agitation or a state of mental confusion (delirium) when you wake up from the anesthetic.  Air in the bloodstream, which can lead to stroke.  These problems are more likely to develop if you are having a major surgery or if you have an advanced medical condition. You can prevent some of these complications by answering all of your health care provider's questions thoroughly and by following all pre-procedure instructions. General anesthesia can cause side effects, including:  Nausea or vomiting  A sore throat from the breathing tube.  Feeling cold or shivery.  Feeling tired, washed out, or achy.  Sleepiness or drowsiness.  Confusion or agitation.  What happens before the procedure? Staying hydrated Follow instructions from your health care provider about hydration, which may include:  Up to 2 hours before the procedure - you may continue to drink clear liquids, such as water, clear fruit juice, black coffee, and plain tea.  Eating and drinking restrictions Follow instructions from your health care provider about eating and drinking, which may include:  8 hours before the procedure - stop eating heavy meals or foods such as meat, fried foods, or fatty foods.  6 hours before the procedure - stop eating light meals or  foods, such as toast or cereal.  6 hours before the procedure - stop drinking milk or drinks that contain milk.  2 hours before the procedure - stop drinking clear liquids.  Medicines  Ask your health care provider about: ? Changing or stopping your regular medicines. This is especially important if you are taking diabetes medicines or blood thinners. ? Taking medicines such as aspirin and ibuprofen. These medicines can thin your blood. Do not take these medicines before your procedure if your health care provider instructs you not to. ? Taking new dietary supplements or medicines. Do not take these during the week before your procedure unless your health care provider approves them.  If you are told to take a medicine or to continue taking a medicine on the day of the procedure, take the medicine with sips of water. General instructions   Ask if you will be going home the same day, the following day, or after a longer hospital stay. ? Plan to have someone take you home. ? Plan to have someone stay with you for the first 24 hours after you leave the hospital or clinic.  For 3-6 weeks before the procedure, try not to use any tobacco products, such as cigarettes, chewing tobacco, and e-cigarettes.  You may brush your teeth on the morning of the procedure, but make sure to spit out the toothpaste. What happens during the procedure?  You will be given anesthetics through a mask and through an IV tube in one of your veins.  You may receive medicine to help you relax (sedative).  As soon as you are asleep, a breathing tube may be used to help you breathe.  An anesthesia specialist will stay with you throughout the procedure. He or she will help keep you comfortable and safe by continuing to give you medicines and adjusting the amount of medicine that you get. He or she will also watch your blood pressure, pulse, and oxygen levels to make sure that the anesthetics do not cause any  problems.  If a breathing tube was used to help you breathe, it will be removed before you wake up.  The procedure may vary among health care providers and hospitals. What happens after the procedure?  You will wake up, often slowly, after the procedure is complete, usually in a recovery area.  Your blood pressure, heart rate, breathing rate, and blood oxygen level will be monitored until the medicines you were given have worn off.  You may be given medicine to help you calm down if you feel anxious or agitated.  If you will be going home the same day, your health care provider may check to make sure you can stand, drink, and urinate.  Your health care providers will treat your pain and side effects before you go home.  Do not drive for 24 hours if you received a sedative.  You may: ? Feel nauseous and vomit. ? Have a sore throat. ? Have mental slowness. ? Feel cold or shivery. ? Feel sleepy. ? Feel tired. ? Feel sore or achy, even in parts of your body where you did not have surgery. This information is not intended to replace advice given to you by your health care provider. Make sure you discuss any questions you have with your health care provider. Document Released: 11/24/2007 Document Revised: 01/28/2016 Document Reviewed: 08/01/2015 Elsevier Interactive Patient Education  2018 [employer] Anesthesia, Adult, Care After These instructions provide you with information about caring for yourself after your procedure. Your health care provider may also give you more specific instructions. Your treatment has been planned according to current medical practices, but problems sometimes occur. Call your health care provider if you have any problems or questions after your procedure. What can I expect after the procedure? After the procedure, it is common to have:  Vomiting.  A sore throat.  Mental slowness.  It is common to feel:  Nauseous.  Cold or  shivery.  Sleepy.  Tired.  Sore or achy, even in parts of your body where you did not have surgery.  Follow these instructions at home: For at least 24 hours after the procedure:  Do not: ? Participate in activities where you could fall or become injured. ? Drive. ? Use heavy machinery. ? Drink alcohol. ? Take sleeping pills or medicines that cause drowsiness. ? Make important decisions or sign legal documents. ? Take care of children on your own.  Rest. Eating and drinking  If you vomit, drink water, juice, or soup when you can drink without vomiting.  Drink enough fluid to keep your urine clear or pale yellow.  Make sure you have little or no nausea before eating solid foods.  Follow the diet recommended by your health care provider. General instructions  Have a responsible adult stay with you until you are awake and alert.  Return to your normal activities as told by your health care provider. Ask your health care provider what activities are safe for you.  Take over-the-counter and prescription medicines only as told by your health care provider.  If you smoke, do not smoke without supervision.  Keep all follow-up visits as told by your health care provider. This is important. Contact a health care provider if:  You continue to have nausea or vomiting at home, and medicines are not helpful.  You cannot drink fluids or start eating again.  You cannot urinate after 8-12 hours.  You develop a skin rash.  You have fever.  You have increasing redness at the site of your procedure. Get help right away if:  You have difficulty breathing.  You have chest pain.  You have unexpected bleeding.  You feel that you are having a life-threatening or urgent problem. This information is not intended to replace advice given to you by your health care provider. Make sure you discuss any questions you have with your health care provider. Document Released: 11/23/2000  Document Revised: 01/20/2016 Document Reviewed: 08/01/2015 Elsevier Interactive Patient Education  [employer].

## 2017-10-22 ENCOUNTER — Other Ambulatory Visit: Payer: Self-pay | Admitting: Obstetrics & Gynecology

## 2017-10-22 ENCOUNTER — Other Ambulatory Visit: Payer: Self-pay

## 2017-10-22 ENCOUNTER — Encounter (HOSPITAL_COMMUNITY)
Admission: RE | Admit: 2017-10-22 | Discharge: 2017-10-22 | Disposition: A | Discharge status: Home / Self Care | Payer: Medicare Other | Source: Ambulatory Visit | Attending: Obstetrics & Gynecology | Admitting: Obstetrics & Gynecology

## 2017-10-22 ENCOUNTER — Encounter (HOSPITAL_COMMUNITY): Payer: Self-pay

## 2017-10-22 DIAGNOSIS — I440 Atrioventricular block, first degree: Secondary | ICD-10-CM | POA: Diagnosis not present

## 2017-10-22 DIAGNOSIS — Z01818 Encounter for other preprocedural examination: Secondary | ICD-10-CM | POA: Insufficient documentation

## 2017-10-22 HISTORY — DX: Other chronic pain: G89.29

## 2017-10-22 LAB — COMPREHENSIVE METABOLIC PANEL
ALBUMIN: 3.8 g/dL (ref 3.5–5.0)
ALT: 26 U/L (ref 14–54)
ANION GAP: 10 (ref 5–15)
AST: 28 U/L (ref 15–41)
Alkaline Phosphatase: 66 U/L (ref 38–126)
BUN: 18 mg/dL (ref 6–20)
CHLORIDE: 98 mmol/L — AB (ref 101–111)
CO2: 29 mmol/L (ref 22–32)
Calcium: 9.6 mg/dL (ref 8.9–10.3)
Creatinine, Ser: 0.9 mg/dL (ref 0.44–1.00)
GFR calc Af Amer: >60 mL/min (ref >60)
GFR calc non Af Amer: >60 mL/min (ref >60)
Glucose, Bld: 105 mg/dL — ABNORMAL HIGH (ref 65–99)
POTASSIUM: 3.6 mmol/L (ref 3.5–5.1)
SODIUM: 137 mmol/L (ref 135–145)
Total Bilirubin: 0.6 mg/dL (ref 0.3–1.2)
Total Protein: 8 g/dL (ref 6.5–8.1)

## 2017-10-22 LAB — URINALYSIS, ROUTINE W REFLEX MICROSCOPIC
BILIRUBIN URINE: NEGATIVE
Glucose, UA: NEGATIVE mg/dL
Hgb urine dipstick: NEGATIVE
KETONES UR: NEGATIVE mg/dL
Nitrite: NEGATIVE
PROTEIN: NEGATIVE mg/dL
SPECIFIC GRAVITY, URINE: 1.025 (ref 1.005–1.030)
pH: 5.5 (ref 5.0–8.0)

## 2017-10-22 LAB — RAPID HIV SCREEN (HIV 1/2 AB+AG)
HIV 1/2 Antibodies: NONREACTIVE
HIV-1 P24 Antigen - HIV24: NONREACTIVE

## 2017-10-22 LAB — ABO/RH: ABO/RH(D): A NEG

## 2017-10-22 LAB — CBC
HCT: 42.5 % (ref 36.0–46.0)
HEMOGLOBIN: 13.3 g/dL (ref 12.0–15.0)
MCH: 29.2 pg (ref 26.0–34.0)
MCHC: 31.3 g/dL (ref 30.0–36.0)
MCV: 93.4 fL (ref 78.0–100.0)
Platelets: 262 10*3/uL (ref 150–400)
RBC: 4.55 MIL/uL (ref 3.87–5.11)
RDW: 13.2 % (ref 11.5–15.5)
WBC: 5 10*3/uL (ref 4.0–10.5)

## 2017-10-22 LAB — TYPE AND SCREEN
ABO/RH(D): A NEG
ANTIBODY SCREEN: NEGATIVE

## 2017-10-22 LAB — URINALYSIS, MICROSCOPIC (REFLEX)
Bacteria, UA: NONE SEEN
RBC / HPF: NONE SEEN RBC/hpf (ref 0–5)

## 2017-10-27 ENCOUNTER — Inpatient Hospital Stay (HOSPITAL_COMMUNITY)
Admission: RE | Admit: 2017-10-27 | Discharge: 2017-10-28 | DRG: 743 | Disposition: A | Discharge status: Home / Self Care | Payer: Medicare Other | Source: Ambulatory Visit | Attending: Obstetrics & Gynecology | Admitting: Obstetrics & Gynecology

## 2017-10-27 ENCOUNTER — Inpatient Hospital Stay (HOSPITAL_COMMUNITY): Payer: Medicare Other | Admitting: Anesthesiology

## 2017-10-27 ENCOUNTER — Encounter (HOSPITAL_COMMUNITY)
Admission: RE | Disposition: A | Discharge status: Home / Self Care | Payer: Self-pay | Source: Ambulatory Visit | Attending: Obstetrics & Gynecology

## 2017-10-27 ENCOUNTER — Encounter (HOSPITAL_COMMUNITY): Payer: Self-pay | Admitting: *Deleted

## 2017-10-27 ENCOUNTER — Other Ambulatory Visit: Payer: Self-pay

## 2017-10-27 DIAGNOSIS — N840 Polyp of corpus uteri: Secondary | ICD-10-CM | POA: Diagnosis not present

## 2017-10-27 DIAGNOSIS — N838 Other noninflammatory disorders of ovary, fallopian tube and broad ligament: Secondary | ICD-10-CM | POA: Diagnosis present

## 2017-10-27 DIAGNOSIS — G8929 Other chronic pain: Secondary | ICD-10-CM | POA: Diagnosis not present

## 2017-10-27 DIAGNOSIS — E785 Hyperlipidemia, unspecified: Secondary | ICD-10-CM | POA: Diagnosis present

## 2017-10-27 DIAGNOSIS — Z9079 Acquired absence of other genital organ(s): Secondary | ICD-10-CM

## 2017-10-27 DIAGNOSIS — D259 Leiomyoma of uterus, unspecified: Secondary | ICD-10-CM | POA: Diagnosis present

## 2017-10-27 DIAGNOSIS — N8502 Endometrial intraepithelial neoplasia [EIN]: Principal | ICD-10-CM | POA: Diagnosis present

## 2017-10-27 DIAGNOSIS — Z8249 Family history of ischemic heart disease and other diseases of the circulatory system: Secondary | ICD-10-CM | POA: Diagnosis not present

## 2017-10-27 DIAGNOSIS — N8501 Benign endometrial hyperplasia: Secondary | ICD-10-CM | POA: Diagnosis not present

## 2017-10-27 DIAGNOSIS — Z87891 Personal history of nicotine dependence: Secondary | ICD-10-CM

## 2017-10-27 DIAGNOSIS — Z91040 Latex allergy status: Secondary | ICD-10-CM | POA: Diagnosis not present

## 2017-10-27 DIAGNOSIS — Z8349 Family history of other endocrine, nutritional and metabolic diseases: Secondary | ICD-10-CM | POA: Diagnosis not present

## 2017-10-27 DIAGNOSIS — Z90722 Acquired absence of ovaries, bilateral: Secondary | ICD-10-CM

## 2017-10-27 DIAGNOSIS — I10 Essential (primary) hypertension: Secondary | ICD-10-CM | POA: Diagnosis not present

## 2017-10-27 DIAGNOSIS — Z90710 Acquired absence of both cervix and uterus: Secondary | ICD-10-CM

## 2017-10-27 DIAGNOSIS — D251 Intramural leiomyoma of uterus: Secondary | ICD-10-CM | POA: Diagnosis not present

## 2017-10-27 DIAGNOSIS — D252 Subserosal leiomyoma of uterus: Secondary | ICD-10-CM | POA: Diagnosis not present

## 2017-10-27 HISTORY — PX: SALPINGOOPHORECTOMY: SHX82

## 2017-10-27 HISTORY — PX: ABDOMINAL HYSTERECTOMY: SHX81

## 2017-10-27 SURGERY — HYSTERECTOMY, ABDOMINAL
Anesthesia: General | Site: Abdomen

## 2017-10-27 MED ORDER — FENTANYL CITRATE (PF) 100 MCG/2ML IJ SOLN
INTRAMUSCULAR | Status: AC
  Filled 2017-10-27: qty 2

## 2017-10-27 MED ORDER — OXYCODONE HCL 5 MG PO TABS: 5.0000 mg | ORAL_TABLET | Freq: Once | ORAL | Status: DC | PRN

## 2017-10-27 MED ORDER — POTASSIUM CHLORIDE CRYS ER 10 MEQ PO TBCR
10.0000 meq | EXTENDED_RELEASE_TABLET | Freq: Every day | ORAL | Status: DC
  Administered 2017-10-27 – 2017-10-28 (×2): 10 meq via ORAL
  Filled 2017-10-27 (×2): qty 1

## 2017-10-27 MED ORDER — HYDROMORPHONE HCL 1 MG/ML IJ SOLN
0.2500 mg | INTRAMUSCULAR | Status: DC | PRN
  Administered 2017-10-27 (×6): 0.5 mg via INTRAVENOUS
  Filled 2017-10-27 (×2): qty 0.5

## 2017-10-27 MED ORDER — KCL IN DEXTROSE-NACL 20-5-0.45 MEQ/L-%-% IV SOLN
INTRAVENOUS | Status: AC
  Administered 2017-10-27 (×2): via INTRAVENOUS

## 2017-10-27 MED ORDER — ATENOLOL 25 MG PO TABS
50.0000 mg | ORAL_TABLET | Freq: Every day | ORAL | Status: DC
  Administered 2017-10-27 – 2017-10-28 (×2): 50 mg via ORAL
  Filled 2017-10-27 (×2): qty 2

## 2017-10-27 MED ORDER — OXYCODONE HCL 5 MG/5ML PO SOLN: 5.0000 mg | Freq: Once | ORAL | Status: DC | PRN

## 2017-10-27 MED ORDER — SENNOSIDES-DOCUSATE SODIUM 8.6-50 MG PO TABS
1.0000 | ORAL_TABLET | Freq: Every evening | ORAL | Status: DC | PRN
  Administered 2017-10-28: 1 via ORAL
  Filled 2017-10-27: qty 1

## 2017-10-27 MED ORDER — PROMETHAZINE HCL 25 MG/ML IJ SOLN
25.0000 mg | Freq: Four times a day (QID) | INTRAMUSCULAR | Status: DC | PRN
  Administered 2017-10-27: 25 mg via INTRAVENOUS
  Filled 2017-10-27: qty 1

## 2017-10-27 MED ORDER — HYDROMORPHONE HCL 1 MG/ML IJ SOLN
INTRAMUSCULAR | Status: AC
  Filled 2017-10-27: qty 0.5

## 2017-10-27 MED ORDER — SUGAMMADEX SODIUM 500 MG/5ML IV SOLN
INTRAVENOUS | Status: AC
  Filled 2017-10-27: qty 5

## 2017-10-27 MED ORDER — DOCUSATE SODIUM 100 MG PO CAPS
100.0000 mg | ORAL_CAPSULE | Freq: Two times a day (BID) | ORAL | Status: DC
  Administered 2017-10-27 – 2017-10-28 (×3): 100 mg via ORAL
  Filled 2017-10-27 (×3): qty 1

## 2017-10-27 MED ORDER — BUPIVACAINE LIPOSOME 1.3 % IJ SUSP
INTRAMUSCULAR | Status: AC
  Filled 2017-10-27: qty 20

## 2017-10-27 MED ORDER — FENTANYL CITRATE (PF) 100 MCG/2ML IJ SOLN
INTRAMUSCULAR | Status: DC | PRN
  Administered 2017-10-27 (×3): 50 ug via INTRAVENOUS
  Administered 2017-10-27: 100 ug via INTRAVENOUS
  Administered 2017-10-27 (×2): 50 ug via INTRAVENOUS

## 2017-10-27 MED ORDER — ARTIFICIAL TEARS OPHTHALMIC OINT
TOPICAL_OINTMENT | OPHTHALMIC | Status: DC | PRN
  Administered 2017-10-27: 1 via OPHTHALMIC

## 2017-10-27 MED ORDER — OXYCODONE-ACETAMINOPHEN 7.5-325 MG PO TABS
1.0000 | ORAL_TABLET | ORAL | Status: DC | PRN
Start: 2017-10-27 — End: 2017-10-28
  Filled 2017-10-27: qty 2

## 2017-10-27 MED ORDER — HYDROMORPHONE HCL 1 MG/ML IJ SOLN
INTRAMUSCULAR | Status: AC
Start: 2017-10-27 — End: 2017-10-27
  Filled 2017-10-27: qty 0.5

## 2017-10-27 MED ORDER — HYDROMORPHONE HCL 1 MG/ML IJ SOLN: 1.0000 mg | INTRAMUSCULAR | Status: DC | PRN

## 2017-10-27 MED ORDER — SODIUM CHLORIDE 0.9 % IV SOLN
INTRAVENOUS | Status: DC | PRN
  Administered 2017-10-27: 60 mL

## 2017-10-27 MED ORDER — BISACODYL 10 MG RE SUPP: 10.0000 mg | Freq: Every day | RECTAL | Status: DC | PRN

## 2017-10-27 MED ORDER — SODIUM CHLORIDE 0.9 % IR SOLN
Status: DC | PRN
  Administered 2017-10-27: 1000 mL
  Administered 2017-10-27: 2000 mL

## 2017-10-27 MED ORDER — ROCURONIUM BROMIDE 100 MG/10ML IV SOLN
INTRAVENOUS | Status: DC | PRN
  Administered 2017-10-27: 50 mg via INTRAVENOUS
  Administered 2017-10-27: 20 mg via INTRAVENOUS

## 2017-10-27 MED ORDER — FENTANYL CITRATE (PF) 250 MCG/5ML IJ SOLN
INTRAMUSCULAR | Status: AC
  Filled 2017-10-27: qty 5

## 2017-10-27 MED ORDER — ONDANSETRON 4 MG PO TBDP
4.0000 mg | ORAL_TABLET | Freq: Once | ORAL | Status: AC
  Administered 2017-10-27: 4 mg via ORAL

## 2017-10-27 MED ORDER — LIDOCAINE HCL (CARDIAC) 20 MG/ML IV SOLN
INTRAVENOUS | Status: DC | PRN
  Administered 2017-10-27: 40 mg via INTRAVENOUS

## 2017-10-27 MED ORDER — LACTATED RINGERS IV SOLN
INTRAVENOUS | Status: DC
  Administered 2017-10-27 (×3): via INTRAVENOUS

## 2017-10-27 MED ORDER — ZOLPIDEM TARTRATE 5 MG PO TABS: 5.0000 mg | ORAL_TABLET | Freq: Every evening | ORAL | Status: DC | PRN

## 2017-10-27 MED ORDER — ROCURONIUM BROMIDE 50 MG/5ML IV SOLN
INTRAVENOUS | Status: AC
  Filled 2017-10-27: qty 1

## 2017-10-27 MED ORDER — SODIUM CHLORIDE 0.9 % IJ SOLN
INTRAMUSCULAR | Status: AC
  Filled 2017-10-27: qty 40

## 2017-10-27 MED ORDER — ALUM & MAG HYDROXIDE-SIMETH 200-200-20 MG/5ML PO SUSP
30.0000 mL | ORAL | Status: DC | PRN
  Administered 2017-10-28 (×2): 30 mL via ORAL
  Filled 2017-10-27 (×2): qty 30

## 2017-10-27 MED ORDER — PROPOFOL 10 MG/ML IV BOLUS
INTRAVENOUS | Status: AC
  Filled 2017-10-27: qty 40

## 2017-10-27 MED ORDER — BUPIVACAINE LIPOSOME 1.3 % IJ SUSP
20.0000 mL | Freq: Once | INTRAMUSCULAR | Status: DC
  Filled 2017-10-27: qty 20

## 2017-10-27 MED ORDER — KETOROLAC TROMETHAMINE 30 MG/ML IJ SOLN
30.0000 mg | Freq: Once | INTRAMUSCULAR | Status: AC
  Administered 2017-10-27: 30 mg via INTRAVENOUS
  Filled 2017-10-27: qty 1

## 2017-10-27 MED ORDER — ONDANSETRON 4 MG PO TBDP
ORAL_TABLET | ORAL | Status: AC
  Filled 2017-10-27: qty 1

## 2017-10-27 MED ORDER — LIDOCAINE HCL (PF) 1 % IJ SOLN
INTRAMUSCULAR | Status: AC
  Filled 2017-10-27: qty 5

## 2017-10-27 MED ORDER — ONDANSETRON HCL 8 MG PO TABS
8.0000 mg | ORAL_TABLET | Freq: Four times a day (QID) | ORAL | Status: DC | PRN
  Filled 2017-10-27: qty 1

## 2017-10-27 MED ORDER — HYDROCHLOROTHIAZIDE 25 MG PO TABS
25.0000 mg | ORAL_TABLET | Freq: Every day | ORAL | Status: DC
  Administered 2017-10-27 – 2017-10-28 (×2): 25 mg via ORAL
  Filled 2017-10-27 (×2): qty 1

## 2017-10-27 MED ORDER — MIDAZOLAM HCL 2 MG/2ML IJ SOLN
1.0000 mg | Freq: Once | INTRAMUSCULAR | Status: AC | PRN
  Administered 2017-10-27: 2 mg via INTRAVENOUS

## 2017-10-27 MED ORDER — ACETAMINOPHEN 325 MG PO TABS
ORAL_TABLET | ORAL | Status: AC
  Filled 2017-10-27: qty 2

## 2017-10-27 MED ORDER — PROPOFOL 10 MG/ML IV BOLUS
INTRAVENOUS | Status: DC | PRN
  Administered 2017-10-27: 150 mg via INTRAVENOUS

## 2017-10-27 MED ORDER — SUCCINYLCHOLINE CHLORIDE 20 MG/ML IJ SOLN
INTRAMUSCULAR | Status: AC
  Filled 2017-10-27: qty 1

## 2017-10-27 MED ORDER — CEFAZOLIN SODIUM-DEXTROSE 2-4 GM/100ML-% IV SOLN
2.0000 g | INTRAVENOUS | Status: AC
  Administered 2017-10-27: 2 g via INTRAVENOUS
  Filled 2017-10-27: qty 100

## 2017-10-27 MED ORDER — ATENOLOL-CHLORTHALIDONE 50-25 MG PO TABS: 1.0000 | ORAL_TABLET | Freq: Every day | ORAL | Status: DC

## 2017-10-27 MED ORDER — KETOROLAC TROMETHAMINE 30 MG/ML IJ SOLN
30.0000 mg | Freq: Four times a day (QID) | INTRAMUSCULAR | Status: AC
  Administered 2017-10-27 – 2017-10-28 (×3): 30 mg via INTRAVENOUS
  Filled 2017-10-27 (×3): qty 1

## 2017-10-27 MED ORDER — SODIUM CHLORIDE 0.9 % IV SOLN
8.0000 mg | Freq: Four times a day (QID) | INTRAVENOUS | Status: DC | PRN
  Filled 2017-10-27: qty 4

## 2017-10-27 MED ORDER — SUGAMMADEX SODIUM 500 MG/5ML IV SOLN
INTRAVENOUS | Status: DC | PRN
  Administered 2017-10-27: 432 mg via INTRAVENOUS

## 2017-10-27 MED ORDER — ACETAMINOPHEN 325 MG PO TABS
650.0000 mg | ORAL_TABLET | Freq: Once | ORAL | Status: AC
  Administered 2017-10-27: 650 mg via ORAL

## 2017-10-27 MED ORDER — HEMOSTATIC AGENTS (NO CHARGE) OPTIME
TOPICAL | Status: DC | PRN
  Administered 2017-10-27: 1 via TOPICAL

## 2017-10-27 MED ORDER — MIDAZOLAM HCL 2 MG/2ML IJ SOLN
INTRAMUSCULAR | Status: AC
  Filled 2017-10-27: qty 2

## 2017-10-27 MED ORDER — LORAZEPAM 2 MG/ML IJ SOLN: 1.0000 mg | Freq: Once | INTRAMUSCULAR | Status: DC

## 2017-10-27 SURGICAL SUPPLY — 53 items
APPLIER CLIP 13 LRG OPEN (CLIP)
BAG HAMPER (MISCELLANEOUS) ×4 IMPLANT
CELLS DAT CNTRL 66122 CELL SVR (MISCELLANEOUS) IMPLANT
CLIP APPLIE 13 LRG OPEN (CLIP) IMPLANT
CLOTH BEACON ORANGE TIMEOUT ST (SAFETY) ×4 IMPLANT
COVER LIGHT HANDLE STERIS (MISCELLANEOUS) ×8 IMPLANT
DERMABOND ADVANCED (GAUZE/BANDAGES/DRESSINGS) ×2
DERMABOND ADVANCED .7 DNX12 (GAUZE/BANDAGES/DRESSINGS) ×2 IMPLANT
DRAPE WARM FLUID 44X44 (DRAPE) ×4 IMPLANT
DRSG OPSITE POSTOP 4X10 (GAUZE/BANDAGES/DRESSINGS) ×4 IMPLANT
DRSG OPSITE POSTOP 4X8 (GAUZE/BANDAGES/DRESSINGS) ×4 IMPLANT
DRSG TELFA 3X8 NADH (GAUZE/BANDAGES/DRESSINGS) ×4 IMPLANT
ELECT REM PT RETURN 9FT ADLT (ELECTROSURGICAL) ×4
ELECTRODE REM PT RTRN 9FT ADLT (ELECTROSURGICAL) ×2 IMPLANT
GAUZE SPONGE 4X4 12PLY STRL (GAUZE/BANDAGES/DRESSINGS) ×4 IMPLANT
GLOVE BIOGEL PI IND STRL 6.5 (GLOVE) ×2 IMPLANT
GLOVE BIOGEL PI IND STRL 7.0 (GLOVE) ×6 IMPLANT
GLOVE BIOGEL PI IND STRL 8 (GLOVE) ×2 IMPLANT
GLOVE BIOGEL PI INDICATOR 6.5 (GLOVE) ×2
GLOVE BIOGEL PI INDICATOR 7.0 (GLOVE) ×6
GLOVE BIOGEL PI INDICATOR 8 (GLOVE) ×2
GLOVE SURG SS PI 6.5 STRL IVOR (GLOVE) ×8 IMPLANT
GLOVE SURG SS PI 7.0 STRL IVOR (GLOVE) ×4 IMPLANT
GLOVE SURG SS PI 8.0 STRL IVOR (GLOVE) ×4 IMPLANT
GOWN STRL REUS W/TWL LRG LVL3 (GOWN DISPOSABLE) ×8 IMPLANT
GOWN STRL REUS W/TWL XL LVL3 (GOWN DISPOSABLE) ×4 IMPLANT
HEMOSTAT ARISTA ABSORB 3G PWDR (MISCELLANEOUS) ×4 IMPLANT
INST SET MAJOR GENERAL (KITS) ×4 IMPLANT
KIT TURNOVER KIT A (KITS) ×4 IMPLANT
MANIFOLD NEPTUNE II (INSTRUMENTS) ×4 IMPLANT
NEEDLE HYPO 21X1.5 SAFETY (NEEDLE) ×4 IMPLANT
NS IRRIG 1000ML POUR BTL (IV SOLUTION) ×12 IMPLANT
PACK ABDOMINAL MAJOR (CUSTOM PROCEDURE TRAY) ×4 IMPLANT
PAD ABD 5X9 TENDERSORB (GAUZE/BANDAGES/DRESSINGS) ×4 IMPLANT
PAD ARMBOARD 7.5X6 YLW CONV (MISCELLANEOUS) ×4 IMPLANT
RETRACTOR WND ALEXIS 25 LRG (MISCELLANEOUS) ×2 IMPLANT
RTRCTR WOUND ALEXIS 18CM MED (MISCELLANEOUS)
RTRCTR WOUND ALEXIS 25CM LRG (MISCELLANEOUS) ×4
SET BASIN LINEN APH (SET/KITS/TRAYS/PACK) ×4 IMPLANT
SPONGE LAP 18X18 X RAY DECT (DISPOSABLE) ×4 IMPLANT
SUT CHROMIC 0 CT 1 (SUTURE) ×8 IMPLANT
SUT MON AB 3-0 SH 27 (SUTURE) ×4 IMPLANT
SUT PLAIN 2 0 XLH (SUTURE) IMPLANT
SUT VIC AB 0 CT1 27 (SUTURE) ×8
SUT VIC AB 0 CT1 27XBRD ANTBC (SUTURE) ×2 IMPLANT
SUT VIC AB 0 CT1 27XCR 8 STRN (SUTURE) ×6 IMPLANT
SUT VIC AB 0 CTX 36 (SUTURE) ×2
SUT VIC AB 0 CTX36XBRD ANTBCTR (SUTURE) ×2 IMPLANT
SUT VICRYL 3 0 (SUTURE) ×4 IMPLANT
SYR 20CC LL (SYRINGE) ×4 IMPLANT
TAPE PAPER 3X10 WHT MICROPORE (GAUZE/BANDAGES/DRESSINGS) ×4 IMPLANT
TOWEL BLUE STERILE X RAY DET (MISCELLANEOUS) ×4 IMPLANT
TRAY FOLEY CATH SILVER 16FR LF (SET/KITS/TRAYS/PACK) ×4 IMPLANT

## 2017-10-27 NOTE — Op Note (Signed)
Preoperative diagnosis:  1.  Complex  Atypical endometrial hyperplasia                                         2.  18 weeks size fibroid uterus                                         3.                                           4.    Postoperative diagnosis:  Same as above + left paratubal cyst  Procedure:  Total abdominal hysterectomy with bilateral salpingoophorectomy  Surgeon:  Florian Buff  Assistant:    Anesthesia:  General endotracheal  Preoperative clinical summary:  EMB revealed CAH of the endometrium, no carcinoma identified, enlarged fibroid uters  Intraoperative findings: normal anatomy except fibroids and left paratubal cyst, no peritoneal abnormalities  Description of operation:  Patient was taken to the operating room and placed in the supine position where she underwent general endotracheal anesthesia.  She was then prepped and draped in the usual sterile fashion and a Foley catheter was placed for continuous bladder drainage.  A Pfannenstiel skin incision was made and carried down sharply to the rectus fascia which was scored in the midline and extended laterally.  The fascia was taken off the muscles superiorly and inferiorly without difficulty.  The muscles were divided.  The peritoneal cavity was entered.  An large Alexis self-retaining retractor was placed.  The upper abdomen was packed away. Both uterine cornu were grasped with Coker clamps.  The left round ligament was suture ligated and coagulated with the electrocautery unit.  The left vesicouterine serosal flap was created.  An avascular window in in the peritoneum was created and the infundibulo pelvic ligament was cross clamped, cut and suture ligated.  The right round ligament was suture ligated and cut with the electrocautery unit.  The vesicouterine serosal flap on the right was created.  An avascular window in the peritoneum was created and the right infundibulo pelvic ligament was cross clamped, cut and double  suture ligated.  Thus both ovaries and tubes were removed. The uterine vessels were skeletonized bilaterally.  The uterine vessels were clamped bilaterally,  then cut and suture ligated.  Two more pedicles were taken down the cervix medial to the uterine vessels.  Each pedicle was clamped cut and suture ligated with good resulting hemostasis.  The vagina was cross clamped and the uterus and cervix were removed intact.  Vaginal angle sutures were placed and the vagina was closed with interrupted figure of eight sutures.  The pelvis was irrigated vigorously and all pedicles were examined and found to be hemostatic.  Arista was placed for additional peritoneal surface hemostasis.  All specimens were sent to pathology for routine evaluation.  The Alexis self-retaining retractor was removed and the pelvis was irrigated vigorously.  All packs were removed and all counts were correct at this point x 3.  The muscles and peritoneum were reapproximated loosely.  The fascia was closed with 0 Vicryl running.    The skin was closed using 3-0 Vicryl on a Keith needle in a subcuticular fashion.  Dermabond was then applied for additional wound integrity and to serve as a postoperative bacterial barrier.  The patient was awakened from anesthesia taken to the recovery room in good stable condition. All sponge instrument and needle counts were correct x 3.  The patient received Ancef and Toradol prophylactically preoperatively.    Estimated blood loss for the procedure was 150  cc.  Yesenia Montgomery H 10/27/2017 10:21 AM

## 2017-10-27 NOTE — Anesthesia Postprocedure Evaluation (Signed)
Anesthesia Post Note  Patient: Yesenia Montgomery  Procedure(s) Performed: TOTAL ABDOMINAL HYSTERECTOMY (N/A Abdomen) BILATERAL SALPINGO OOPHORECTOMY (Bilateral Abdomen)  Patient location during evaluation: PACU Anesthesia Type: General Level of consciousness: awake and alert, oriented and patient cooperative Pain management: pain level controlled Vital Signs Assessment: post-procedure vital signs reviewed and stable Respiratory status: spontaneous breathing, respiratory function stable and patient connected to face mask oxygen Cardiovascular status: stable Postop Assessment: no apparent nausea or vomiting Anesthetic complications: no     Last Vitals:  Vitals:   10/27/17 1030 10/27/17 1045  BP: (!) 161/91 (!) 160/90  Pulse: (!) 57 (!) 59  Resp: 18 (!) 7  Temp:    SpO2: 100% 100%    Last Pain:  Vitals:   10/27/17 0653  TempSrc: Oral                 Bev Drennen A

## 2017-10-27 NOTE — Transfer of Care (Signed)
Immediate Anesthesia Transfer of Care Note  Patient: Yesenia Montgomery  Procedure(s) Performed: TOTAL ABDOMINAL HYSTERECTOMY (N/A Abdomen) BILATERAL SALPINGO OOPHORECTOMY (Bilateral Abdomen)  Patient Location: PACU  Anesthesia Type:General  Level of Consciousness: awake, oriented and patient cooperative  Airway & Oxygen Therapy: Patient Spontanous Breathing and Patient connected to face mask oxygen  Post-op Assessment: Report given to RN and Post -op Vital signs reviewed and stable  Post vital signs: Reviewed and stable  Last Vitals:  Vitals:   10/27/17 0800 10/27/17 0815  BP: 139/86 139/83  Resp: 13 17  Temp:    SpO2: 100% 100%    Last Pain:  Vitals:   10/27/17 0653  TempSrc: Oral      Patients Stated Pain Goal: 3 (65/78/46 9629)  Complications: No apparent anesthesia complications

## 2017-10-27 NOTE — Anesthesia Procedure Notes (Signed)
Procedure Name: Intubation Date/Time: 10/27/2017 8:35 AM Performed by: Andree Elk, Amy A, CRNA Pre-anesthesia Checklist: Patient identified, Patient being monitored, Timeout performed, Emergency Drugs available and Suction available Patient Re-evaluated:Patient Re-evaluated prior to induction Oxygen Delivery Method: Circle System Utilized Preoxygenation: Pre-oxygenation with 100% oxygen Induction Type: IV induction Ventilation: Mask ventilation without difficulty Laryngoscope Size: Mac and 3 Grade View: Grade I Tube type: Oral Tube size: 7.0 mm Number of attempts: 1 Airway Equipment and Method: Stylet Placement Confirmation: ETT inserted through vocal cords under direct vision,  positive ETCO2 and breath sounds checked- equal and bilateral Secured at: 21 cm Tube secured with: Tape Dental Injury: Teeth and Oropharynx as per pre-operative assessment

## 2017-10-27 NOTE — H&P (Signed)
Preoperative History and Physical  Yesenia Montgomery is a 62 y.o. [id number] with No LMP recorded. Patient is postmenopausal. admitted for a TAH BSO.  Pathology: Complex endometrial Hyperplasia with atypia Endometrial polyp prolapsed was negative/benign Fibroids 18 weeks size     Impression: Complex atypical endometrial hyperplasia  Fibroids    Plan: Will need total abdominal hysterectomy with BSO Pt aware final pathology could indeed include endometrial cancer although if is will generally be Grade I with confinement to the endometrium  Will plan for TAH BSO 10/27/2017     PMH:    Past Medical History:  Diagnosis Date  . Arthritis    knees  . Chronic pain   . HLD (hyperlipidemia) 03/30/2017  . Hypertension     PSH:     Past Surgical History:  Procedure Laterality Date  . TONSILLECTOMY      POb/GynH:      OB History    Gravida Para Term Preterm AB Living   3 2 2   1 2    SAB TAB Ectopic Multiple Live Births                  SH:   Social History   Tobacco Use  . Smoking status: Former Smoker    Packs/day: 0.25    Years: 5.00    Pack years: 1.25    Types: Cigarettes    Last attempt to quit: 10/23/1983    Years since quitting: 34.0  . Smokeless tobacco: Never Used  Substance Use Topics  . Alcohol use: No  . Drug use: No    FH:    Family History  Problem Relation Age of Onset  . Stroke Mother   . Heart disease Mother   . Hypertension Mother   . Hyperlipidemia Mother   . Early death Father        bedridden by age of 67 ?  Marland Kitchen Alcohol abuse Father   . Cancer Paternal Aunt   . Diabetes Cousin      Allergies:  Allergies  Allergen Reactions  . Latex Rash    Medications:       Current Facility-Administered Medications:  .  bupivacaine liposome (EXPAREL) 1.3 % injection 266 mg, 20 mL, Infiltration, Once, Rydge Texidor H, MD .  ceFAZolin (ANCEF) IVPB 2g/100 mL premix, 2 g, Intravenous, On Call to OR, Florian Buff, MD .  lactated ringers  infusion, , Intravenous, Continuous, Mikey College, MD, Last Rate: 10 mL/hr at 10/27/17 9476  Review of Systems:   Review of Systems  Constitutional: Negative for fever, chills, weight loss, malaise/fatigue and diaphoresis.  HENT: Negative for hearing loss, ear pain, nosebleeds, congestion, sore throat, neck pain, tinnitus and ear discharge.   Eyes: Negative for blurred vision, double vision, photophobia, pain, discharge and redness.  Respiratory: Negative for cough, hemoptysis, sputum production, shortness of breath, wheezing and stridor.   Cardiovascular: Negative for chest pain, palpitations, orthopnea, claudication, leg swelling and PND.  Gastrointestinal: Positive for abdominal pain. Negative for heartburn, nausea, vomiting, diarrhea, constipation, blood in stool and melena.  Genitourinary: Negative for dysuria, urgency, frequency, hematuria and flank pain.  Musculoskeletal: Negative for myalgias, back pain, joint pain and falls.  Skin: Negative for itching and rash.  Neurological: Negative for dizziness, tingling, tremors, sensory change, speech change, focal weakness, seizures, loss of consciousness, weakness and headaches.  Endo/Heme/Allergies: Negative for environmental allergies and polydipsia. Does not bruise/bleed easily.  Psychiatric/Behavioral: Negative for depression, suicidal ideas, hallucinations, memory loss and substance abuse.  The patient is not nervous/anxious and does not have insomnia.      PHYSICAL EXAM:  Blood pressure (!) 156/91, temperature 98.1 F (36.7 C), temperature source Oral, resp. rate 15, SpO2 97 %.    Vitals reviewed. Constitutional: She is oriented to person, place, and time. She appears well-developed and well-nourished.  HENT:  Head: Normocephalic and atraumatic.  Right Ear: External ear normal.  Left Ear: External ear normal.  Nose: Nose normal.  Mouth/Throat: Oropharynx is clear and moist.  Eyes: Conjunctivae and EOM are normal. Pupils are  equal, round, and reactive to light. Right eye exhibits no discharge. Left eye exhibits no discharge. No scleral icterus.  Neck: Normal range of motion. Neck supple. No tracheal deviation present. No thyromegaly present.  Cardiovascular: Normal rate, regular rhythm, normal heart sounds and intact distal pulses.  Exam reveals no gallop and no friction rub.   No murmur heard. Respiratory: Effort normal and breath sounds normal. No respiratory distress. She has no wheezes. She has no rales. She exhibits no tenderness.  GI: Soft. Bowel sounds are normal. She exhibits no distension and no mass. There is tenderness. There is no rebound and no guarding.  Genitourinary:       Vulva is normal without lesions Vagina is pink moist without discharge Cervix normal in appearance and pap is normal Uterus is 18 weeks size Adnexa is negative with normal sized ovaries by sonogram  Musculoskeletal: Normal range of motion. She exhibits no edema and no tenderness.  Neurological: She is alert and oriented to person, place, and time. She has normal reflexes. She displays normal reflexes. No cranial nerve deficit. She exhibits normal muscle tone. Coordination normal.  Skin: Skin is warm and dry. No rash noted. No erythema. No pallor.  Psychiatric: She has a normal mood and affect. Her behavior is normal. Judgment and thought content normal.    Labs: Results for orders placed or performed during the hospital encounter of 10/22/17 (from the past 336 hour(s))  CBC   Collection Time: 10/22/17  9:04 AM  Result Value Ref Range   WBC 5.0 4.0 - 10.5 K/uL   RBC 4.55 3.87 - 5.11 MIL/uL   Hemoglobin 13.3 12.0 - 15.0 g/dL   HCT 42.5 36.0 - 46.0 %   MCV 93.4 78.0 - 100.0 fL   MCH 29.2 26.0 - 34.0 pg   MCHC 31.3 30.0 - 36.0 g/dL   RDW 13.2 11.5 - 15.5 %   Platelets 262 150 - 400 K/uL  Comprehensive metabolic panel   Collection Time: 10/22/17  9:04 AM  Result Value Ref Range   Sodium 137 135 - 145 mmol/L   Potassium  3.6 3.5 - 5.1 mmol/L   Chloride 98 (L) 101 - 111 mmol/L   CO2 29 22 - 32 mmol/L   Glucose, Bld 105 (H) 65 - 99 mg/dL   BUN 18 6 - 20 mg/dL   Creatinine, Ser 0.90 0.44 - 1.00 mg/dL   Calcium 9.6 8.9 - 10.3 mg/dL   Total Protein 8.0 6.5 - 8.1 g/dL   Albumin 3.8 3.5 - 5.0 g/dL   AST 28 15 - 41 U/L   ALT 26 14 - 54 U/L   Alkaline Phosphatase 66 38 - 126 U/L   Total Bilirubin 0.6 0.3 - 1.2 mg/dL   GFR calc non Af Amer >60 >60 mL/min   GFR calc Af Amer >60 >60 mL/min   Anion gap 10 5 - 15  Rapid HIV screen (HIV 1/2 Ab+Ag)  Collection Time: 10/22/17  9:04 AM  Result Value Ref Range   HIV-1 P24 Antigen - HIV24 NON REACTIVE NON REACTIVE   HIV 1/2 Antibodies NON REACTIVE NON REACTIVE   Interpretation (HIV Ag Ab)      A non reactive test result means that HIV 1 or HIV 2 antibodies and HIV 1 p24 antigen were not detected in the specimen.  Urinalysis, Routine w reflex microscopic   Collection Time: 10/22/17  9:04 AM  Result Value Ref Range   Color, Urine YELLOW YELLOW   APPearance CLEAR CLEAR   Specific Gravity, Urine 1.025 1.005 - 1.030   pH 5.5 5.0 - 8.0   Glucose, UA NEGATIVE NEGATIVE mg/dL   Hgb urine dipstick NEGATIVE NEGATIVE   Bilirubin Urine NEGATIVE NEGATIVE   Ketones, ur NEGATIVE NEGATIVE mg/dL   Protein, ur NEGATIVE NEGATIVE mg/dL   Nitrite NEGATIVE NEGATIVE   Leukocytes, UA TRACE (A) NEGATIVE  Urinalysis, Microscopic (reflex)   Collection Time: 10/22/17  9:04 AM  Result Value Ref Range   RBC / HPF NONE SEEN 0 - 5 RBC/hpf   WBC, UA 0-5 0 - 5 WBC/hpf   Bacteria, UA NONE SEEN NONE SEEN   Squamous Epithelial / LPF 0-5 (A) NONE SEEN  Type and screen   Collection Time: 10/22/17  9:04 AM  Result Value Ref Range   ABO/RH(D) A NEG    Antibody Screen NEG    Sample Expiration      10/25/2017 Performed at [facility], [address] [place], [place] [address]   ABO/Rh   Collection Time: 10/22/17  9:04 AM  Result Value Ref Range   ABO/RH(D)      A NEG Performed at [facility], [address], [place], [place] [address]     EKG: Orders placed or performed during the hospital encounter of 10/22/17  . EKG 12-Lead  . EKG 12-Lead    Imaging Studies: No results found.    Assessment: Endometrial hyperplasia, complex, atypical(pt aware may have foci of endometrial cancer as well) Patient Active Problem List   Diagnosis Date Noted  . Fibroids 09/07/2017  . PMB (postmenopausal bleeding) 09/07/2017  . HLD (hyperlipidemia) 03/30/2017  . Pre-diabetes 03/26/2017  . Essential hypertension 03/04/2017  . Morbid obesity ([place]) 03/04/2017  . Elevated blood sugar level 03/04/2017  . 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  The patient also understands the alternative treatment options which were discussed in full.  All questions were answered.  Florian Buff 10/27/2017 7:59 AM   Florian Buff 10/27/2017 7:59 AM

## 2017-10-27 NOTE — Anesthesia Preprocedure Evaluation (Signed)
Anesthesia Evaluation  Patient identified by MRN, date of birth, ID band Patient awake    Airway Mallampati: I  TM Distance: >3 FB Neck ROM: Full    Dental no notable dental hx. (+) Teeth Intact   Pulmonary neg pulmonary ROS, former smoker,    Pulmonary exam normal breath sounds clear to auscultation       Cardiovascular Exercise Tolerance: Good METS ("Active"  does "Zumba", not limited by CP/SOB): 7 - 9 Mets hypertension, Pt. on medications Normal cardiovascular exam Rhythm:Regular Rate:Normal  FEB-2019 09:01:39 [facility]-AP-300 ROUTINE RECORD Sinus rhythm with 1st degree A-V block Otherwise normal ECG   Neuro/Psych negative psych ROS   GI/Hepatic negative GI ROS, Neg liver ROS,   Endo/Other  negative endocrine ROS  Renal/GU negative Renal ROSResults for Yesenia Montgomery, Yesenia Montgomery (MRN [id number]) as of 10/27/2017 06:54  10/22/2017 09:04 Sodium: 137 Potassium: 3.6 Chloride: 98 (L) CO2: 29 Glucose: 105 (H) BUN: 18 Creatinine: 0.90      Musculoskeletal  (+) Arthritis , Osteoarthritis,    Abdominal Normal abdominal exam  (+) + obese,   Peds  Hematology negative hematology ROS (+) Results for Yesenia Montgomery, Yesenia Montgomery (MRN [id number]) as of 10/27/2017 06:54  10/22/2017 09:04 WBC: 5.0 RBC: 4.55 Hemoglobin: 13.3 HCT: 42.5 MCV: 93.4 MCH: 29.2 MCHC: 31.3 RDW: 13.2 Platelets: 262    Anesthesia Other Findings   Reproductive/Obstetrics                             Anesthesia Physical Anesthesia Plan  ASA: II  Anesthesia Plan: General   Post-op Pain Management:    Induction: Intravenous  PONV Risk Score and Plan:   Airway Management Planned: Oral ETT  Additional Equipment:   Intra-op Plan:   Post-operative Plan: Extubation in OR  Informed Consent: I have reviewed the patients History and Physical, chart, labs and discussed the procedure including the risks, benefits and alternatives  for the proposed anesthesia with the patient or authorized representative who has indicated his/her understanding and acceptance.   Dental advisory given  Plan Discussed with: CRNA and Surgeon  Anesthesia Plan Comments:         Anesthesia Quick Evaluation

## 2017-10-28 ENCOUNTER — Encounter (HOSPITAL_COMMUNITY): Payer: Self-pay | Admitting: Obstetrics & Gynecology

## 2017-10-28 LAB — CBC
HCT: 34.7 % — ABNORMAL LOW (ref 36.0–46.0)
Hemoglobin: 11 g/dL — ABNORMAL LOW (ref 12.0–15.0)
MCH: 29.7 pg (ref 26.0–34.0)
MCHC: 31.7 g/dL (ref 30.0–36.0)
MCV: 93.8 fL (ref 78.0–100.0)
PLATELETS: 229 10*3/uL (ref 150–400)
RBC: 3.7 MIL/uL — ABNORMAL LOW (ref 3.87–5.11)
RDW: 13.1 % (ref 11.5–15.5)
WBC: 5.9 10*3/uL (ref 4.0–10.5)

## 2017-10-28 LAB — BASIC METABOLIC PANEL
Anion gap: 7 (ref 5–15)
BUN: 17 mg/dL (ref 6–20)
CALCIUM: 9 mg/dL (ref 8.9–10.3)
CO2: 29 mmol/L (ref 22–32)
CREATININE: 0.91 mg/dL (ref 0.44–1.00)
Chloride: 102 mmol/L (ref 101–111)
Glucose, Bld: 99 mg/dL (ref 65–99)
Potassium: 3.8 mmol/L (ref 3.5–5.1)
Sodium: 138 mmol/L (ref 135–145)

## 2017-10-28 MED ORDER — KETOROLAC TROMETHAMINE 10 MG PO TABS: 10.0000 mg | ORAL_TABLET | Freq: Three times a day (TID) | ORAL | 0 refills | Status: DC | PRN

## 2017-10-28 MED ORDER — ONDANSETRON HCL 8 MG PO TABS: 8.0000 mg | ORAL_TABLET | Freq: Four times a day (QID) | ORAL | 0 refills | Status: DC | PRN

## 2017-10-28 MED ORDER — OXYCODONE-ACETAMINOPHEN 5-325 MG PO TABS
1.0000 | ORAL_TABLET | ORAL | Status: DC | PRN
  Administered 2017-10-28: 2 via ORAL
  Filled 2017-10-28: qty 2

## 2017-10-28 MED ORDER — OXYCODONE-ACETAMINOPHEN 5-325 MG PO TABS: 1.0000 | ORAL_TABLET | ORAL | 0 refills | Status: DC | PRN

## 2017-10-28 NOTE — Progress Notes (Signed)
Discharged instructions and RX reviewed with pt verb understanding. Iv discontinued and bleeding controlled. Pt leaving in wheelchair NAD with all belongings with [facility] as her ride home.

## 2017-10-28 NOTE — Anesthesia Postprocedure Evaluation (Signed)
Anesthesia Post Note  Patient: Yesenia Montgomery  Procedure(s) Performed: TOTAL ABDOMINAL HYSTERECTOMY (N/A Abdomen) BILATERAL SALPINGO OOPHORECTOMY (Bilateral Abdomen)  Patient location during evaluation: Nursing Unit Anesthesia Type: General Level of consciousness: awake and alert Pain management: pain level controlled Vital Signs Assessment: post-procedure vital signs reviewed and stable Respiratory status: spontaneous breathing, nonlabored ventilation and respiratory function stable Cardiovascular status: blood pressure returned to baseline Postop Assessment: no apparent nausea or vomiting Anesthetic complications: no     Last Vitals:  Vitals:   10/28/17 0500 10/28/17 0900  BP: 119/64 (!) 109/53  Pulse: 64 82  Resp: 16 16  Temp: 36.9 C 36.7 C  SpO2: 97% 98%    Last Pain:  Vitals:   10/28/17 1000  TempSrc:   PainSc: 5                  Jahkai Yandell J

## 2017-10-28 NOTE — Discharge Instructions (Signed)
Abdominal Hysterectomy, Care After °This sheet gives you information about how to care for yourself after your procedure. Your health care provider may also give you more specific instructions. If you have problems or questions, contact your health care provider. °What can I expect after the procedure? °After your procedure, it is common to have: °· Pain. °· Fatigue. °· Poor appetite. °· Less interest in sex. °· Vaginal bleeding and discharge. You may need to use a sanitary napkin after this procedure. ° °Follow these instructions at home: °Bathing °· Do not take baths, swim, or use a hot tub until your health care provider approves. Ask your health care provider if you can take showers. You may only be allowed to take sponge baths for bathing. °· Keep the bandage (dressing) dry until your health care provider says it can be removed. °Incision care °· Follow instructions from your health care provider about how to take care of your incision. Make sure you: °? Wash your hands with soap and water before you change your bandage (dressing). If soap and water are not available, use hand sanitizer. °? Change your dressing as told by your health care provider. °? Leave stitches (sutures), skin glue, or adhesive strips in place. These skin closures may need to stay in place for 2 weeks or longer. If adhesive strip edges start to loosen and curl up, you may trim the loose edges. Do not remove adhesive strips completely unless your health care provider tells you to do that. °· Check your incision area every day for signs of infection. Check for: °? Redness, swelling, or pain. °? Fluid or blood. °? Warmth. °? Pus or a bad smell. °Activity °· Do gentle, daily exercises as told by your health care provider. You may be told to take short walks every day and go farther each time. °· Do not lift anything that is heavier than 10 lb (4.5 kg), or the limit that your health care provider tells you, until he or she says that it is  safe. °· Do not drive or use heavy machinery while taking prescription pain medicine. °· Do not drive for 24 hours if you were given a medicine to help you relax (sedative). °· Follow your health care provider's instructions about exercise, driving, and general activities. Ask your health care provider what activities are safe for you. °Lifestyle °· Do not douche, use tampons, or have sex for at least 6 weeks or as told by your health care provider. °· Do not drink alcohol until your health care provider approves. °· Drink enough fluid to keep your urine clear or pale yellow. °· Try to have someone at home with you for the first 1-2 weeks to help. °· Do not use any products that contain nicotine or tobacco, such as cigarettes and e-cigarettes. These can delay healing. If you need help quitting, ask your health care provider. °General instructions °· Take over-the-counter and prescription medicines only as told by your health care provider. °· Do not take aspirin or ibuprofen. These medicines can cause bleeding. °· To prevent or treat constipation while you are taking prescription pain medicine, your health care provider may recommend that you: °? Drink enough fluid to keep your urine clear or pale yellow. °? Take over-the-counter or prescription medicines. °? Eat foods that are high in fiber, such as fresh fruits and vegetables, whole grains, and beans. °? Limit foods that are high in fat and processed sugars, such as fried and sweet foods. °· Keep all   follow-up visits as told by your health care provider. This is important. °Contact a health care provider if: °· You have chills or fever. °· You have redness, swelling, or pain around your incision. °· You have fluid or blood coming from your incision. °· Your incision feels warm to the touch. °· You have pus or a bad smell coming from your incision. °· Your incision breaks open. °· You feel dizzy or light-headed. °· You have pain or bleeding when you urinate. °· You  have persistent diarrhea. °· You have persistent nausea and vomiting. °· You have abnormal vaginal discharge. °· You have a rash. °· You have any type of abnormal reaction or you develop an allergy to your medicine. °· Your pain medicine does not help. °Get help right away if: °· You have a fever and your symptoms suddenly get worse. °· You have severe abdominal pain. °· You have shortness of breath. °· You faint. °· You have pain, swelling, or redness in your leg. °· You have heavy vaginal bleeding with blood clots. °Summary °· After your procedure, it is common to have pain, fatigue and vaginal discharge. °· Do not take baths, swim, or use a hot tub until your health care provider approves. Ask your health care provider if you can take showers. You may only be allowed to take sponge baths for bathing. °· Follow your health care provider's instructions about exercise, driving, and general activities. Ask your health care provider what activities are safe for you. °· Do not lift anything that is heavier than 10 lb (4.5 kg), or the limit that your health care provider tells you, until he or she says that it is safe. °· Try to have someone at home with you for the first 1-2 weeks to help. °This information is not intended to replace advice given to you by your health care provider. Make sure you discuss any questions you have with your health care provider. °Document Released: 03/06/2005 Document Revised: 08/05/2016 Document Reviewed: 08/05/2016 °Elsevier Interactive Patient Education © 2017 [employer] ° °

## 2017-10-28 NOTE — Progress Notes (Signed)
Pt walked to the nurse station and back with very little pain. No PRN pain meds given.

## 2017-10-28 NOTE — Discharge Summary (Signed)
Physician Discharge Summary  Patient ID: Yesenia Montgomery MRN: [id number] DOB/AGE: 1955/12/21 62 y.o.  Admit date: 10/27/2017 Discharge date: 10/28/2017  Admission Diagnoses: CAH of endometrium Fibroid uterus Discharge Diagnoses:  Active Problems:   S/P TAH-BSO (total abdominal hysterectomy and bilateral salpingo-oophorectomy)   Discharged Condition: good  Hospital Course: unremarkable post op course  Consults: None  Significant Diagnostic Studies: labs:   Treatments: surgery: TAH BSO  Discharge Exam: Blood pressure (!) 109/53, pulse 82, temperature 98.1 F (36.7 C), temperature source Oral, resp. rate 16, weight 238 lb 1.6 oz (108 kg), SpO2 98 %. General appearance: alert, cooperative and no distress GI: soft, non-tender; bowel sounds normal; no masses,  no organomegaly Incision/Wound:clean dry intact  Disposition: 01-Home or Self Care  Discharge Instructions    Call MD for:  persistant nausea and vomiting   Complete by:  As directed    Call MD for:  severe uncontrolled pain   Complete by:  As directed    Call MD for:  temperature >100.4   Complete by:  As directed    Diet - low sodium heart healthy   Complete by:  As directed    Driving Restrictions   Complete by:  As directed    Do not drive for 1 week   Increase activity slowly   Complete by:  As directed    Leave dressing on - Keep it clean, dry, and intact until clinic visit   Complete by:  As directed    Lifting restrictions   Complete by:  As directed    Do not lift more than 10 pounds   Sexual Activity Restrictions   Complete by:  As directed    No sex for 6 weeks     Allergies as of 10/28/2017      Reactions   Latex Rash      Medication List    TAKE these medications   atenolol-chlorthalidone 50-25 MG tablet Commonly known as:  TENORETIC TAKE 1 TABLET BY MOUTH  DAILY   BIOFLEX PO Take 2 tablets by mouth daily.   Cinnamon 500 MG capsule Take 1,000 mg by mouth daily.   diclofenac 50 MG EC  tablet Commonly known as:  VOLTAREN Take 50 mg by mouth 2 (two) times daily as needed for mild pain.   diclofenac sodium 1 % Gel Commonly known as:  VOLTAREN Apply 1 application topically 2 (two) times daily as needed (for pain).   Ginkgo 60 MG Tabs Take 60 mg by mouth daily.   ketorolac 10 MG tablet Commonly known as:  TORADOL Take 1 tablet (10 mg total) by mouth every 8 (eight) hours as needed.   ondansetron 8 MG tablet Commonly known as:  ZOFRAN Take 1 tablet (8 mg total) by mouth every 6 (six) hours as needed for nausea.   oxyCODONE-acetaminophen 5-325 MG tablet Commonly known as:  PERCOCET/ROXICET Take 1-2 tablets by mouth every 4 (four) hours as needed for moderate pain.   Potassium 99 MG Tabs Take 99 mg by mouth daily.   trolamine salicylate 10 % cream Commonly known as:  ASPERCREME Apply 1 application topically as needed for muscle pain.   Turmeric 500 MG Caps Take 1,000 mg by mouth daily.   VITAMIN A PO Take 2,400 Units by mouth daily.   Vitamin D 2000 units Caps Take 2,000 Units by mouth daily.   vitamin E 400 UNIT capsule Take 400 Units by mouth daily.      Follow-up Information    Yuriel Lopezmartinez,  Mertie Clause, MD Follow up on 11/04/2017.   Specialties:  Obstetrics and Gynecology, Radiology Why:  post op visit Contact information: [place] [address] [phone]           Signed: Florian Buff 10/28/2017, 1:09 PM

## 2017-10-28 NOTE — Addendum Note (Signed)
Addendum  created 10/28/17 1119 by Charmaine Downs, CRNA   Sign clinical note

## 2017-11-04 ENCOUNTER — Ambulatory Visit (INDEPENDENT_AMBULATORY_CARE_PROVIDER_SITE_OTHER): Payer: Medicare Other | Admitting: Obstetrics & Gynecology

## 2017-11-04 ENCOUNTER — Encounter: Payer: Self-pay | Admitting: Obstetrics & Gynecology

## 2017-11-04 VITALS — BP 150/82 | HR 70 | Ht 69.0 in | Wt 236.0 lb

## 2017-11-04 DIAGNOSIS — Z9889 Other specified postprocedural states: Secondary | ICD-10-CM

## 2017-11-04 DIAGNOSIS — Z90722 Acquired absence of ovaries, bilateral: Secondary | ICD-10-CM

## 2017-11-04 DIAGNOSIS — Z9079 Acquired absence of other genital organ(s): Secondary | ICD-10-CM

## 2017-11-04 DIAGNOSIS — Z90710 Acquired absence of both cervix and uterus: Secondary | ICD-10-CM

## 2017-11-04 NOTE — Progress Notes (Signed)
  HPI: Patient returns for routine postoperative follow-up having undergone TAH BSO on 10/27/2017.  The patient's immediate postoperative recovery has been unremarkable. Since hospital discharge the patient reports soreness.   Current Outpatient Medications: atenolol-chlorthalidone (TENORETIC) 50-25 MG tablet, TAKE 1 TABLET BY MOUTH  DAILY, Disp: 90 tablet, Rfl: 0 Bioflavonoid Products (BIOFLEX PO), Take 2 tablets by mouth daily. , Disp: , Rfl:  Cholecalciferol (VITAMIN D) 2000 units CAPS, Take 2,000 Units by mouth daily., Disp: , Rfl:  Cinnamon 500 MG capsule, Take 1,000 mg by mouth daily., Disp: , Rfl:  Ginkgo 60 MG TABS, Take 60 mg by mouth daily. , Disp: , Rfl:  ketorolac (TORADOL) 10 MG tablet, Take 1 tablet (10 mg total) by mouth every 8 (eight) hours as needed., Disp: 15 tablet, Rfl: 0 oxyCODONE-acetaminophen (PERCOCET/ROXICET) 5-325 MG tablet, Take 1-2 tablets by mouth every 4 (four) hours as needed for moderate pain., Disp: 40 tablet, Rfl: 0 Potassium 99 MG TABS, Take 99 mg by mouth daily. , Disp: , Rfl:  Turmeric 500 MG CAPS, Take 1,000 mg by mouth daily. , Disp: , Rfl:  VITAMIN A PO, Take 2,400 Units by mouth daily., Disp: , Rfl:  vitamin E 400 UNIT capsule, Take 400 Units by mouth daily., Disp: , Rfl:  diclofenac (VOLTAREN) 50 MG EC tablet, Take 50 mg by mouth 2 (two) times daily as needed for mild pain., Disp: , Rfl:  diclofenac sodium (VOLTAREN) 1 % GEL, Apply 1 application topically 2 (two) times daily as needed (for pain)., Disp: , Rfl:  ondansetron (ZOFRAN) 8 MG tablet, Take 1 tablet (8 mg total) by mouth every 6 (six) hours as needed for nausea. (Patient not taking: Reported on 11/04/2017), Disp: 20 tablet, Rfl: 0 trolamine salicylate (ASPERCREME) 10 % cream, Apply 1 application topically as needed for muscle pain., Disp: , Rfl:   No current facility-administered medications for this visit.     Blood pressure (!) 150/82, pulse 70, height 5\' 9"  (1.753 m), weight 236 lb (107  kg).  Physical Exam: Incision clean dry intact abdomne soft non tender  Diagnostic Tests:   Pathology: CAH, no carcinoma  Impression: S/p TAH BSO, normal post op recovery  Plan:   Follow up: 4  weeks  Florian Buff, MD

## 2017-11-08 ENCOUNTER — Encounter: Payer: Self-pay | Admitting: Family Medicine

## 2017-12-02 ENCOUNTER — Ambulatory Visit (INDEPENDENT_AMBULATORY_CARE_PROVIDER_SITE_OTHER): Payer: Medicare Other | Admitting: Obstetrics & Gynecology

## 2017-12-02 VITALS — BP 130/76 | HR 62 | Ht 69.0 in | Wt 236.0 lb

## 2017-12-02 DIAGNOSIS — Z9889 Other specified postprocedural states: Secondary | ICD-10-CM

## 2017-12-02 DIAGNOSIS — Z90710 Acquired absence of both cervix and uterus: Secondary | ICD-10-CM

## 2017-12-02 DIAGNOSIS — Z9079 Acquired absence of other genital organ(s): Principal | ICD-10-CM

## 2017-12-02 DIAGNOSIS — Z90722 Acquired absence of ovaries, bilateral: Principal | ICD-10-CM

## 2017-12-02 NOTE — Progress Notes (Signed)
  HPI: Patient returns for routine postoperative follow-up having undergone total abdominal hysterectomy and bilateral C salpingo-oophorectomy on October 27, 2017 for 18 weeks size fibroid uterus and complex atypical endometrial hyperplasia.  The patient's immediate postoperative recovery has been unremarkable. Since hospital discharge the patient reports unremarkable postoperative course.   Current Outpatient Medications: atenolol-chlorthalidone (TENORETIC) 50-25 MG tablet, TAKE 1 TABLET BY MOUTH  DAILY, Disp: 90 tablet, Rfl: 0 Bioflavonoid Products (BIOFLEX PO), Take 2 tablets by mouth daily. , Disp: , Rfl:  Cholecalciferol (VITAMIN D) 2000 units CAPS, Take 2,000 Units by mouth daily., Disp: , Rfl:  Cinnamon 500 MG capsule, Take 1,000 mg by mouth daily., Disp: , Rfl:  Ginkgo 60 MG TABS, Take 60 mg by mouth daily. , Disp: , Rfl:  ketorolac (TORADOL) 10 MG tablet, Take 1 tablet (10 mg total) by mouth every 8 (eight) hours as needed., Disp: 15 tablet, Rfl: 0 ondansetron (ZOFRAN) 8 MG tablet, Take 1 tablet (8 mg total) by mouth every 6 (six) hours as needed for nausea., Disp: 20 tablet, Rfl: 0 oxyCODONE-acetaminophen (PERCOCET/ROXICET) 5-325 MG tablet, Take 1-2 tablets by mouth every 4 (four) hours as needed for moderate pain., Disp: 40 tablet, Rfl: 0 Potassium 99 MG TABS, Take 99 mg by mouth daily. , Disp: , Rfl:  Turmeric 500 MG CAPS, Take 1,000 mg by mouth daily. , Disp: , Rfl:  VITAMIN A PO, Take 2,400 Units by mouth daily., Disp: , Rfl:  vitamin E 400 UNIT capsule, Take 400 Units by mouth daily., Disp: , Rfl:  diclofenac (VOLTAREN) 50 MG EC tablet, Take 50 mg by mouth 2 (two) times daily as needed for mild pain., Disp: , Rfl:  diclofenac sodium (VOLTAREN) 1 % GEL, Apply 1 application topically 2 (two) times daily as needed (for pain)., Disp: , Rfl:  trolamine salicylate (ASPERCREME) 10 % cream, Apply 1 application topically as needed for muscle pain., Disp: , Rfl:   No current  facility-administered medications for this visit.     Blood pressure 130/76, pulse 62, height 5\' 9"  (1.753 m), weight 236 lb (107 kg).   Physical Exam: Incision is clean dry and intact Abdominal exam is benign Cuff is well-healed  Diagnostic Tests:   Pathology: Fibroids and complex atypical hyperplasia no malignancy  Impression: Status post TAH/BSO with unremarkable postoperative course  Plan: May begin having intercourse  Follow up: Return in about 1 year (around 12/03/2018) for [place], with Dr Elonda Husky.   Florian Buff, MD

## 2018-01-05 ENCOUNTER — Other Ambulatory Visit: Payer: Self-pay | Admitting: Family Medicine

## 2018-03-02 DIAGNOSIS — M25572 Pain in left ankle and joints of left foot: Secondary | ICD-10-CM | POA: Diagnosis not present

## 2018-03-02 DIAGNOSIS — I10 Essential (primary) hypertension: Secondary | ICD-10-CM | POA: Diagnosis not present

## 2018-03-04 ENCOUNTER — Other Ambulatory Visit: Payer: Self-pay | Admitting: Internal Medicine

## 2018-03-04 DIAGNOSIS — Z780 Asymptomatic menopausal state: Secondary | ICD-10-CM

## 2018-03-21 ENCOUNTER — Other Ambulatory Visit: Payer: Self-pay | Admitting: Internal Medicine

## 2018-03-21 DIAGNOSIS — Z1231 Encounter for screening mammogram for malignant neoplasm of breast: Secondary | ICD-10-CM

## 2018-04-01 DIAGNOSIS — M1A00X0 Idiopathic chronic gout, unspecified site, without tophus (tophi): Secondary | ICD-10-CM | POA: Diagnosis not present

## 2018-04-01 DIAGNOSIS — I10 Essential (primary) hypertension: Secondary | ICD-10-CM | POA: Diagnosis not present

## 2018-04-04 ENCOUNTER — Other Ambulatory Visit: Payer: Self-pay | Admitting: Internal Medicine

## 2018-04-04 DIAGNOSIS — Z87898 Personal history of other specified conditions: Secondary | ICD-10-CM

## 2018-04-08 DIAGNOSIS — Z0000 Encounter for general adult medical examination without abnormal findings: Secondary | ICD-10-CM | POA: Diagnosis not present

## 2018-04-08 DIAGNOSIS — I10 Essential (primary) hypertension: Secondary | ICD-10-CM | POA: Diagnosis not present

## 2018-04-08 DIAGNOSIS — R7301 Impaired fasting glucose: Secondary | ICD-10-CM | POA: Diagnosis not present

## 2018-04-08 DIAGNOSIS — M109 Gout, unspecified: Secondary | ICD-10-CM | POA: Diagnosis not present

## 2018-04-08 DIAGNOSIS — E782 Mixed hyperlipidemia: Secondary | ICD-10-CM | POA: Diagnosis not present

## 2018-05-17 ENCOUNTER — Ambulatory Visit (HOSPITAL_COMMUNITY)
Admission: RE | Admit: 2018-05-17 | Discharge: 2018-05-17 | Disposition: A | Discharge status: Home / Self Care | Payer: Medicare Other | Source: Ambulatory Visit | Attending: Internal Medicine | Admitting: Internal Medicine

## 2018-05-17 ENCOUNTER — Encounter (HOSPITAL_COMMUNITY): Payer: Medicare Other

## 2018-05-17 DIAGNOSIS — R928 Other abnormal and inconclusive findings on diagnostic imaging of breast: Secondary | ICD-10-CM | POA: Insufficient documentation

## 2018-05-17 DIAGNOSIS — Z87898 Personal history of other specified conditions: Secondary | ICD-10-CM

## 2018-05-24 ENCOUNTER — Ambulatory Visit (HOSPITAL_COMMUNITY)
Admission: RE | Admit: 2018-05-24 | Discharge: 2018-05-24 | Disposition: A | Discharge status: Home / Self Care | Payer: Medicare Other | Source: Ambulatory Visit | Attending: Internal Medicine | Admitting: Internal Medicine

## 2018-05-24 DIAGNOSIS — Z780 Asymptomatic menopausal state: Secondary | ICD-10-CM | POA: Insufficient documentation

## 2019-03-24 DIAGNOSIS — Z0000 Encounter for general adult medical examination without abnormal findings: Secondary | ICD-10-CM | POA: Diagnosis not present

## 2019-04-25 DIAGNOSIS — E782 Mixed hyperlipidemia: Secondary | ICD-10-CM | POA: Diagnosis not present

## 2019-04-25 DIAGNOSIS — R7301 Impaired fasting glucose: Secondary | ICD-10-CM | POA: Diagnosis not present

## 2019-04-25 DIAGNOSIS — I10 Essential (primary) hypertension: Secondary | ICD-10-CM | POA: Diagnosis not present

## 2019-04-28 ENCOUNTER — Other Ambulatory Visit (HOSPITAL_COMMUNITY): Payer: Self-pay | Admitting: Internal Medicine

## 2019-04-28 DIAGNOSIS — E782 Mixed hyperlipidemia: Secondary | ICD-10-CM | POA: Diagnosis not present

## 2019-04-28 DIAGNOSIS — R7301 Impaired fasting glucose: Secondary | ICD-10-CM | POA: Diagnosis not present

## 2019-04-28 DIAGNOSIS — I10 Essential (primary) hypertension: Secondary | ICD-10-CM | POA: Diagnosis not present

## 2019-04-28 DIAGNOSIS — Z1231 Encounter for screening mammogram for malignant neoplasm of breast: Secondary | ICD-10-CM

## 2019-04-28 DIAGNOSIS — L259 Unspecified contact dermatitis, unspecified cause: Secondary | ICD-10-CM | POA: Diagnosis not present

## 2019-04-28 DIAGNOSIS — R945 Abnormal results of liver function studies: Secondary | ICD-10-CM | POA: Diagnosis not present

## 2019-04-28 DIAGNOSIS — Z0001 Encounter for general adult medical examination with abnormal findings: Secondary | ICD-10-CM | POA: Diagnosis not present

## 2019-05-09 ENCOUNTER — Ambulatory Visit (INDEPENDENT_AMBULATORY_CARE_PROVIDER_SITE_OTHER): Payer: Medicare Other | Admitting: Obstetrics & Gynecology

## 2019-05-09 ENCOUNTER — Encounter: Payer: Self-pay | Admitting: Obstetrics & Gynecology

## 2019-05-09 ENCOUNTER — Other Ambulatory Visit: Payer: Self-pay

## 2019-05-09 VITALS — BP 135/79 | HR 72 | Ht 68.0 in | Wt 251.4 lb

## 2019-05-09 DIAGNOSIS — Z01419 Encounter for gynecological examination (general) (routine) without abnormal findings: Secondary | ICD-10-CM

## 2019-05-09 DIAGNOSIS — Z1212 Encounter for screening for malignant neoplasm of rectum: Secondary | ICD-10-CM | POA: Diagnosis not present

## 2019-05-09 DIAGNOSIS — Z1211 Encounter for screening for malignant neoplasm of colon: Secondary | ICD-10-CM | POA: Diagnosis not present

## 2019-05-09 NOTE — Progress Notes (Signed)
Patient ID: Yesenia Montgomery, female   DOB: 1956/03/17, 63 y.o.   MRN: [id number] Subjective:     Yesenia Montgomery is a 63 y.o. female here for a routine exam.  No LMP recorded. Patient has had a hysterectomy. [id number] Birth Control Method:  hysterectomy Menstrual Calendar(currently): hysterectomy  Current complaints: none.   Current acute medical issues:     Recent Gynecologic History No LMP recorded. Patient has had a hysterectomy. Last Pap: 2019,  normal Last mammogram: 2019,  normal  Past Medical History:  Diagnosis Date  . Arthritis    knees  . Chronic pain   . HLD (hyperlipidemia) 03/30/2017  . Hypertension     Past Surgical History:  Procedure Laterality Date  . ABDOMINAL HYSTERECTOMY N/A 10/27/2017   Procedure: TOTAL ABDOMINAL HYSTERECTOMY;  Surgeon: Florian Buff, MD;  Location: [facility];  Service: Gynecology;  Laterality: N/A;  . SALPINGOOPHORECTOMY Bilateral 10/27/2017   Procedure: BILATERAL SALPINGO OOPHORECTOMY;  Surgeon: Florian Buff, MD;  Location: [facility];  Service: Gynecology;  Laterality: Bilateral;  . TONSILLECTOMY      OB History    Gravida  3   Para  2   Term  2   Preterm      AB  1   Living  2     SAB      TAB      Ectopic      Multiple      Live Births              Social History   Socioeconomic History  . Marital status: Widowed    Spouse name: Not on file  . Number of children: 2  . Years of education: 18  . Highest education level: Not on file  Occupational History  . Occupation: retired/disabled    Comment: [employer]  Social Needs  . Financial resource strain: Not on file  . Food insecurity    Worry: Not on file    Inability: Not on file  . Transportation needs    Medical: Not on file    Non-medical: Not on file  Tobacco Use  . Smoking status: Former Smoker    Packs/day: 0.25    Years: 5.00    Pack years: 1.25    Types: Cigarettes    Quit date: 10/23/1983    Years since quitting: 35.5  .  Smokeless tobacco: Never Used  Substance and Sexual Activity  . Alcohol use: No  . Drug use: No  . Sexual activity: Not Currently    Birth control/protection: Post-menopausal    Comment: widow  Lifestyle  . Physical activity    Days per week: Not on file    Minutes per session: Not on file  . Stress: Not on file  Relationships  . Social Herbalist on phone: Not on file    Gets together: Not on file    Attends religious service: Not on file    Active member of club or organization: Not on file    Attends meetings of clubs or organizations: Not on file    Relationship status: Not on file  Other Topics Concern  . Not on file  Social History Narrative   Lives alone   Children in [place]   Has lived in [place] since 2009 - husband was a Set designer to [employer], swims   reads at [employer]    Family History  Problem Relation Age of  Onset  . Stroke Mother   . Heart disease Mother   . Hypertension Mother   . Hyperlipidemia Mother   . Early death Father        bedridden by age of 87 ?  Marland Kitchen Alcohol abuse Father   . Cancer Paternal Aunt   . Diabetes Cousin      Current Outpatient Medications:  .  atenolol-chlorthalidone (TENORETIC) 50-25 MG tablet, TAKE 1 TABLET BY MOUTH  DAILY, Disp: 90 tablet, Rfl: 0 .  Bioflavonoid Products (BIOFLEX PO), Take 2 tablets by mouth daily. , Disp: , Rfl:  .  Cholecalciferol (VITAMIN D) 2000 units CAPS, Take 2,000 Units by mouth daily., Disp: , Rfl:  .  Potassium 99 MG TABS, Take 99 mg by mouth daily. , Disp: , Rfl:  .  Turmeric 500 MG CAPS, Take 1,000 mg by mouth daily. , Disp: , Rfl:  .  VITAMIN A PO, Take 2,400 Units by mouth daily., Disp: , Rfl:  .  Cinnamon 500 MG capsule, Take 1,000 mg by mouth daily., Disp: , Rfl:  .  diclofenac (VOLTAREN) 50 MG EC tablet, Take 50 mg by mouth 2 (two) times daily as needed for mild pain., Disp: , Rfl:  .  diclofenac sodium (VOLTAREN) 1 % GEL, Apply 1 application topically 2 (two) times daily as needed (for  pain)., Disp: , Rfl:  .  Ginkgo 60 MG TABS, Take 60 mg by mouth daily. , Disp: , Rfl:  .  ketorolac (TORADOL) 10 MG tablet, Take 1 tablet (10 mg total) by mouth every 8 (eight) hours as needed. (Patient not taking: Reported on 05/09/2019), Disp: 15 tablet, Rfl: 0 .  ondansetron (ZOFRAN) 8 MG tablet, Take 1 tablet (8 mg total) by mouth every 6 (six) hours as needed for nausea. (Patient not taking: Reported on 05/09/2019), Disp: 20 tablet, Rfl: 0 .  oxyCODONE-acetaminophen (PERCOCET/ROXICET) 5-325 MG tablet, Take 1-2 tablets by mouth every 4 (four) hours as needed for moderate pain. (Patient not taking: Reported on 05/09/2019), Disp: 40 tablet, Rfl: 0 .  trolamine salicylate (ASPERCREME) 10 % cream, Apply 1 application topically as needed for muscle pain., Disp: , Rfl:  .  vitamin E 400 UNIT capsule, Take 400 Units by mouth daily., Disp: , Rfl:   Review of Systems  Review of Systems  Constitutional: Negative for fever, chills, weight loss, malaise/fatigue and diaphoresis.  HENT: Negative for hearing loss, ear pain, nosebleeds, congestion, sore throat, neck pain, tinnitus and ear discharge.   Eyes: Negative for blurred vision, double vision, photophobia, pain, discharge and redness.  Respiratory: Negative for cough, hemoptysis, sputum production, shortness of breath, wheezing and stridor.   Cardiovascular: Negative for chest pain, palpitations, orthopnea, claudication, leg swelling and PND.  Gastrointestinal: negative for abdominal pain. Negative for heartburn, nausea, vomiting, diarrhea, constipation, blood in stool and melena.  Genitourinary: Negative for dysuria, urgency, frequency, hematuria and flank pain.  Musculoskeletal: Negative for myalgias, back pain, joint pain and falls.  Skin: Negative for itching and rash.  Neurological: Negative for dizziness, tingling, tremors, sensory change, speech change, focal weakness, seizures, loss of consciousness, weakness and headaches.  Endo/Heme/Allergies:  Negative for environmental allergies and polydipsia. Does not bruise/bleed easily.  Psychiatric/Behavioral: Negative for depression, suicidal ideas, hallucinations, memory loss and substance abuse. The patient is not nervous/anxious and does not have insomnia.        Objective:  Blood pressure 135/79, pulse 72, height 5\' 8"  (1.727 m), weight 251 lb 6.4 oz (114 kg).   Physical Exam  Vitals reviewed. Constitutional: She is oriented to person, place, and time. She appears well-developed and well-nourished.  HENT:  Head: Normocephalic and atraumatic.        Right Ear: External ear normal.  Left Ear: External ear normal.  Nose: Nose normal.  Mouth/Throat: Oropharynx is clear and moist.  Eyes: Conjunctivae and EOM are normal. Pupils are equal, round, and reactive to light. Right eye exhibits no discharge. Left eye exhibits no discharge. No scleral icterus.  Neck: Normal range of motion. Neck supple. No tracheal deviation present. No thyromegaly present.  Cardiovascular: Normal rate, regular rhythm, normal heart sounds and intact distal pulses.  Exam reveals no gallop and no friction rub.   No murmur heard. Respiratory: Effort normal and breath sounds normal. No respiratory distress. She has no wheezes. She has no rales. She exhibits no tenderness.  GI: Soft. Bowel sounds are normal. She exhibits no distension and no mass. There is no tenderness. There is no rebound and no guarding.  Genitourinary:  Breasts no masses skin changes or nipple changes bilaterally      Vulva is normal without lesions Vagina is pink moist without discharge Cervix normal in appearance and pap is done Uterus is normal size shape and contour Adnexa is negative with normal sized ovaries  {Rectal    hemoccult negative, normal tone, no masses  Musculoskeletal: Normal range of motion. She exhibits no edema and no tenderness.  Neurological: She is alert and oriented to person, place, and time. She has normal reflexes. She  displays normal reflexes. No cranial nerve deficit. She exhibits normal muscle tone. Coordination normal.  Skin: Skin is warm and dry. No rash noted. No erythema. No pallor.  Psychiatric: She has a normal mood and affect. Her behavior is normal. Judgment and thought content normal.       Medications Ordered at today's visit: No orders of the defined types were placed in this encounter.   Other orders placed at today's visit: No orders of the defined types were placed in this encounter.     Assessment:    Healthy female exam.   s/p hysterectomy for 18 weeks size fibroid and CAH Plan:    Mammogram ordered. Follow up in: 3 years.     No follow-ups on file.

## 2019-05-17 ENCOUNTER — Ambulatory Visit (HOSPITAL_COMMUNITY): Payer: Medicare Other

## 2019-05-24 ENCOUNTER — Ambulatory Visit (HOSPITAL_COMMUNITY)
Admission: RE | Admit: 2019-05-24 | Discharge: 2019-05-24 | Disposition: A | Discharge status: Home / Self Care | Payer: Medicare Other | Source: Ambulatory Visit | Attending: Internal Medicine | Admitting: Internal Medicine

## 2019-05-24 ENCOUNTER — Encounter (HOSPITAL_COMMUNITY): Payer: Self-pay

## 2019-05-24 ENCOUNTER — Other Ambulatory Visit: Payer: Self-pay

## 2019-05-24 DIAGNOSIS — Z1231 Encounter for screening mammogram for malignant neoplasm of breast: Secondary | ICD-10-CM | POA: Insufficient documentation

## 2019-07-04 DIAGNOSIS — J309 Allergic rhinitis, unspecified: Secondary | ICD-10-CM | POA: Diagnosis not present

## 2019-10-05 DIAGNOSIS — R05 Cough: Secondary | ICD-10-CM | POA: Diagnosis not present

## 2019-10-05 DIAGNOSIS — I10 Essential (primary) hypertension: Secondary | ICD-10-CM | POA: Diagnosis not present

## 2019-10-05 DIAGNOSIS — L209 Atopic dermatitis, unspecified: Secondary | ICD-10-CM | POA: Diagnosis not present

## 2019-10-05 DIAGNOSIS — J309 Allergic rhinitis, unspecified: Secondary | ICD-10-CM | POA: Diagnosis not present

## 2019-11-02 ENCOUNTER — Other Ambulatory Visit: Payer: Self-pay

## 2019-11-02 ENCOUNTER — Ambulatory Visit (HOSPITAL_COMMUNITY)
Admission: RE | Admit: 2019-11-02 | Discharge: 2019-11-02 | Disposition: A | Discharge status: Home / Self Care | Payer: Medicare Other | Source: Ambulatory Visit | Attending: Internal Medicine | Admitting: Internal Medicine

## 2019-11-02 ENCOUNTER — Other Ambulatory Visit (HOSPITAL_COMMUNITY): Payer: Self-pay | Admitting: Internal Medicine

## 2019-11-02 DIAGNOSIS — M25511 Pain in right shoulder: Secondary | ICD-10-CM

## 2019-11-02 DIAGNOSIS — I10 Essential (primary) hypertension: Secondary | ICD-10-CM | POA: Diagnosis not present

## 2019-11-09 ENCOUNTER — Other Ambulatory Visit: Payer: Self-pay

## 2019-11-09 ENCOUNTER — Ambulatory Visit (INDEPENDENT_AMBULATORY_CARE_PROVIDER_SITE_OTHER): Payer: Medicare Other | Admitting: Orthopaedic Surgery

## 2019-11-09 ENCOUNTER — Encounter: Payer: Self-pay | Admitting: Orthopaedic Surgery

## 2019-11-09 VITALS — Temp 97.5°F | Ht 69.0 in | Wt 248.0 lb

## 2019-11-09 DIAGNOSIS — M25511 Pain in right shoulder: Secondary | ICD-10-CM | POA: Diagnosis not present

## 2019-11-09 DIAGNOSIS — W19XXXA Unspecified fall, initial encounter: Secondary | ICD-10-CM | POA: Diagnosis not present

## 2019-11-09 NOTE — Progress Notes (Signed)
Subjective:    Patient ID: Yesenia Montgomery, female    DOB: Sep 25, 1955, 64 y.o.   MRN: [id number]  HPI She fell on 11-01-2019 and hurt her right shoulder.  She had pain in movement.  She was seen by Dr. Wende Neighbors on 11-03-2019 and had x-rays done which were negative.  I have reviewed Dr. Juel Burrow notes.    I have independently reviewed and interpreted x-rays of this patient done at another site by another physician or qualified health professional.  She has some less pain but the right shoulder still hurts.  She is using a sling.  She has more pain when sleeping.  She has used ice and rubs.  Dr. Nevada Crane gave her Relafen 750 which helps also.    Review of Systems  Constitutional: Positive for activity change.  Musculoskeletal: Positive for arthralgias.  All other systems reviewed and are negative.  For Review of Systems, all other systems reviewed and are negative.  The following is a summary of the past history medically, past history surgically, known current medicines, social history and family history.  This information is gathered electronically by the computer from prior information and documentation.  I review this each visit and have found including this information at this point in the chart is beneficial and informative.   Past Medical History:  Diagnosis Date  . Arthritis    knees  . Chronic pain   . HLD (hyperlipidemia) 03/30/2017  . Hypertension     Past Surgical History:  Procedure Laterality Date  . ABDOMINAL HYSTERECTOMY N/A 10/27/2017   Procedure: TOTAL ABDOMINAL HYSTERECTOMY;  Surgeon: Florian Buff, MD;  Location: [facility];  Service: Gynecology;  Laterality: N/A;  . BREAST BIOPSY Left   . SALPINGOOPHORECTOMY Bilateral 10/27/2017   Procedure: BILATERAL SALPINGO OOPHORECTOMY;  Surgeon: Florian Buff, MD;  Location: [facility];  Service: Gynecology;  Laterality: Bilateral;  . TONSILLECTOMY      Current Outpatient Medications on File Prior to Visit  Medication Sig Dispense  Refill  . atenolol (TENORMIN) 50 MG tablet Take 50 mg by mouth daily.    . traMADol (ULTRAM) 50 MG tablet Take 50 mg by mouth every 6 (six) hours as needed.    Marland Kitchen atenolol-chlorthalidone (TENORETIC) 50-25 MG tablet TAKE 1 TABLET BY MOUTH  DAILY (Patient not taking: Reported on 11/09/2019) 90 tablet 0   No current facility-administered medications on file prior to visit.    Social History   Socioeconomic History  . Marital status: Widowed    Spouse name: Not on file  . Number of children: 2  . Years of education: 60  . Highest education level: Not on file  Occupational History  . Occupation: retired/disabled    Comment: board of [employer]  Tobacco Use  . Smoking status: Former Smoker    Packs/day: 0.25    Years: 5.00    Pack years: 1.25    Types: Cigarettes    Quit date: 10/23/1983    Years since quitting: 36.0  . Smokeless tobacco: Never Used  Substance and Sexual Activity  . Alcohol use: No  . Drug use: No  . Sexual activity: Not Currently    Birth control/protection: Post-menopausal    Comment: widow  Other Topics Concern  . Not on file  Social History Narrative   Lives alone   Children in [place]   Has lived in [place] since 2009 - husband was a Set designer to [employer], swims   reads at the  Art therapist   Social Determinants of Radio broadcast assistant Strain:   . Difficulty of Paying Living Expenses:   Food Insecurity:   . Worried About Charity fundraiser in the Last Year:   . Arboriculturist in the Last Year:   Transportation Needs:   . Film/video editor (Medical):   Marland Kitchen Lack of Transportation (Non-Medical):   Physical Activity:   . Days of Exercise per Week:   . Minutes of Exercise per Session:   Stress:   . Feeling of Stress :   Social Connections:   . Frequency of Communication with Friends and Family:   . Frequency of Social Gatherings with Friends and Family:   . Attends Religious Services:   . Active Member of Clubs or Organizations:   .  Attends Archivist Meetings:   Marland Kitchen Marital Status:   Intimate Partner Violence:   . Fear of Current or Ex-Partner:   . Emotionally Abused:   Marland Kitchen Physically Abused:   . Sexually Abused:     Family History  Problem Relation Age of Onset  . Stroke Mother   . Heart disease Mother   . Hypertension Mother   . Hyperlipidemia Mother   . Early death Father        bedridden by age of 17 ?  Marland Kitchen Alcohol abuse Father   . Cancer Paternal Aunt   . Diabetes Cousin     Temp (!) 97.5 F (36.4 C)   Ht 5\' 9"  (1.753 m)   Wt 248 lb (112.5 kg)   BMI 36.62 kg/m   Body mass index is 36.62 kg/m.     Objective:   Physical Exam Vitals and nursing note reviewed.  Constitutional:      Appearance: She is well-developed.  HENT:     Head: Normocephalic and atraumatic.  Eyes:     Conjunctiva/sclera: Conjunctivae normal.     Pupils: Pupils are equal, round, and reactive to light.  Cardiovascular:     Rate and Rhythm: Normal rate and regular rhythm.  Pulmonary:     Effort: Pulmonary effort is normal.  Abdominal:     Palpations: Abdomen is soft.  Musculoskeletal:       Arms:     Cervical back: Normal range of motion and neck supple.  Skin:    General: Skin is warm and dry.  Neurological:     Mental Status: She is alert and oriented to person, place, and time.     Cranial Nerves: No cranial nerve deficit.     Motor: No abnormal muscle tone.     Coordination: Coordination normal.     Deep Tendon Reflexes: Reflexes are normal and symmetric. Reflexes normal.  Psychiatric:        Behavior: Behavior normal.        Thought Content: Thought content normal.        Judgment: Judgment normal.           Assessment & Plan:   Encounter Diagnosis  Name Primary?  . Acute pain of right shoulder Yes   I will begin PT for the shoulder.  Continue the Relafen and home rubs.  Return in two weeks.  If not improved, consider MRI.  Call if any problem.  Precautions  discussed.   Electronically Signed Sanjuana Kava, MD 3/11/20218:16 AM

## 2019-11-11 ENCOUNTER — Ambulatory Visit: Payer: Medicare Other

## 2019-11-13 DIAGNOSIS — M25611 Stiffness of right shoulder, not elsewhere classified: Secondary | ICD-10-CM | POA: Diagnosis not present

## 2019-11-13 DIAGNOSIS — M25511 Pain in right shoulder: Secondary | ICD-10-CM | POA: Diagnosis not present

## 2019-11-13 DIAGNOSIS — R293 Abnormal posture: Secondary | ICD-10-CM | POA: Diagnosis not present

## 2019-11-13 DIAGNOSIS — M6281 Muscle weakness (generalized): Secondary | ICD-10-CM | POA: Diagnosis not present

## 2019-11-16 DIAGNOSIS — M25611 Stiffness of right shoulder, not elsewhere classified: Secondary | ICD-10-CM | POA: Diagnosis not present

## 2019-11-16 DIAGNOSIS — M25511 Pain in right shoulder: Secondary | ICD-10-CM | POA: Diagnosis not present

## 2019-11-16 DIAGNOSIS — M6281 Muscle weakness (generalized): Secondary | ICD-10-CM | POA: Diagnosis not present

## 2019-11-16 DIAGNOSIS — R293 Abnormal posture: Secondary | ICD-10-CM | POA: Diagnosis not present

## 2019-11-23 DIAGNOSIS — J302 Other seasonal allergic rhinitis: Secondary | ICD-10-CM | POA: Diagnosis not present

## 2019-11-23 DIAGNOSIS — M25511 Pain in right shoulder: Secondary | ICD-10-CM | POA: Diagnosis not present

## 2019-11-23 DIAGNOSIS — Z0001 Encounter for general adult medical examination with abnormal findings: Secondary | ICD-10-CM | POA: Diagnosis not present

## 2019-11-23 DIAGNOSIS — I10 Essential (primary) hypertension: Secondary | ICD-10-CM | POA: Diagnosis not present

## 2019-11-23 DIAGNOSIS — R293 Abnormal posture: Secondary | ICD-10-CM | POA: Diagnosis not present

## 2019-11-23 DIAGNOSIS — M25611 Stiffness of right shoulder, not elsewhere classified: Secondary | ICD-10-CM | POA: Diagnosis not present

## 2019-11-23 DIAGNOSIS — M6281 Muscle weakness (generalized): Secondary | ICD-10-CM | POA: Diagnosis not present

## 2019-11-27 DIAGNOSIS — M6281 Muscle weakness (generalized): Secondary | ICD-10-CM | POA: Diagnosis not present

## 2019-11-27 DIAGNOSIS — M25611 Stiffness of right shoulder, not elsewhere classified: Secondary | ICD-10-CM | POA: Diagnosis not present

## 2019-11-27 DIAGNOSIS — R293 Abnormal posture: Secondary | ICD-10-CM | POA: Diagnosis not present

## 2019-11-27 DIAGNOSIS — M25511 Pain in right shoulder: Secondary | ICD-10-CM | POA: Diagnosis not present

## 2019-11-28 ENCOUNTER — Other Ambulatory Visit: Payer: Self-pay

## 2019-11-28 ENCOUNTER — Encounter: Payer: Self-pay | Admitting: Orthopaedic Surgery

## 2019-11-28 ENCOUNTER — Ambulatory Visit (INDEPENDENT_AMBULATORY_CARE_PROVIDER_SITE_OTHER): Payer: Medicare Other | Admitting: Orthopaedic Surgery

## 2019-11-28 VITALS — Temp 97.9°F | Ht 69.0 in | Wt 244.0 lb

## 2019-11-28 DIAGNOSIS — M25511 Pain in right shoulder: Secondary | ICD-10-CM

## 2019-11-28 MED ORDER — HYDROCODONE-ACETAMINOPHEN 5-325 MG PO TABS: ORAL_TABLET | ORAL | 0 refills | Status: DC

## 2019-11-28 NOTE — Progress Notes (Signed)
Patient [id number] Yesenia Montgomery, female DOB:[birth year], 64 y.o. [id number]  Chief Complaint  Patient presents with  . Shoulder Pain    Rt shoulder    HPI  Yesenia Montgomery is a 64 y.o. female who has right shoulder pain.  She has been going to PT. She has been 4 times but I only have original visit note. She is slowly improving.  She has pain but much more motion.  She has no numbness, no new trauma.   Body mass index is 36.03 kg/m.  ROS  Review of Systems  Constitutional: Positive for activity change.  Musculoskeletal: Positive for arthralgias.  All other systems reviewed and are negative.   All other systems reviewed and are negative.  The following is a summary of the past history medically, past history surgically, known current medicines, social history and family history.  This information is gathered electronically by the computer from prior information and documentation.  I review this each visit and have found including this information at this point in the chart is beneficial and informative.    Past Medical History:  Diagnosis Date  . Arthritis    knees  . Chronic pain   . HLD (hyperlipidemia) 03/30/2017  . Hypertension     Past Surgical History:  Procedure Laterality Date  . ABDOMINAL HYSTERECTOMY N/A 10/27/2017   Procedure: TOTAL ABDOMINAL HYSTERECTOMY;  Surgeon: Florian Buff, MD;  Location: [facility];  Service: Gynecology;  Laterality: N/A;  . BREAST BIOPSY Left   . SALPINGOOPHORECTOMY Bilateral 10/27/2017   Procedure: BILATERAL SALPINGO OOPHORECTOMY;  Surgeon: Florian Buff, MD;  Location: [facility];  Service: Gynecology;  Laterality: Bilateral;  . TONSILLECTOMY      Family History  Problem Relation Age of Onset  . Stroke Mother   . Heart disease Mother   . Hypertension Mother   . Hyperlipidemia Mother   . Early death Father        bedridden by age of 56 ?  Marland Kitchen Alcohol abuse Father   . Cancer Paternal Aunt   . Diabetes Cousin     Social History Social  History   Tobacco Use  . Smoking status: Former Smoker    Packs/day: 0.25    Years: 5.00    Pack years: 1.25    Types: Cigarettes    Quit date: 10/23/1983    Years since quitting: 36.1  . Smokeless tobacco: Never Used  Substance Use Topics  . Alcohol use: No  . Drug use: No    Allergies  Allergen Reactions  . Latex Rash    Current Outpatient Medications  Medication Sig Dispense Refill  . atenolol (TENORMIN) 50 MG tablet Take 50 mg by mouth daily.    Marland Kitchen HYDROcodone-acetaminophen (NORCO/VICODIN) 5-325 MG tablet One tablet every four hours as needed for acute pain.  Limit of five days per [place] statue. 30 tablet 0  . traMADol (ULTRAM) 50 MG tablet Take 50 mg by mouth every 6 (six) hours as needed.     No current facility-administered medications for this visit.     Physical Exam  Temperature 97.9 F (36.6 C), height 5\' 9"  (1.753 m), weight 244 lb (110.7 kg).  Constitutional: overall normal hygiene, normal nutrition, well developed, normal grooming, normal body habitus. Assistive device:none  Musculoskeletal: gait and station Limp none, muscle tone and strength are normal, no tremors or atrophy is present.  .  Neurological: coordination overall normal.  Deep tendon reflex/nerve stretch intact.  Sensation normal.  Cranial nerves II-XII intact.  Skin:   Normal overall no scars, lesions, ulcers or rashes. No psoriasis.  Psychiatric: Alert and oriented x 3.  Recent memory intact, remote memory unclear.  Normal mood and affect. Well groomed.  Good eye contact.  Cardiovascular: overall no swelling, no varicosities, no edema bilaterally, normal temperatures of the legs and arms, no clubbing, cyanosis and good capillary refill.  Lymphatic: palpation is normal.  Right shoulder has near full ROM but pain in the extremes.  NV intact.  All other systems reviewed and are negative   The patient has been educated about the nature of the problem(s) and counseled on treatment  options.  The patient appeared to understand what I have discussed and is in agreement with it.  Encounter Diagnosis  Name Primary?  . Acute pain of right shoulder Yes    PLAN Call if any problems.  Precautions discussed.  Continue current medications.   Return to clinic 2 weeks   I have reviewed the [place] web site prior to prescribing narcotic medicine for this patient.   Electronically Signed Sanjuana Kava, MD 3/30/20212:13 PM

## 2019-12-08 DIAGNOSIS — R293 Abnormal posture: Secondary | ICD-10-CM | POA: Diagnosis not present

## 2019-12-08 DIAGNOSIS — M6281 Muscle weakness (generalized): Secondary | ICD-10-CM | POA: Diagnosis not present

## 2019-12-08 DIAGNOSIS — M25611 Stiffness of right shoulder, not elsewhere classified: Secondary | ICD-10-CM | POA: Diagnosis not present

## 2019-12-08 DIAGNOSIS — M25511 Pain in right shoulder: Secondary | ICD-10-CM | POA: Diagnosis not present

## 2019-12-12 ENCOUNTER — Other Ambulatory Visit: Payer: Self-pay

## 2019-12-12 ENCOUNTER — Ambulatory Visit: Payer: Medicare Other | Admitting: Orthopaedic Surgery

## 2019-12-12 ENCOUNTER — Encounter: Payer: Self-pay | Admitting: Orthopaedic Surgery

## 2019-12-12 VITALS — Temp 98.2°F | Ht 69.0 in | Wt 245.0 lb

## 2019-12-12 DIAGNOSIS — M25511 Pain in right shoulder: Secondary | ICD-10-CM | POA: Diagnosis not present

## 2019-12-12 DIAGNOSIS — G8929 Other chronic pain: Secondary | ICD-10-CM | POA: Diagnosis not present

## 2019-12-12 NOTE — Progress Notes (Signed)
Patient [id number] Yesenia Montgomery, female DOB:1956-08-09, 64 y.o. [id number]  Chief Complaint  Patient presents with  . Shoulder Pain    right     HPI  Yesenia Montgomery is a 64 y.o. female who has continued pain of the right shoulder.  She has been seen by PT at least four times.  I have reviewed the notes. She has pain after therapy and at night when rolling on the shoulder.  She is concerned she has not improved that much.    I am concerned about rotator cuff tear right.  I will get a MRI.   Body mass index is 36.18 kg/m.  ROS  Review of Systems  Constitutional: Positive for activity change.  Musculoskeletal: Positive for arthralgias.  All other systems reviewed and are negative.   All other systems reviewed and are negative.  The following is a summary of the past history medically, past history surgically, known current medicines, social history and family history.  This information is gathered electronically by the computer from prior information and documentation.  I review this each visit and have found including this information at this point in the chart is beneficial and informative.    Past Medical History:  Diagnosis Date  . Arthritis    knees  . Chronic pain   . HLD (hyperlipidemia) 03/30/2017  . Hypertension     Past Surgical History:  Procedure Laterality Date  . ABDOMINAL HYSTERECTOMY N/A 10/27/2017   Procedure: TOTAL ABDOMINAL HYSTERECTOMY;  Surgeon: Florian Buff, MD;  Location: [facility];  Service: Gynecology;  Laterality: N/A;  . BREAST BIOPSY Left   . SALPINGOOPHORECTOMY Bilateral 10/27/2017   Procedure: BILATERAL SALPINGO OOPHORECTOMY;  Surgeon: Florian Buff, MD;  Location: [facility];  Service: Gynecology;  Laterality: Bilateral;  . TONSILLECTOMY      Family History  Problem Relation Age of Onset  . Stroke Mother   . Heart disease Mother   . Hypertension Mother   . Hyperlipidemia Mother   . Early death Father        bedridden by age of 66 ?  Marland Kitchen Alcohol  abuse Father   . Cancer Paternal Aunt   . Diabetes Cousin     Social History Social History   Tobacco Use  . Smoking status: Former Smoker    Packs/day: 0.25    Years: 5.00    Pack years: 1.25    Types: Cigarettes    Quit date: 10/23/1983    Years since quitting: 36.1  . Smokeless tobacco: Never Used  Substance Use Topics  . Alcohol use: No  . Drug use: No    Allergies  Allergen Reactions  . Latex Rash    Current Outpatient Medications  Medication Sig Dispense Refill  . atenolol (TENORMIN) 50 MG tablet Take 50 mg by mouth daily.    Marland Kitchen HYDROcodone-acetaminophen (NORCO/VICODIN) 5-325 MG tablet One tablet every four hours as needed for acute pain.  Limit of five days per [place] statue. 30 tablet 0   No current facility-administered medications for this visit.     Physical Exam  Temperature 98.2 F (36.8 C), height 5\' 9"  (1.753 m), weight 245 lb (111.1 kg).  Constitutional: overall normal hygiene, normal nutrition, well developed, normal grooming, normal body habitus. Assistive device:none  Musculoskeletal: gait and station Limp none, muscle tone and strength are normal, no tremors or atrophy is present.  .  Neurological: coordination overall normal.  Deep tendon reflex/nerve stretch intact.  Sensation normal.  Cranial nerves II-XII  intact.   Skin:   Normal overall no scars, lesions, ulcers or rashes. No psoriasis.  Psychiatric: Alert and oriented x 3.  Recent memory intact, remote memory unclear.  Normal mood and affect. Well groomed.  Good eye contact.  Cardiovascular: overall no swelling, no varicosities, no edema bilaterally, normal temperatures of the legs and arms, no clubbing, cyanosis and good capillary refill.  Lymphatic: palpation is normal.  Right shoulder is tender with deceased motion.  NV intact.  All other systems reviewed and are negative   The patient has been educated about the nature of the problem(s) and counseled on treatment options.  The  patient appeared to understand what I have discussed and is in agreement with it.  Encounter Diagnosis  Name Primary?  . Chronic pain in right shoulder Yes    PLAN Call if any problems.  Precautions discussed.  Continue current medications.   Return to clinic 2 weeks   Get MRI right shoulder.  Continue exercises.  Electronically Signed Sanjuana Kava, MD 4/13/20211:44 PM

## 2019-12-14 DIAGNOSIS — M25511 Pain in right shoulder: Secondary | ICD-10-CM | POA: Diagnosis not present

## 2019-12-14 DIAGNOSIS — R293 Abnormal posture: Secondary | ICD-10-CM | POA: Diagnosis not present

## 2019-12-14 DIAGNOSIS — M25611 Stiffness of right shoulder, not elsewhere classified: Secondary | ICD-10-CM | POA: Diagnosis not present

## 2019-12-14 DIAGNOSIS — M6281 Muscle weakness (generalized): Secondary | ICD-10-CM | POA: Diagnosis not present

## 2019-12-15 ENCOUNTER — Telehealth: Payer: Self-pay

## 2019-12-15 NOTE — Telephone Encounter (Signed)
Patient called to give [place] a update on her MRI appointment which is May 8th and I have moved her return appointment with you after that. She wants to know if you want her to continue PT, states that it makes her shoulder hurt and that she wanted to be sure she wasn't doing more harm than good.    Please advise

## 2019-12-18 NOTE — Telephone Encounter (Signed)
If the PT is making her worse, she can stop it.

## 2019-12-20 DIAGNOSIS — R293 Abnormal posture: Secondary | ICD-10-CM | POA: Diagnosis not present

## 2019-12-20 DIAGNOSIS — M25611 Stiffness of right shoulder, not elsewhere classified: Secondary | ICD-10-CM | POA: Diagnosis not present

## 2019-12-20 DIAGNOSIS — M25511 Pain in right shoulder: Secondary | ICD-10-CM | POA: Diagnosis not present

## 2019-12-20 DIAGNOSIS — M6281 Muscle weakness (generalized): Secondary | ICD-10-CM | POA: Diagnosis not present

## 2019-12-22 DIAGNOSIS — M6281 Muscle weakness (generalized): Secondary | ICD-10-CM | POA: Diagnosis not present

## 2019-12-22 DIAGNOSIS — M25611 Stiffness of right shoulder, not elsewhere classified: Secondary | ICD-10-CM | POA: Diagnosis not present

## 2019-12-22 DIAGNOSIS — M25511 Pain in right shoulder: Secondary | ICD-10-CM | POA: Diagnosis not present

## 2019-12-22 DIAGNOSIS — R293 Abnormal posture: Secondary | ICD-10-CM | POA: Diagnosis not present

## 2019-12-26 ENCOUNTER — Ambulatory Visit: Payer: Medicare Other | Admitting: Orthopaedic Surgery

## 2019-12-27 DIAGNOSIS — M25511 Pain in right shoulder: Secondary | ICD-10-CM | POA: Diagnosis not present

## 2019-12-27 DIAGNOSIS — R293 Abnormal posture: Secondary | ICD-10-CM | POA: Diagnosis not present

## 2019-12-27 DIAGNOSIS — M25611 Stiffness of right shoulder, not elsewhere classified: Secondary | ICD-10-CM | POA: Diagnosis not present

## 2019-12-27 DIAGNOSIS — M6281 Muscle weakness (generalized): Secondary | ICD-10-CM | POA: Diagnosis not present

## 2020-01-02 DIAGNOSIS — M6281 Muscle weakness (generalized): Secondary | ICD-10-CM | POA: Diagnosis not present

## 2020-01-02 DIAGNOSIS — M25611 Stiffness of right shoulder, not elsewhere classified: Secondary | ICD-10-CM | POA: Diagnosis not present

## 2020-01-02 DIAGNOSIS — R293 Abnormal posture: Secondary | ICD-10-CM | POA: Diagnosis not present

## 2020-01-02 DIAGNOSIS — M25511 Pain in right shoulder: Secondary | ICD-10-CM | POA: Diagnosis not present

## 2020-01-04 ENCOUNTER — Ambulatory Visit
Admission: RE | Admit: 2020-01-04 | Discharge: 2020-01-04 | Disposition: A | Discharge status: Home / Self Care | Payer: Medicare Other | Source: Ambulatory Visit | Attending: Orthopaedic Surgery | Admitting: Orthopaedic Surgery

## 2020-01-04 ENCOUNTER — Other Ambulatory Visit: Payer: Self-pay

## 2020-01-04 DIAGNOSIS — G8929 Other chronic pain: Secondary | ICD-10-CM

## 2020-01-04 DIAGNOSIS — M75121 Complete rotator cuff tear or rupture of right shoulder, not specified as traumatic: Secondary | ICD-10-CM | POA: Diagnosis not present

## 2020-01-04 DIAGNOSIS — S46011A Strain of muscle(s) and tendon(s) of the rotator cuff of right shoulder, initial encounter: Secondary | ICD-10-CM | POA: Diagnosis not present

## 2020-01-09 ENCOUNTER — Ambulatory Visit (INDEPENDENT_AMBULATORY_CARE_PROVIDER_SITE_OTHER): Payer: Medicare Other | Admitting: Orthopaedic Surgery

## 2020-01-09 ENCOUNTER — Encounter: Payer: Self-pay | Admitting: Orthopaedic Surgery

## 2020-01-09 ENCOUNTER — Other Ambulatory Visit: Payer: Self-pay

## 2020-01-09 VITALS — Ht 69.0 in | Wt 245.0 lb

## 2020-01-09 DIAGNOSIS — M25511 Pain in right shoulder: Secondary | ICD-10-CM

## 2020-01-09 DIAGNOSIS — M25611 Stiffness of right shoulder, not elsewhere classified: Secondary | ICD-10-CM | POA: Diagnosis not present

## 2020-01-09 DIAGNOSIS — G8929 Other chronic pain: Secondary | ICD-10-CM

## 2020-01-09 DIAGNOSIS — R293 Abnormal posture: Secondary | ICD-10-CM | POA: Diagnosis not present

## 2020-01-09 DIAGNOSIS — M6281 Muscle weakness (generalized): Secondary | ICD-10-CM | POA: Diagnosis not present

## 2020-01-09 NOTE — Progress Notes (Signed)
Patient [id number] Yesenia Montgomery, female DOB:30-Jan-1956, 64 y.o. [id number]  Chief Complaint  Patient presents with  . Shoulder Pain    HPI  Yesenia Montgomery is a 64 y.o. female who has pain in the right shoulder that still exists after PT and rest.  She had MRI which showed: IMPRESSION: 1. Massive full-thickness rotator cuff tear with complete retracted tears of the supraspinatus and infraspinatus tendons. 2. Subscapularis tendinosis and partial tearing. 3. Large amount of fluid in the subacromial-subdeltoid and subcoracoid bursa. 4. The labrum and biceps tendon appear intact. 5. Prominent axillary lymph nodes, nonspecific, but likely reactive. Correlate clinically.  I have explained the findings to her. I recommend she see Dr. Aline Brochure to see if she is a candidate for surgery.  She agrees.  I have independently reviewed the MRI.  Body mass index is 36.18 kg/m.  ROS  Review of Systems  Constitutional: Positive for activity change.  Musculoskeletal: Positive for arthralgias.  All other systems reviewed and are negative.   All other systems reviewed and are negative.  The following is a summary of the past history medically, past history surgically, known current medicines, social history and family history.  This information is gathered electronically by the computer from prior information and documentation.  I review this each visit and have found including this information at this point in the chart is beneficial and informative.    Past Medical History:  Diagnosis Date  . Arthritis    knees  . Chronic pain   . HLD (hyperlipidemia) 03/30/2017  . Hypertension     Past Surgical History:  Procedure Laterality Date  . ABDOMINAL HYSTERECTOMY N/A 10/27/2017   Procedure: TOTAL ABDOMINAL HYSTERECTOMY;  Surgeon: Florian Buff, MD;  Location: [facility];  Service: Gynecology;  Laterality: N/A;  . BREAST BIOPSY Left   . SALPINGOOPHORECTOMY Bilateral 10/27/2017   Procedure: BILATERAL  SALPINGO OOPHORECTOMY;  Surgeon: Florian Buff, MD;  Location: [facility];  Service: Gynecology;  Laterality: Bilateral;  . TONSILLECTOMY      Family History  Problem Relation Age of Onset  . Stroke Mother   . Heart disease Mother   . Hypertension Mother   . Hyperlipidemia Mother   . Early death Father        bedridden by age of 66 ?  Marland Kitchen Alcohol abuse Father   . Cancer Paternal Aunt   . Diabetes Cousin     Social History Social History   Tobacco Use  . Smoking status: Former Smoker    Packs/day: 0.25    Years: 5.00    Pack years: 1.25    Types: Cigarettes    Quit date: 10/23/1983    Years since quitting: 36.2  . Smokeless tobacco: Never Used  Substance Use Topics  . Alcohol use: No  . Drug use: No    Allergies  Allergen Reactions  . Latex Rash    Current Outpatient Medications  Medication Sig Dispense Refill  . atenolol (TENORMIN) 50 MG tablet Take 50 mg by mouth daily.    Marland Kitchen HYDROcodone-acetaminophen (NORCO/VICODIN) 5-325 MG tablet One tablet every four hours as needed for acute pain.  Limit of five days per [place] statue. 30 tablet 0  . montelukast (SINGULAIR) 10 MG tablet Take 10 mg by mouth at bedtime.     No current facility-administered medications for this visit.     Physical Exam  Height 5\' 9"  (1.753 m), weight 245 lb (111.1 kg).  Constitutional: overall normal hygiene, normal nutrition, well developed,  normal grooming, normal body habitus. Assistive device:none  Musculoskeletal: gait and station Limp none, muscle tone and strength are normal, no tremors or atrophy is present.  .  Neurological: coordination overall normal.  Deep tendon reflex/nerve stretch intact.  Sensation normal.  Cranial nerves II-XII intact.   Skin:   Normal overall no scars, lesions, ulcers or rashes. No psoriasis.  Psychiatric: Alert and oriented x 3.  Recent memory intact, remote memory unclear.  Normal mood and affect. Well groomed.  Good eye contact.  Cardiovascular:  overall no swelling, no varicosities, no edema bilaterally, normal temperatures of the legs and arms, no clubbing, cyanosis and good capillary refill.  Lymphatic: palpation is normal.  Right shoulder with painful decreased ROM.  NV intact.  All other systems reviewed and are negative   The patient has been educated about the nature of the problem(s) and counseled on treatment options.  The patient appeared to understand what I have discussed and is in agreement with it.  Encounter Diagnosis  Name Primary?  . Chronic pain in right shoulder Yes    PLAN Call if any problems.  Precautions discussed.  Continue current medications.   Return to clinic to see Dr. Aline Brochure   Electronically Signed Sanjuana Kava, MD 5/11/202110:25 AM

## 2020-01-12 DIAGNOSIS — M25611 Stiffness of right shoulder, not elsewhere classified: Secondary | ICD-10-CM | POA: Diagnosis not present

## 2020-01-12 DIAGNOSIS — R293 Abnormal posture: Secondary | ICD-10-CM | POA: Diagnosis not present

## 2020-01-12 DIAGNOSIS — M6281 Muscle weakness (generalized): Secondary | ICD-10-CM | POA: Diagnosis not present

## 2020-01-12 DIAGNOSIS — M25511 Pain in right shoulder: Secondary | ICD-10-CM | POA: Diagnosis not present

## 2020-01-23 ENCOUNTER — Ambulatory Visit: Payer: Medicare Other | Admitting: Orthopedic Surgery

## 2020-01-23 ENCOUNTER — Encounter: Payer: Self-pay | Admitting: Orthopedic Surgery

## 2020-01-23 ENCOUNTER — Other Ambulatory Visit: Payer: Self-pay

## 2020-01-23 VITALS — BP 201/96 | HR 64 | Ht 69.0 in | Wt 245.0 lb

## 2020-01-23 DIAGNOSIS — M75121 Complete rotator cuff tear or rupture of right shoulder, not specified as traumatic: Secondary | ICD-10-CM | POA: Diagnosis not present

## 2020-01-23 DIAGNOSIS — G8929 Other chronic pain: Secondary | ICD-10-CM | POA: Diagnosis not present

## 2020-01-23 NOTE — Progress Notes (Signed)
Chief Complaint  Patient presents with  . Shoulder Pain    fall / over a month ago, right shoulder surgocal consult     48 female fell a month ago treated with physical therapy did not improve MRI shows massive rotator cuff tear.  Patient says she has no prodromal symptoms.  She is having trouble sleeping at night but surprisingly she has normal range of motion.  She has anterior and posterior pain depending on the position of the arm  Review of systems pain chronic right and left knee left greater than right previously seen and got injection with good relief no other symptoms.  Past Medical History:  Diagnosis Date  . Arthritis    knees  . Chronic pain   . HLD (hyperlipidemia) 03/30/2017  . Hypertension     BP (!) 201/96   Pulse 64   Ht 5\' 9"  (1.753 m)   Wt 245 lb (111.1 kg)   BMI 36.18 kg/m   Alert and oriented x3 mood and affect normal  Gait and station normal  Right shoulder is tender anteriorly and posteriorly but she has full range of motion in abduction as well as flexion she has weakness in external rotation and with the empty can sign and has pain moving the arm from full elevation down to her side  No swelling skin is normal supraclavicular lymph nodes are negative  Neurovascular exam is intact  Interpretation of MRI  MRI shows massive rotator cuff tear with atrophy infraspinatus  Plain film interpretation [facility] lateral and axillary x-rays Normal humeral head height and appearance mild greater tuberosity sclerosis sclerosis in the undersurface of the acromion glenohumeral joint space looks normal  Encounter Diagnoses  Name Primary?  . Chronic pain in right shoulder Yes  . Complete tear of right rotator cuff, unspecified whether traumatic     rec referral for 2nd opinion   Does she need surgery with this kind of motion? Then if so, what kind of sugrery ?

## 2020-01-31 ENCOUNTER — Ambulatory Visit (INDEPENDENT_AMBULATORY_CARE_PROVIDER_SITE_OTHER): Payer: Medicare Other | Admitting: Orthopedic Surgery

## 2020-01-31 ENCOUNTER — Other Ambulatory Visit: Payer: Self-pay

## 2020-01-31 DIAGNOSIS — M12811 Other specific arthropathies, not elsewhere classified, right shoulder: Secondary | ICD-10-CM

## 2020-02-01 ENCOUNTER — Encounter: Payer: Self-pay | Admitting: Orthopedic Surgery

## 2020-02-01 DIAGNOSIS — M12811 Other specific arthropathies, not elsewhere classified, right shoulder: Secondary | ICD-10-CM

## 2020-02-01 MED ORDER — LIDOCAINE HCL 1 % IJ SOLN
5.0000 mL | INTRAMUSCULAR | Status: AC | PRN
  Administered 2020-02-01: 5 mL

## 2020-02-01 MED ORDER — BUPIVACAINE HCL 0.5 % IJ SOLN
9.0000 mL | INTRAMUSCULAR | Status: AC | PRN
  Administered 2020-02-01: 9 mL via INTRA_ARTICULAR

## 2020-02-01 MED ORDER — METHYLPREDNISOLONE ACETATE 40 MG/ML IJ SUSP
40.0000 mg | INTRAMUSCULAR | Status: AC | PRN
  Administered 2020-02-01: 40 mg via INTRA_ARTICULAR

## 2020-02-01 NOTE — Progress Notes (Signed)
Office Visit Note   Patient: Yesenia Montgomery           Date of Birth: 13-Jan-1956           MRN: [id number] Visit Date: 01/31/2020 Requested by: Carole Civil, MD [address] [place],  [place] [address] PCP: Celene Squibb, MD  Subjective: Chief Complaint  Patient presents with  . Right Shoulder - Pain    HPI: Patient presents with right shoulder pain following a fall 3 months ago.  Does not report much in the way of shoulder problems prior to the fall.  She has an MRI scan which shows massive full-thickness rotator cuff tear with complete retracted tears of the supraspinatus and infraspinatus.  Subscapularis also has some tendinosis and partial tearing with intact teres minor.  Biceps tendon is still in place.  Patient describes having physical therapy in March and April.  She has some pain with use of the shoulder but still has overhead motion above shoulder level.  She describes a general combination of both weakness and pain in the shoulder.  She is on disability.              ROS: All systems reviewed are negative as they relate to the chief complaint within the history of present illness.  Patient denies  fevers or chills.   Assessment & Plan: Visit Diagnoses:  1. Rotator cuff arthropathy of right shoulder     Plan: Impression is retracted nonrepairable tears of the infraspinatus and supraspinatus in a patient who has maintained reasonably good function of that right shoulder.  Subscap and teres minor are intact.  The fall may have been the last straw in a gradually progressive degenerative rotator cuff tearing scenario of the supraspinatus infraspinatus.  Most of the time when I see such good functional ability in the shoulder with these types of tears the shoulder has had time to adapt to chronic tears over period of time as opposed to acute to tendon tears which would be very functionally debilitating.  Nonetheless she has good function now and wants to live with what she  has.  Subacromial injection performed today.  May need reverse replacement in the future.  Follow-up as needed.  Follow-Up Instructions: Return if symptoms worsen or fail to improve.   Orders:  No orders of the defined types were placed in this encounter.  No orders of the defined types were placed in this encounter.     Procedures: Large Joint Inj: R glenohumeral on 02/01/2020 11:00 PM Indications: diagnostic evaluation and pain Details: 18 G 1.5 in needle, posterior approach  Arthrogram: No  Medications: 9 mL bupivacaine 0.5 %; 40 mg methylPREDNISolone acetate 40 MG/ML; 5 mL lidocaine 1 % Outcome: tolerated well, no immediate complications Procedure, treatment alternatives, risks and benefits explained, specific risks discussed. Consent was given by the patient. Immediately prior to procedure a time out was called to verify the correct patient, procedure, equipment, support staff and site/side marked as required. Patient was prepped and draped in the usual sterile fashion.       Clinical Data: No additional findings.  Objective: Vital Signs: There were no vitals taken for this visit.  Physical Exam:   Constitutional: Patient appears well-developed HEENT:  Head: Normocephalic Eyes:EOM are normal Neck: Normal range of motion Cardiovascular: Normal rate Pulmonary/chest: Effort normal Neurologic: Patient is alert Skin: Skin is warm Psychiatric: Patient has normal mood and affect    Ortho Exam: Ortho exam demonstrates full active and  passive range of motion of that left shoulder.  A little bit of coarseness with internal/external rotation on the left at 90 degrees of abduction but strength is good.  On the right-hand side she does have some weakness to external rotation at 15 degrees of abduction at 3 out of 5 on the right compared to 5 out of 5 on the left.  She does have functional overhead motion with about 140 of forward flexion and 95 of abduction.  Motor sensory  function to the hand is intact.  Subscap strength 5+ out of 5 bilaterally.  No discrete AC joint tenderness.  Specialty Comments:  No specialty comments available.  Imaging: No results found.   PMFS History: Patient Active Problem List   Diagnosis Date Noted  . S/P TAH-BSO (total abdominal hysterectomy and bilateral salpingo-oophorectomy) 10/27/2017  . Fibroids 09/07/2017  . PMB (postmenopausal bleeding) 09/07/2017  . HLD (hyperlipidemia) 03/30/2017  . Pre-diabetes 03/26/2017  . Essential hypertension 03/04/2017  . Morbid obesity ([place]) 03/04/2017  . Elevated blood sugar level 03/04/2017  . OSTEOARTHRITIS, KNEES, BILATERAL, SEVERE 05/07/2010   Past Medical History:  Diagnosis Date  . Arthritis    knees  . Chronic pain   . HLD (hyperlipidemia) 03/30/2017  . Hypertension     Family History  Problem Relation Age of Onset  . Stroke Mother   . Heart disease Mother   . Hypertension Mother   . Hyperlipidemia Mother   . Early death Father        bedridden by age of 68 ?  Marland Kitchen Alcohol abuse Father   . Cancer Paternal Aunt   . Diabetes Cousin     Past Surgical History:  Procedure Laterality Date  . ABDOMINAL HYSTERECTOMY N/A 10/27/2017   Procedure: TOTAL ABDOMINAL HYSTERECTOMY;  Surgeon: Florian Buff, MD;  Location: [facility];  Service: Gynecology;  Laterality: N/A;  . BREAST BIOPSY Left   . SALPINGOOPHORECTOMY Bilateral 10/27/2017   Procedure: BILATERAL SALPINGO OOPHORECTOMY;  Surgeon: Florian Buff, MD;  Location: [facility];  Service: Gynecology;  Laterality: Bilateral;  . TONSILLECTOMY     Social History   Occupational History  . Occupation: retired/disabled    Comment: board of [employer]  Tobacco Use  . Smoking status: Former Smoker    Packs/day: 0.25    Years: 5.00    Pack years: 1.25    Types: Cigarettes    Quit date: 10/23/1983    Years since quitting: 36.3  . Smokeless tobacco: Never Used  Substance and Sexual Activity  . Alcohol use: No  . Drug  use: No  . Sexual activity: Not Currently    Birth control/protection: Post-menopausal    Comment: widow

## 2020-03-29 DIAGNOSIS — I10 Essential (primary) hypertension: Secondary | ICD-10-CM | POA: Diagnosis not present

## 2020-03-29 DIAGNOSIS — E782 Mixed hyperlipidemia: Secondary | ICD-10-CM | POA: Diagnosis not present

## 2020-03-29 DIAGNOSIS — R7301 Impaired fasting glucose: Secondary | ICD-10-CM | POA: Diagnosis not present

## 2020-04-04 DIAGNOSIS — R7301 Impaired fasting glucose: Secondary | ICD-10-CM | POA: Diagnosis not present

## 2020-04-04 DIAGNOSIS — R945 Abnormal results of liver function studies: Secondary | ICD-10-CM | POA: Diagnosis not present

## 2020-04-04 DIAGNOSIS — M12811 Other specific arthropathies, not elsewhere classified, right shoulder: Secondary | ICD-10-CM | POA: Diagnosis not present

## 2020-04-04 DIAGNOSIS — E782 Mixed hyperlipidemia: Secondary | ICD-10-CM | POA: Diagnosis not present

## 2020-04-04 DIAGNOSIS — I10 Essential (primary) hypertension: Secondary | ICD-10-CM | POA: Diagnosis not present

## 2020-05-22 DIAGNOSIS — M25572 Pain in left ankle and joints of left foot: Secondary | ICD-10-CM | POA: Diagnosis not present

## 2020-05-22 DIAGNOSIS — J302 Other seasonal allergic rhinitis: Secondary | ICD-10-CM | POA: Diagnosis not present

## 2020-05-22 DIAGNOSIS — I10 Essential (primary) hypertension: Secondary | ICD-10-CM | POA: Diagnosis not present

## 2020-05-22 DIAGNOSIS — N390 Urinary tract infection, site not specified: Secondary | ICD-10-CM | POA: Diagnosis not present

## 2020-05-22 DIAGNOSIS — R7301 Impaired fasting glucose: Secondary | ICD-10-CM | POA: Diagnosis not present

## 2020-05-22 DIAGNOSIS — L259 Unspecified contact dermatitis, unspecified cause: Secondary | ICD-10-CM | POA: Diagnosis not present

## 2020-05-22 DIAGNOSIS — R600 Localized edema: Secondary | ICD-10-CM | POA: Diagnosis not present

## 2020-05-22 DIAGNOSIS — Z0000 Encounter for general adult medical examination without abnormal findings: Secondary | ICD-10-CM | POA: Diagnosis not present

## 2020-05-27 ENCOUNTER — Encounter: Payer: Self-pay | Admitting: Orthopedic Surgery

## 2020-05-27 ENCOUNTER — Ambulatory Visit: Payer: Medicare Other

## 2020-05-27 ENCOUNTER — Other Ambulatory Visit: Payer: Self-pay

## 2020-05-27 ENCOUNTER — Ambulatory Visit (INDEPENDENT_AMBULATORY_CARE_PROVIDER_SITE_OTHER): Payer: Medicare Other | Admitting: Orthopedic Surgery

## 2020-05-27 VITALS — BP 177/99 | HR 66 | Ht 69.0 in | Wt 244.0 lb

## 2020-05-27 DIAGNOSIS — M75121 Complete rotator cuff tear or rupture of right shoulder, not specified as traumatic: Secondary | ICD-10-CM | POA: Diagnosis not present

## 2020-05-27 DIAGNOSIS — M25562 Pain in left knee: Secondary | ICD-10-CM

## 2020-05-27 DIAGNOSIS — G8929 Other chronic pain: Secondary | ICD-10-CM

## 2020-05-27 MED ORDER — MELOXICAM 7.5 MG PO TABS: 7.5000 mg | ORAL_TABLET | Freq: Every day | ORAL | 5 refills | Status: DC

## 2020-05-27 NOTE — Patient Instructions (Signed)
You have received an injection of steroids into the joint. 15% of patients will have increased pain within the 24 hours postinjection.   This is transient and will go away.   We recommend that you use ice packs on the injection site for 20 minutes every 2 hours and extra strength Tylenol 2 tablets every 8 as needed until the pain resolves.  If you continue to have pain after taking the Tylenol and using the ice please call the office for further instructions.  Start new arthritis medication

## 2020-05-27 NOTE — Progress Notes (Addendum)
Chief Complaint  Patient presents with  . Knee Pain    left knee, no injury or fall    Body mass index is 36.03 kg/m.   Assessment and plan 64 year old female with chronic left knee pain recurrent did well with injection repeat injection start meloxicam  Chronic pain right shoulder from chronic rotator cuff tear, seen by shoulder specialist Dr. Marlou Sa no surgery needed because patient had excellent range of motion pain is recurrent  Injected the right shoulder left knee start meloxicam follow-up as needed  History 64 year old female chronic left knee pain with worsening pain over the last 2 months previously took naproxen currently not on any medication complains of pseudolocking stiffness swelling pain behind the knee joint  Right shoulder chronic rotator cuff tear previously seen by Dr. Marlou Sa no surgery recommended because patient has excellent range of motion and function patient complaining of right shoulder pain  Review of Systems  Skin: Negative.   Neurological: Negative.      Past Medical History:  Diagnosis Date  . Arthritis    knees  . Chronic pain   . HLD (hyperlipidemia) 03/30/2017  . Hypertension     Patient Active Problem List   Diagnosis Date Noted  . S/P TAH-BSO (total abdominal hysterectomy and bilateral salpingo-oophorectomy) 10/27/2017  . Fibroids 09/07/2017  . PMB (postmenopausal bleeding) 09/07/2017  . HLD (hyperlipidemia) 03/30/2017  . Pre-diabetes 03/26/2017  . Essential hypertension 03/04/2017  . Morbid obesity ([place]) 03/04/2017  . Elevated blood sugar level 03/04/2017  . OSTEOARTHRITIS, KNEES, BILATERAL, SEVERE 05/07/2010   BP (!) 177/99   Pulse 66   Ht 5\' 9"  (1.753 m)   Wt 244 lb (110.7 kg)   BMI 36.03 kg/m   She is awake alert and oriented x3  Mood and affect are normal  She still has good range of motion the left knee but painful arc past 90 degrees  Ankle is normal  Tenderness lateral compartment  Strength normal  Neurovascular  exam intact   Right shoulder pain right shoulder pain mild weakness in abduction and flexion  Tenderness in the posterior cuff  Skin is intact  Lungs are normal  Shoulder stable  Internal office images 15 degree valgus osteoarthritis left knee with secondary bone changes and joint space narrowing rather severe  Meds ordered this encounter  Medications  . meloxicam (MOBIC) 7.5 MG tablet    Sig: Take 1 tablet (7.5 mg total) by mouth daily.    Dispense:  30 tablet    Refill:  5   Procedure note left knee injection   verbal consent was obtained to inject left knee joint  Timeout was completed to confirm the site of injection  The medications used were 40 mg of Depo-Medrol and 1% lidocaine 3 cc  Anesthesia was provided by ethyl chloride and the skin was prepped with alcohol.  After cleaning the skin with alcohol a 20-gauge needle was used to inject the left knee joint. There were no complications. A sterile bandage was applied.   Procedure note the subacromial injection shoulder RIGHT    Verbal consent was obtained to inject the  RIGHT   Shoulder  Timeout was completed to confirm the injection site is a subacromial space of the  RIGHT  shoulder   Medication used Depo-Medrol 40 mg and lidocaine 1% 3 cc  Anesthesia was provided by ethyl chloride  The injection was performed in the RIGHT  posterior subacromial space. After pinning the skin with alcohol and anesthetized the skin with ethyl  chloride the subacromial space was injected using a 20-gauge needle. There were no complications  Sterile dressing was applied.

## 2020-05-30 ENCOUNTER — Ambulatory Visit (HOSPITAL_COMMUNITY)
Admission: RE | Admit: 2020-05-30 | Discharge: 2020-05-30 | Disposition: A | Discharge status: Home / Self Care | Payer: Medicare Other | Source: Ambulatory Visit | Attending: Internal Medicine | Admitting: Internal Medicine

## 2020-05-30 ENCOUNTER — Other Ambulatory Visit (HOSPITAL_COMMUNITY): Payer: Self-pay | Admitting: Internal Medicine

## 2020-05-30 ENCOUNTER — Other Ambulatory Visit: Payer: Self-pay

## 2020-05-30 ENCOUNTER — Other Ambulatory Visit: Payer: Self-pay | Admitting: Internal Medicine

## 2020-05-30 DIAGNOSIS — K429 Umbilical hernia without obstruction or gangrene: Secondary | ICD-10-CM | POA: Diagnosis not present

## 2020-05-30 DIAGNOSIS — R109 Unspecified abdominal pain: Secondary | ICD-10-CM | POA: Diagnosis not present

## 2020-05-30 DIAGNOSIS — R590 Localized enlarged lymph nodes: Secondary | ICD-10-CM | POA: Diagnosis not present

## 2020-05-30 DIAGNOSIS — R319 Hematuria, unspecified: Secondary | ICD-10-CM

## 2020-05-30 DIAGNOSIS — N390 Urinary tract infection, site not specified: Secondary | ICD-10-CM | POA: Diagnosis not present

## 2020-05-30 DIAGNOSIS — Z1231 Encounter for screening mammogram for malignant neoplasm of breast: Secondary | ICD-10-CM

## 2020-05-30 DIAGNOSIS — R3129 Other microscopic hematuria: Secondary | ICD-10-CM | POA: Diagnosis not present

## 2020-06-03 ENCOUNTER — Other Ambulatory Visit: Payer: Self-pay

## 2020-06-03 ENCOUNTER — Ambulatory Visit (HOSPITAL_COMMUNITY)
Admission: RE | Admit: 2020-06-03 | Discharge: 2020-06-03 | Disposition: A | Discharge status: Home / Self Care | Payer: Medicare Other | Source: Ambulatory Visit | Attending: Internal Medicine | Admitting: Internal Medicine

## 2020-06-03 DIAGNOSIS — Z1231 Encounter for screening mammogram for malignant neoplasm of breast: Secondary | ICD-10-CM | POA: Insufficient documentation

## 2020-06-04 ENCOUNTER — Inpatient Hospital Stay (HOSPITAL_COMMUNITY): Payer: Medicare Other | Attending: Hematology | Admitting: Hematology

## 2020-06-04 ENCOUNTER — Encounter (HOSPITAL_COMMUNITY): Payer: Self-pay | Admitting: Hematology

## 2020-06-04 ENCOUNTER — Inpatient Hospital Stay (HOSPITAL_COMMUNITY): Payer: Medicare Other

## 2020-06-04 VITALS — BP 196/88 | HR 63 | Temp 97.2°F | Resp 20 | Ht 69.0 in | Wt 250.8 lb

## 2020-06-04 DIAGNOSIS — R590 Localized enlarged lymph nodes: Secondary | ICD-10-CM

## 2020-06-04 DIAGNOSIS — Z87891 Personal history of nicotine dependence: Secondary | ICD-10-CM | POA: Diagnosis not present

## 2020-06-04 DIAGNOSIS — I10 Essential (primary) hypertension: Secondary | ICD-10-CM | POA: Diagnosis not present

## 2020-06-04 LAB — CBC WITH DIFFERENTIAL/PLATELET
Abs Immature Granulocytes: 0.03 10*3/uL (ref 0.00–0.07)
Basophils Absolute: 0 10*3/uL (ref 0.0–0.1)
Basophils Relative: 1 %
Eosinophils Absolute: 0.2 10*3/uL (ref 0.0–0.5)
Eosinophils Relative: 3 %
HCT: 41.6 % (ref 36.0–46.0)
Hemoglobin: 13.1 g/dL (ref 12.0–15.0)
Immature Granulocytes: 1 %
Lymphocytes Relative: 25 %
Lymphs Abs: 1.5 10*3/uL (ref 0.7–4.0)
MCH: 29.4 pg (ref 26.0–34.0)
MCHC: 31.5 g/dL (ref 30.0–36.0)
MCV: 93.3 fL (ref 80.0–100.0)
Monocytes Absolute: 0.3 10*3/uL (ref 0.1–1.0)
Monocytes Relative: 6 %
Neutro Abs: 3.8 10*3/uL (ref 1.7–7.7)
Neutrophils Relative %: 64 %
Platelets: 269 10*3/uL (ref 150–400)
RBC: 4.46 MIL/uL (ref 3.87–5.11)
RDW: 13.4 % (ref 11.5–15.5)
WBC: 5.9 10*3/uL (ref 4.0–10.5)
nRBC: 0 % (ref 0.0–0.2)

## 2020-06-04 LAB — COMPREHENSIVE METABOLIC PANEL
ALT: 15 U/L (ref 0–44)
AST: 17 U/L (ref 15–41)
Albumin: 4.2 g/dL (ref 3.5–5.0)
Alkaline Phosphatase: 55 U/L (ref 38–126)
Anion gap: 9 (ref 5–15)
BUN: 16 mg/dL (ref 8–23)
CO2: 29 mmol/L (ref 22–32)
Calcium: 10 mg/dL (ref 8.9–10.3)
Chloride: 100 mmol/L (ref 98–111)
Creatinine, Ser: 0.82 mg/dL (ref 0.44–1.00)
GFR calc non Af Amer: >60 mL/min (ref >60)
Glucose, Bld: 110 mg/dL — ABNORMAL HIGH (ref 70–99)
Potassium: 3.8 mmol/L (ref 3.5–5.1)
Sodium: 138 mmol/L (ref 135–145)
Total Bilirubin: 0.7 mg/dL (ref 0.3–1.2)
Total Protein: 8.1 g/dL (ref 6.5–8.1)

## 2020-06-04 LAB — LACTATE DEHYDROGENASE: LDH: 135 U/L (ref 98–192)

## 2020-06-04 LAB — HEPATITIS B SURFACE ANTIGEN: Hepatitis B Surface Ag: NONREACTIVE

## 2020-06-04 LAB — HEPATITIS C ANTIBODY: HCV Ab: NONREACTIVE

## 2020-06-04 LAB — HEPATITIS B CORE ANTIBODY, TOTAL: Hep B Core Total Ab: NONREACTIVE

## 2020-06-04 LAB — HEPATITIS B SURFACE ANTIBODY,QUALITATIVE: Hep B S Ab: NONREACTIVE

## 2020-06-04 NOTE — Progress Notes (Signed)
[address], [place] [address]   CLINIC:  Medical Oncology/Hematology  CONSULT NOTE  Patient Care Team: Celene Squibb, MD as PCP - General (Internal Medicine)  CHIEF COMPLAINTS/PURPOSE OF CONSULTATION:  Evaluation of right inguinal lymphadenopathy  HISTORY OF PRESENTING ILLNESS:  Ms. Yesenia Montgomery 64 y.o. female is here because of evaluation of right inguinal lymphadenopathy, at the request of Dr. Allyn Kenner. She is also being set up for tissue sampling.  Today she is accompanied by her husband. She reports having constant aching pain in her left flank for the past 3 weeks, along with swelling in her left knee and ankle for 5 days; she denies any shooting pains down her leg. She has recently finished a course of Macrobid for 7 days. She denies having F/C, night sweats, or unexplained weight loss. She denies frequency or burning with urination or vaginal bleeding. She has done FOBT cards annually for the past 2 years and has never had a colonoscopy done. She had biopsies done on both breasts in 2017.  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 She denies exposure to chemicals or asbestos. She quit smoking years ago. Her maternal aunts had breast cancer and maternal uncle had gastric cancer. She does not have any pets.  MEDICAL HISTORY:  Past Medical History:  Diagnosis Date  . Arthritis    knees  . Chronic pain   . HLD (hyperlipidemia) 03/30/2017  . Hypertension     SURGICAL HISTORY: Past Surgical History:  Procedure Laterality Date  . ABDOMINAL HYSTERECTOMY N/A 10/27/2017   Procedure: TOTAL ABDOMINAL HYSTERECTOMY;  Surgeon: Florian Buff, MD;  Location: [facility];  Service: Gynecology;  Laterality: N/A;  . BREAST BIOPSY Left   . foot surgery Right   . SALPINGOOPHORECTOMY Bilateral 10/27/2017   Procedure: BILATERAL SALPINGO OOPHORECTOMY;  Surgeon: Florian Buff, MD;   Location: [facility];  Service: Gynecology;  Laterality: Bilateral;  . TONSILLECTOMY      SOCIAL HISTORY: Social History   Socioeconomic History  . Marital status: Married    Spouse name: Not on file  . Number of children: 2  . Years of education: 50  . Highest education level: Not on file  Occupational History  . Occupation: retired/disabled    Comment: board of [employer]  Tobacco Use  . Smoking status: Former Smoker    Packs/day: 0.25    Years: 5.00    Pack years: 1.25    Types: Cigarettes    Quit date: 10/23/1983    Years since quitting: 36.6  . Smokeless tobacco: Never Used  Vaping Use  . Vaping Use: Never used  Substance and Sexual Activity  . Alcohol use: Yes    Comment: occasional glass of wine  . Drug use: No  . Sexual activity: Not Currently    Birth control/protection: Post-menopausal    Comment: widow  Other Topics Concern  . Not on file  Social History Narrative   Lives alone   Children in [place]   Has lived in [place] since 2009 - husband was a Set designer to the [employer], swims   reads at [employer]   Social Determinants of [employer] Resource Strain:   . Difficulty of Paying Living Expenses: Not on file  Food Insecurity:   . Worried About Charity fundraiser in the Last Year: Not on file  .  Ran Out of Food in the Last Year: Not on file  Transportation Needs:   . Lack of Transportation (Medical): Not on file  . Lack of Transportation (Non-Medical): Not on file  Physical Activity:   . Days of Exercise per Week: Not on file  . Minutes of Exercise per Session: Not on file  Stress:   . Feeling of Stress : Not on file  Social Connections:   . Frequency of Communication with Friends and Family: Not on file  . Frequency of Social Gatherings with Friends and Family: Not on file  . Attends Religious Services: Not on file  . Active Member of Clubs or Organizations: Not on file  . Attends Archivist Meetings: Not on file  . Marital  Status: Not on file  Intimate Partner Violence:   . Fear of Current or Ex-Partner: Not on file  . Emotionally Abused: Not on file  . Physically Abused: Not on file  . Sexually Abused: Not on file    FAMILY HISTORY: Family History  Problem Relation Age of Onset  . Stroke Mother   . Heart disease Mother   . Hypertension Mother   . Hyperlipidemia Mother   . Heart attack Mother   . Early death Father        bedridden by age of 4 ?  Marland Kitchen Alcohol abuse Father   . Cancer Paternal Aunt   . Diabetes Cousin     ALLERGIES:  is allergic to latex.  MEDICATIONS:  Current Outpatient Medications  Medication Sig Dispense Refill  . atenolol (TENORMIN) 50 MG tablet Take 50 mg by mouth daily.    . furosemide (LASIX) 20 MG tablet Take 20 mg by mouth daily as needed.    Marland Kitchen levocetirizine (XYZAL) 5 MG tablet Take 5 mg by mouth daily before breakfast.     . meloxicam (MOBIC) 7.5 MG tablet Take 1 tablet (7.5 mg total) by mouth daily. 30 tablet 5  . montelukast (SINGULAIR) 10 MG tablet Take 10 mg by mouth at bedtime.    . triamcinolone ointment (KENALOG) 0.5 % Apply topically 2 (two) times daily.     No current facility-administered medications for this visit.    REVIEW OF SYSTEMS:   Review of Systems  Constitutional: Negative for appetite change, chills, diaphoresis, fatigue, fever and unexpected weight change.  Gastrointestinal: Positive for abdominal pain (5/10 L flank ache and pain).  Genitourinary: Negative for dysuria, frequency and vaginal bleeding.      PHYSICAL EXAMINATION: ECOG PERFORMANCE STATUS: 1 - Symptomatic but completely ambulatory  Vitals:   06/04/20 1312  BP: (!) 196/88  Pulse: 63  Resp: 20  Temp: (!) 97.2 F (36.2 C)  SpO2: 96%   Filed Weights   06/04/20 1312  Weight: 250 lb 12.8 oz (113.8 kg)   Physical Exam Vitals reviewed.  Constitutional:      Appearance: Normal appearance. She is obese.  Cardiovascular:     Rate and Rhythm: Normal rate and regular  rhythm.     Pulses: Normal pulses.     Heart sounds: Normal heart sounds.  Pulmonary:     Effort: Pulmonary effort is normal.     Breath sounds: Normal breath sounds.  Abdominal:     Palpations: Abdomen is soft. There is no hepatomegaly, splenomegaly or mass.     Tenderness: There is no abdominal tenderness. There is no right CVA tenderness or left CVA tenderness.     Hernia: No hernia is present.  Musculoskeletal:  Right lower leg: No edema.     Left lower leg: No edema.  Lymphadenopathy:     Upper Body:     Right upper body: No axillary or pectoral adenopathy.     Left upper body: No axillary or pectoral adenopathy.     Lower Body: Right inguinal adenopathy (1-2 cm LN) present. No left inguinal adenopathy.  Neurological:     General: No focal deficit present.     Mental Status: She is alert and oriented to person, place, and time.  Psychiatric:        Mood and Affect: Mood normal.        Behavior: Behavior normal.      LABORATORY DATA:  I have reviewed the data as listed CBC Latest Ref Rng & Units 10/28/2017 10/22/2017 03/04/2017  WBC 4.0 - 10.5 K/uL 5.9 5.0 4.6  Hemoglobin 12.0 - 15.0 g/dL 11.0(L) 13.3 13.1  Hematocrit 36 - 46 % 34.7(L) 42.5 40.4  Platelets 150 - 400 K/uL 229 262 289   CMP Latest Ref Rng & Units 10/28/2017 10/22/2017 03/04/2017  Glucose 65 - 99 mg/dL 99 105(H) 89  BUN 6 - 20 mg/dL 17 18 21   Creatinine 0.44 - 1.00 mg/dL 0.91 0.90 0.90  Sodium 135 - 145 mmol/L 138 137 137  Potassium 3.5 - 5.1 mmol/L 3.8 3.6 4.1  Chloride 101 - 111 mmol/L 102 98(L) 99  CO2 22 - 32 mmol/L 29 29 28   Calcium 8.9 - 10.3 mg/dL 9.0 9.6 9.9  Total Protein 6.5 - 8.1 g/dL - 8.0 7.7  Total Bilirubin 0.3 - 1.2 mg/dL - 0.6 0.6  Alkaline Phos 38 - 126 U/L - 66 57  AST 15 - 41 U/L - 28 24  ALT 14 - 54 U/L - 26 24    RADIOGRAPHIC STUDIES: I have personally reviewed the radiological images as listed and agreed with the findings in the report. CT ABDOMEN PELVIS WO CONTRAST  Result  Date: 05/30/2020 CLINICAL DATA:  Left flank pain, microscopic hematuria. EXAM: CT ABDOMEN AND PELVIS WITHOUT CONTRAST TECHNIQUE: Multidetector CT imaging of the abdomen and pelvis was performed following the standard protocol without IV contrast. COMPARISON:  None. FINDINGS: Lower chest: No acute abnormality. Hepatobiliary: No focal liver abnormality is seen. No gallstones, gallbladder wall thickening, or biliary dilatation. Pancreas: Unremarkable. No pancreatic ductal dilatation or surrounding inflammatory changes. Spleen: Normal in size without focal abnormality. Adrenals/Urinary Tract: Adrenal glands appear normal. No hydronephrosis or renal obstruction is noted. No renal or ureteral calculi are noted. Probable angiomyolipoma measuring 5.8 x 3.3 cm is seen arising from lower pole of left kidney. Urinary bladder is unremarkable. Stomach/Bowel: Stomach is within normal limits. Appendix appears normal. No evidence of bowel wall thickening, distention, or inflammatory changes. Vascular/Lymphatic: No significant vascular abnormality is noted. 3.2 x 2.0 cm right inguinal lymph node is noted. 2.9 x 2.3 cm right external iliac lymph node is noted. Reproductive: Status post hysterectomy. No adnexal masses. Other: Small fat containing periumbilical hernia is noted. No ascites is noted. Musculoskeletal: No acute or significant osseous findings. IMPRESSION: 1. Enlarged right inguinal and external iliac lymph nodes are noted which are concerning for malignancy or lymphoma. Tissue sampling is recommended. These results will be called to the ordering clinician or representative by the Radiologist Assistant, and communication documented in the PACS or zVision Dashboard. 2. 5.8 x 3.3 cm fat containing lesion arising from lower pole of left kidney most consistent with renal angiomyolipoma. Electronically Signed   By: Marijo Conception  M.D.   On: 05/30/2020 16:55   DG Knee AP/LAT W/Sunrise Left  Result Date: 05/27/2020 3 views  left knee Left knee pain The knee is in valgus 15 degrees the lateral compartment is narrowed but not obliterated there are cysts and bone sclerosis and osteophytes and then medially we also see similar osteophytes Patellofemoral joint shows osteophytes without narrowing Peripheral osteophytes in the axial view of the patella with a centered patella Severe arthritis 15 degree valgus left knee   ASSESSMENT:  1.  Right inguinal and external iliac lymphadenopathy: -Patient presented to Dr. Nevada Crane with left flank pain, left knee pain and left ankle swelling with rash for 3 weeks.  Denies any fevers or chills or dysuria or hematuria. -CTAP without contrast on 05/30/2020 showed enlarged right inguinal and external iliac lymph nodes measuring up to 3 cm concerning for malignancy/lymphoma. -He does not have any fevers, night sweats or weight loss in the last 6 months. -Patient never had colonoscopy.  Reports stool cards were negative for the past 2 years.  No vaginal bleeding reported. -Had mammogram on 06/03/2020 which is still to be read.  Previous mammogram was normal. -Had a right axillary lymph node biopsy in 2017 which showed florid lymphoid hyperplasia.  Left breast core biopsy showed fibrocystic changes and usual ductal hyperplasia.  2.  Left kidney angiomyolipoma: -There is a 5.8 x 3.3 cm fat-containing lesion arising in the lower pole of the left kidney incidentally seen on CT scan consistent with renal angiomyolipoma.  3.  Social/family history: -She is currently retired and denies any chemical exposure.  She quit smoking several decades ago. -2 maternal aunts had breast cancer in 1 maternal uncle had stomach cancer.  PLAN:  1.  Right inguinal and external iliac lymphadenopathy: -Today's physical exam did not reveal any palpable adenopathy in other areas.  Right inguinal lymph node is weakly palpable. -Recommend LDH and chemistries today.  We will also check hepatitis panel. -I have  recommended a PET CT scan.  If there is significant hypermetabolic lymphadenopathy, will consider biopsy. -RTC after PET CT scan next Monday. -Also discussed with her Andee Poles who was on the phone from [place].   All questions were answered. The patient knows to call the clinic with any problems, questions or concerns.    Derek Jack, MD, 06/04/20 2:21 PM  [facility] [phone]   I, Milinda Antis, am acting as a scribe for Dr. Sanda Linger.  I, Derek Jack MD, have reviewed the above documentation for accuracy and completeness, and I agree with the above.

## 2020-06-04 NOTE — Patient Instructions (Signed)
[place] at [facility] Discharge Instructions  You were seen and examined today by Dr. Delton Coombes. Dr. Delton Coombes is a medical oncologist, meaning he specializes in the medical management of cancer. Dr. Delton Coombes discussed your past medical history, family history of cancer and current functional status, including your ability to take care of yourself and eating habits.  On your recent CT scan, there appears to be an enlarged lymph node in the right side of your groin. Dr. Delton Coombes has recommended a PET scan, which identifies cancer present in the body as well as additional lab work to be done today.  If the PET scan lights up, we will proceed with a tissue sample to accurately identify the cause of the lymph node.  We will arrange for the PET scan to be done and will see you back following the PET scan.     Thank you for choosing [place] at [facility] to provide your oncology and hematology care.  To afford each patient quality time with our provider, please arrive at least 15 minutes before your scheduled appointment time.   If you have a lab appointment with the [facility] please come in thru the Main Entrance and check in at the main information desk.  You need to re-schedule your appointment should you arrive 10 or more minutes late.  We strive to give you quality time with our providers, and arriving late affects you and other patients whose appointments are after yours.  Also, if you no show three or more times for appointments you may be dismissed from the clinic at the providers discretion.     Again, thank you for choosing [facility].  Our hope is that these requests will decrease the amount of time that you wait before being seen by our physicians.       _____________________________________________________________  Should you have questions after your visit to [facility], please contact our office  at [phone] and follow the prompts.  Our office hours are 8:00 a.m. and 4:30 p.m. Monday - Friday.  Please note that voicemails left after 4:00 p.m. may not be returned until the following business day.  We are closed weekends and major holidays.  You do have access to a nurse 24-7, just call the main number to the clinic [phone] and do not press any options, hold on the line and a nurse will answer the phone.    For prescription refill requests, have your pharmacy contact our office and allow 72 hours.    Due to Covid, you will need to wear a mask upon entering the hospital. If you do not have a mask, a mask will be given to you at the Main Entrance upon arrival. For doctor visits, patients may have 1 support person age 40 or older with them. For treatment visits, patients can not have anyone with them due to social distancing guidelines and our immunocompromised population.

## 2020-06-10 ENCOUNTER — Other Ambulatory Visit: Payer: Self-pay

## 2020-06-10 ENCOUNTER — Encounter (HOSPITAL_COMMUNITY)
Admission: RE | Admit: 2020-06-10 | Discharge: 2020-06-10 | Disposition: A | Discharge status: Home / Self Care | Payer: Medicare Other | Source: Ambulatory Visit | Attending: Hematology | Admitting: Hematology

## 2020-06-10 DIAGNOSIS — D1771 Benign lipomatous neoplasm of kidney: Secondary | ICD-10-CM | POA: Diagnosis not present

## 2020-06-10 DIAGNOSIS — R590 Localized enlarged lymph nodes: Secondary | ICD-10-CM | POA: Insufficient documentation

## 2020-06-10 DIAGNOSIS — I2510 Atherosclerotic heart disease of native coronary artery without angina pectoris: Secondary | ICD-10-CM | POA: Diagnosis not present

## 2020-06-10 DIAGNOSIS — I700 Atherosclerosis of aorta: Secondary | ICD-10-CM | POA: Diagnosis not present

## 2020-06-10 DIAGNOSIS — N2889 Other specified disorders of kidney and ureter: Secondary | ICD-10-CM | POA: Diagnosis not present

## 2020-06-10 MED ORDER — FLUDEOXYGLUCOSE F - 18 (FDG) INJECTION
13.1400 | Freq: Once | INTRAVENOUS | Status: AC | PRN
  Administered 2020-06-10: 13.14 via INTRAVENOUS

## 2020-06-10 NOTE — Progress Notes (Signed)
ok 

## 2020-06-13 ENCOUNTER — Other Ambulatory Visit: Payer: Self-pay

## 2020-06-13 ENCOUNTER — Ambulatory Visit (INDEPENDENT_AMBULATORY_CARE_PROVIDER_SITE_OTHER): Payer: Medicare Other | Admitting: General Surgery

## 2020-06-13 ENCOUNTER — Inpatient Hospital Stay (HOSPITAL_BASED_OUTPATIENT_CLINIC_OR_DEPARTMENT_OTHER): Payer: Medicare Other | Admitting: Hematology

## 2020-06-13 ENCOUNTER — Encounter: Payer: Self-pay | Admitting: General Surgery

## 2020-06-13 VITALS — BP 191/102 | HR 62 | Temp 99.1°F | Resp 18 | Ht 69.0 in | Wt 252.0 lb

## 2020-06-13 VITALS — BP 200/90 | HR 63 | Temp 97.5°F | Resp 20 | Wt 253.2 lb

## 2020-06-13 DIAGNOSIS — I10 Essential (primary) hypertension: Secondary | ICD-10-CM | POA: Diagnosis not present

## 2020-06-13 DIAGNOSIS — R590 Localized enlarged lymph nodes: Secondary | ICD-10-CM

## 2020-06-13 DIAGNOSIS — Z87891 Personal history of nicotine dependence: Secondary | ICD-10-CM | POA: Diagnosis not present

## 2020-06-13 NOTE — Progress Notes (Signed)
[facility] History and Physical  Reason for Referral: Right inguinal adenopathy Referring Physician:  Dr. Delton Coombes   Chief Complaint    New Patient (Initial Visit)      Yesenia Montgomery is a 64 y.o. female.  HPI: Yesenia Montgomery is a 64 yo who has been worked up for findings of adenopathy and renal mass on CT that Dr. Nevada Crane obtained after the patient presented with left flank pain and hematuria. She had a CT that demonstrated a mass on the left kidney as well as right inguinal and iliac adenopathy. She was then referred to Dr. Delton Coombes who obtained a PET scan and the right inguinal node is hypermetabolic on the PET but the renal mass is not and is consistent with a benign left renal angiomyolipoma. She says she is still having the left flank pain. She has not noticed anything in the right groin.  She has a family history of maternal aunts and uncle with breast cancer.  Her mammogram was obtained 10/4 and was normal.    Past Medical History:  Diagnosis Date  . Arthritis    knees  . Chronic pain   . HLD (hyperlipidemia) 03/30/2017  . Hypertension     Past Surgical History:  Procedure Laterality Date  . ABDOMINAL HYSTERECTOMY N/A 10/27/2017   Procedure: TOTAL ABDOMINAL HYSTERECTOMY;  Surgeon: Florian Buff, MD;  Location: [facility];  Service: Gynecology;  Laterality: N/A;  . BREAST BIOPSY Left   . foot surgery Right   . SALPINGOOPHORECTOMY Bilateral 10/27/2017   Procedure: BILATERAL SALPINGO OOPHORECTOMY;  Surgeon: Florian Buff, MD;  Location: [facility];  Service: Gynecology;  Laterality: Bilateral;  . TONSILLECTOMY      Family History  Problem Relation Age of Onset  . Stroke Mother   . Heart disease Mother   . Hypertension Mother   . Hyperlipidemia Mother   . Heart attack Mother   . Early death Father        bedridden by age of 66 ?  Marland Kitchen Alcohol abuse Father   . Cancer Paternal Aunt   . Diabetes Cousin   . Breast cancer Maternal Aunt   . Breast cancer Maternal  Uncle     Social History   Tobacco Use  . Smoking status: Former Smoker    Packs/day: 0.25    Years: 5.00    Pack years: 1.25    Types: Cigarettes    Quit date: 10/23/1983    Years since quitting: 36.6  . Smokeless tobacco: Never Used  Vaping Use  . Vaping Use: Never used  Substance Use Topics  . Alcohol use: Yes    Comment: occasional glass of wine  . Drug use: No    Medications: I have reviewed the patient's current medications. Allergies as of 06/13/2020      Reactions   Latex Rash   Waist trainer belt      Medication List       Accurate as of June 13, 2020 11:59 PM. If you have any questions, ask your nurse or doctor.        STOP taking these medications   furosemide 20 MG tablet Commonly known as: LASIX Stopped by: Derek Jack, MD   triamcinolone ointment 0.5 % Commonly known as: KENALOG Stopped by: Derek Jack, MD     TAKE these medications   aspirin EC 81 MG tablet Take 81 mg by mouth daily. Swallow whole.   atenolol 50 MG tablet Commonly known as: TENORMIN Take 50 mg  by mouth daily.   levocetirizine 5 MG tablet Commonly known as: XYZAL Take 5 mg by mouth daily before breakfast.   meloxicam 7.5 MG tablet Commonly known as: Mobic Take 1 tablet (7.5 mg total) by mouth daily.   montelukast 10 MG tablet Commonly known as: SINGULAIR Take 10 mg by mouth at bedtime.   OVER THE COUNTER MEDICATION Take 1 capsule by mouth 2 (two) times daily with a meal. [employer] weight loss supplement   Potassium 99 MG Tabs Take 99 mg by mouth daily.   VITAMIN A PO Take 1 tablet by mouth daily.        ROS:  A comprehensive review of systems was negative except for: Musculoskeletal: positive for back pain and left flank pain and bilateral leg pain  Blood pressure (!) 191/102, pulse 62, temperature 99.1 F (37.3 C), temperature source Oral, resp. rate 18, height 5\' 9"  (1.753 m), weight 252 lb (114.3 kg), SpO2 94 %. Physical  Exam Vitals reviewed.  Constitutional:      Appearance: Normal appearance.  HENT:     Head: Normocephalic.     Nose: Nose normal.     Mouth/Throat:     Mouth: Mucous membranes are moist.  Eyes:     Extraocular Movements: Extraocular movements intact.  Cardiovascular:     Rate and Rhythm: Normal rate and regular rhythm.  Pulmonary:     Effort: Pulmonary effort is normal.     Breath sounds: Normal breath sounds.  Abdominal:     General: There is no distension.     Palpations: Abdomen is soft.     Tenderness: There is no abdominal tenderness.  Musculoskeletal:        General: No swelling.  Lymphadenopathy:     Upper Body:     Right upper body: No axillary adenopathy.     Left upper body: No axillary adenopathy.     Lower Body: Right inguinal adenopathy present. No left inguinal adenopathy.  Skin:    General: Skin is warm and dry.  Neurological:     General: No focal deficit present.     Mental Status: She is alert and oriented to person, place, and time.  Psychiatric:        Mood and Affect: Mood normal.        Behavior: Behavior normal.        Thought Content: Thought content normal.        Judgment: Judgment normal.     Results: Personally reviewed CT and PET scan of right inguinal region- lymph node present just medial and superior to the inferior epigastric vessels   Assessment & Plan:  Yesenia Montgomery is a 64 y.o. female with right inguinal adenopathy of unknown significance that could be lymphoma versus reactive. - Discussed inguinal node excisional biopsy and risk of bleeding, infection, lymph leak, non diagnostic pathology    All questions were answered to the satisfaction of the patient.    Virl Cagey 06/14/2020, 11:48 AM

## 2020-06-13 NOTE — Patient Instructions (Signed)
[place] at [facility] Discharge Instructions  You were seen today by Dr. Delton Coombes. He went over your recent results and scans; your inguinal lymph nodes are lighting up but your kidney cyst is benign. You will be referred to a general surgeon for your inguinal lymph node excision. Dr. Delton Coombes will see you back after your biopsy for follow up.   Thank you for choosing [place] at [facility] to provide your oncology and hematology care.  To afford each patient quality time with our provider, please arrive at least 15 minutes before your scheduled appointment time.   If you have a lab appointment with the [facility] please come in thru the Main Entrance and check in at the main information desk  You need to re-schedule your appointment should you arrive 10 or more minutes late.  We strive to give you quality time with our providers, and arriving late affects you and other patients whose appointments are after yours.  Also, if you no show three or more times for appointments you may be dismissed from the clinic at the providers discretion.     Again, thank you for choosing [facility].  Our hope is that these requests will decrease the amount of time that you wait before being seen by our physicians.       _____________________________________________________________  Should you have questions after your visit to [facility], please contact our office at [phone] between the hours of 8:00 a.m. and 4:30 p.m.  Voicemails left after 4:00 p.m. will not be returned until the following business day.  For prescription refill requests, have your pharmacy contact our office and allow 72 hours.    Cancer Center Support Programs:   > Cancer Support Group  2nd Tuesday of the month 1pm-2pm, Journey Room

## 2020-06-13 NOTE — Patient Instructions (Signed)
Open Lymph Node Biopsy °An open lymph node biopsy is a procedure to remove a lymph node so that it can be examined under a microscope. Lymph nodes are part of the body's disease-fighting system (immune system). The immune system protects the body from infections, germs, and diseases. An open lymph node biopsy may be done to: °· Look for germs or cancer cells in your lymph node. °· Find out why your lymph node is swollen. °· Find out more about a condition you have. °Lymph nodes are found in many locations in the body. Biopsies are often done on lymph nodes in the head, neck, armpit, or groin. °Tell a health care provider about: °· Any allergies you have. °· All medicines you are taking, including vitamins, herbs, eye drops, creams, and over-the-counter medicines. °· Any problems you or family members have had with anesthetic medicines. °· Any blood disorders you have. °· Any surgeries you have had. °· Any medical conditions you have or have had. °· Whether you are pregnant or may be pregnant. °What are the risks? °Generally, this is a safe procedure. However, problems may occur, including: °· Infection. °· Bleeding. °· Allergic reactions to medicines. °· Damage to other structures or organs, such as a nerve. °· Scarring. °What happens before the procedure? °Medicines °Ask your health care provider about: °· Changing or stopping your regular medicines. This is especially important if you are taking diabetes medicines or blood thinners. °· Taking medicines such as aspirin and ibuprofen. These medicines can thin your blood. Do not take these medicines unless your health care provider tells you to take them. °· Taking over-the-counter medicines, vitamins, herbs, and supplements. °General instructions °· Follow instructions from your health care provider about eating or drinking restrictions. °· You may have an exam or testing. °· You may have a blood or urine sample taken. °· Plan to have someone take you home from the  hospital or clinic. °· If you will be going home right after the procedure, plan to have someone with you for 24 hours. °· Ask your health care provider how your surgical site will be marked or identified. °· Ask your health care provider what steps will be taken to help prevent infection. These may include: °? Removing hair at the biopsy site. °? Washing skin with a germ-killing soap. °? Taking antibiotic medicine. °What happens during the procedure? ° °· An IV will be inserted into one of your veins. °· You will be given one or more of the following: °? A medicine to help you relax (sedative). °? A medicine to numb the area (local anesthetic). °· An incision will be made in the area where your lymph node is located. °· Your lymph node will be removed. °· Your incision will be closed with stitches (sutures). °· An antibiotic ointment may be applied to your incision. °· A bandage (dressing) will be placed over your incision. °The procedure may vary among health care providers and hospitals. °What happens after the procedure? °· Your blood pressure, heart rate, breathing rate, and blood oxygen level will be monitored until you leave the hospital or clinic. °· Do not drive for 24 hours if you were given a sedative during your procedure. °· It is up to you to get the results of your procedure. Ask your health care provider, or the department that is doing the procedure, when your results will be ready. °Summary °· An open lymph node biopsy is a procedure to remove a lymph node so that   it can be checked for infections, germs, and disease. °· Generally, this is a safe procedure. However, problems may occur, including bleeding, infection, allergic reaction to medicines, and damage to other structures or organs. °· Follow your health care provider's instructions before the procedure. These may include changing or stopping some medicines and restricting what you eat and drink. °· During the procedure, an incision will be  made in the area of the lymph node, the lymph node will be removed, and the incision will be closed with sutures. °· You will be monitored after the procedure. Do not drive for 24 hours if you were given a sedative during your procedure. °This information is not intended to replace advice given to you by your health care provider. Make sure you discuss any questions you have with your health care provider. °Document Revised: 03/24/2018 Document Reviewed: 03/24/2018 °[employer]vier Patient Education © 2020 [employer] ° °

## 2020-06-13 NOTE — Progress Notes (Signed)
[place], [place] [address]   CLINIC:  Medical Oncology/Hematology  PCP:  Celene Squibb, MD [address] [place] [address]  [phone]  REASON FOR VISIT:  Follow-up for right inguinal lymphadenopathy  PRIOR THERAPY: None  CURRENT THERAPY: Under work-up  INTERVAL HISTORY:  Ms. Yesenia Montgomery, a 64 y.o. female, returns for routine follow-up for her right inguinal lymphadenopathy. Kayte was last seen on 06/04/2020.  Today she reports feeling well. She reports having left flank pain. Her inguinal lymph nodes continue being painful.   REVIEW OF SYSTEMS:  Review of Systems  Constitutional: Negative for appetite change and fatigue.  Respiratory: Positive for cough (d/t allergies).   Gastrointestinal: Positive for abdominal pain (5/10 L flank pain).  Neurological: Positive for headaches (d/t IV dye).  Psychiatric/Behavioral: Positive for sleep disturbance.  All other systems reviewed and are negative.   PAST MEDICAL/SURGICAL HISTORY:  Past Medical History:  Diagnosis Date   Arthritis    knees   Chronic pain    HLD (hyperlipidemia) 03/30/2017   Hypertension    Past Surgical History:  Procedure Laterality Date   ABDOMINAL HYSTERECTOMY N/A 10/27/2017   Procedure: TOTAL ABDOMINAL HYSTERECTOMY;  Surgeon: Florian Buff, MD;  Location: [facility];  Service: Gynecology;  Laterality: N/A;   BREAST BIOPSY Left    foot surgery Right    SALPINGOOPHORECTOMY Bilateral 10/27/2017   Procedure: BILATERAL SALPINGO OOPHORECTOMY;  Surgeon: Florian Buff, MD;  Location: [facility];  Service: Gynecology;  Laterality: Bilateral;   TONSILLECTOMY      SOCIAL HISTORY:  Social History   Socioeconomic History   Marital status: Married    Spouse name: Not on file   Number of children: 2   Years of education: 12   Highest education level: Not on file  Occupational History   Occupation: retired/disabled    Comment: board of  [employer]  Tobacco Use   Smoking status: Former Smoker    Packs/day: 0.25    Years: 5.00    Pack years: 1.25    Types: Cigarettes    Quit date: 10/23/1983    Years since quitting: 36.6   Smokeless tobacco: Never Used  Vaping Use   Vaping Use: Never used  Substance and Sexual Activity   Alcohol use: Yes    Comment: occasional glass of wine   Drug use: No   Sexual activity: Not Currently    Birth control/protection: Post-menopausal    Comment: widow  Other Topics Concern   Not on file  Social History Narrative   Lives alone   Children in [place]   Has lived in [place] since 2009 - husband was a Set designer to the [employer], swims   reads at the [employer] Determinants of Health   Financial Resource Strain:    Difficulty of Paying Living Expenses: Not on file  Food Insecurity:    Worried About Charity fundraiser in the Last Year: Not on file   [employer] of Food in the Last Year: Not on file  Transportation Needs:    Lack of Transportation (Medical): Not on file   Lack of Transportation (Non-Medical): Not on file  Physical Activity:    Days of Exercise per Week: Not on file   Minutes of Exercise per Session: Not on file  Stress:    Feeling of Stress : Not on file  Social Connections:    Frequency of Communication  with Friends and Family: Not on file   Frequency of Social Gatherings with Friends and Family: Not on file   Attends Religious Services: Not on file   Active Member of Clubs or Organizations: Not on file   Attends Archivist Meetings: Not on file   Marital Status: Not on file  Intimate Partner Violence:    Fear of Current or Ex-Partner: Not on file   Emotionally Abused: Not on file   Physically Abused: Not on file   Sexually Abused: Not on file    FAMILY HISTORY:  Family History  Problem Relation Age of Onset   Stroke Mother    Heart disease Mother    Hypertension Mother    Hyperlipidemia Mother    Heart  attack Mother    Early death Father        bedridden by age of 58 ?   Alcohol abuse Father    Cancer Paternal Aunt    Diabetes Cousin     CURRENT MEDICATIONS:  Current Outpatient Medications  Medication Sig Dispense Refill   atenolol (TENORMIN) 50 MG tablet Take 50 mg by mouth daily.     levocetirizine (XYZAL) 5 MG tablet Take 5 mg by mouth daily before breakfast.      meloxicam (MOBIC) 7.5 MG tablet Take 1 tablet (7.5 mg total) by mouth daily. 30 tablet 5   montelukast (SINGULAIR) 10 MG tablet Take 10 mg by mouth at bedtime.     No current facility-administered medications for this visit.    ALLERGIES:  Allergies  Allergen Reactions   Latex Rash    Waist trainer belt    PHYSICAL EXAM:  Performance status (ECOG): 1 - Symptomatic but completely ambulatory  Vitals:   06/13/20 1131  BP: (!) 200/90  Pulse: 63  Resp: 20  Temp: (!) 97.5 F (36.4 C)  SpO2: 94%   Wt Readings from Last 3 Encounters:  06/13/20 253 lb 3.2 oz (114.9 kg)  06/04/20 250 lb 12.8 oz (113.8 kg)  05/27/20 244 lb (110.7 kg)   Physical Exam Vitals reviewed.  Constitutional:      Appearance: Normal appearance. She is obese.  Neurological:     General: No focal deficit present.     Mental Status: She is alert and oriented to person, place, and time.  Psychiatric:        Mood and Affect: Mood normal.        Behavior: Behavior normal.     LABORATORY DATA:  I have reviewed the labs as listed.  CBC Latest Ref Rng & Units 06/04/2020 10/28/2017 10/22/2017  WBC 4.0 - 10.5 K/uL 5.9 5.9 5.0  Hemoglobin 12.0 - 15.0 g/dL 13.1 11.0(L) 13.3  Hematocrit 36 - 46 % 41.6 34.7(L) 42.5  Platelets 150 - 400 K/uL 269 229 262   CMP Latest Ref Rng & Units 06/04/2020 10/28/2017 10/22/2017  Glucose 70 - 99 mg/dL 110(H) 99 105(H)  BUN 8 - 23 mg/dL 16 17 18   Creatinine 0.44 - 1.00 mg/dL 0.82 0.91 0.90  Sodium 135 - 145 mmol/L 138 138 137  Potassium 3.5 - 5.1 mmol/L 3.8 3.8 3.6  Chloride 98 - 111 mmol/L 100 102  98(L)  CO2 22 - 32 mmol/L 29 29 29   Calcium 8.9 - 10.3 mg/dL 10.0 9.0 9.6  Total Protein 6.5 - 8.1 g/dL 8.1 - 8.0  Total Bilirubin 0.3 - 1.2 mg/dL 0.7 - 0.6  Alkaline Phos 38 - 126 U/L 55 - 66  AST 15 - 41 U/L  17 - 28  ALT 0 - 44 U/L 15 - 26      Component Value Date/Time   RBC 4.46 06/04/2020 1435   MCV 93.3 06/04/2020 1435   MCH 29.4 06/04/2020 1435   MCHC 31.5 06/04/2020 1435   RDW 13.4 06/04/2020 1435   LYMPHSABS 1.5 06/04/2020 1435   MONOABS 0.3 06/04/2020 1435   EOSABS 0.2 06/04/2020 1435   BASOSABS 0.0 06/04/2020 1435    DIAGNOSTIC IMAGING:  I have independently reviewed the scans and discussed with the patient. CT ABDOMEN PELVIS WO CONTRAST  Result Date: 05/30/2020 CLINICAL DATA:  Left flank pain, microscopic hematuria. EXAM: CT ABDOMEN AND PELVIS WITHOUT CONTRAST TECHNIQUE: Multidetector CT imaging of the abdomen and pelvis was performed following the standard protocol without IV contrast. COMPARISON:  None. FINDINGS: Lower chest: No acute abnormality. Hepatobiliary: No focal liver abnormality is seen. No gallstones, gallbladder wall thickening, or biliary dilatation. Pancreas: Unremarkable. No pancreatic ductal dilatation or surrounding inflammatory changes. Spleen: Normal in size without focal abnormality. Adrenals/Urinary Tract: Adrenal glands appear normal. No hydronephrosis or renal obstruction is noted. No renal or ureteral calculi are noted. Probable angiomyolipoma measuring 5.8 x 3.3 cm is seen arising from lower pole of left kidney. Urinary bladder is unremarkable. Stomach/Bowel: Stomach is within normal limits. Appendix appears normal. No evidence of bowel wall thickening, distention, or inflammatory changes. Vascular/Lymphatic: No significant vascular abnormality is noted. 3.2 x 2.0 cm right inguinal lymph node is noted. 2.9 x 2.3 cm right external iliac lymph node is noted. Reproductive: Status post hysterectomy. No adnexal masses. Other: Small fat containing  periumbilical hernia is noted. No ascites is noted. Musculoskeletal: No acute or significant osseous findings. IMPRESSION: 1. Enlarged right inguinal and external iliac lymph nodes are noted which are concerning for malignancy or lymphoma. Tissue sampling is recommended. These results will be called to the ordering clinician or representative by the Radiologist Assistant, and communication documented in the PACS or zVision Dashboard. 2. 5.8 x 3.3 cm fat containing lesion arising from lower pole of left kidney most consistent with renal angiomyolipoma. Electronically Signed   By: Marijo Conception M.D.   On: 05/30/2020 16:55   NM PET Image Initial (PI) Skull Base To Thigh  Result Date: 06/11/2020 CLINICAL DATA:  Initial treatment strategy for left renal mass. Right inguinal lymphadenopathy. EXAM: NUCLEAR MEDICINE PET SKULL BASE TO THIGH TECHNIQUE: 13.1 mCi F-18 FDG was injected intravenously. Full-ring PET imaging was performed from the skull base to thigh after the radiotracer. CT data was obtained and used for attenuation correction and anatomic localization. Fasting blood glucose: 98 mg/dl COMPARISON:  CT abdomen/pelvis dated 05/30/2020 FINDINGS: Mediastinal blood pool activity: SUV max 1.9 Liver activity: SUV max NA NECK: No hypermetabolic cervical lymphadenopathy. Incidental CT findings: none CHEST: No suspicious pulmonary nodules. 10 mm short axis right axillary node (series 3/image 86), max SUV 6.0. Otherwise, no hypermetabolic thoracic lymphadenopathy. Incidental CT findings: Mild atherosclerotic calcifications of the aortic arch. Mild coronary atherosclerosis of the LAD. ABDOMEN/PELVIS: No abnormal hypermetabolism in the liver, spleen, pancreas, or adrenal glands. 3.0 x 4.4 cm fat density lesion arising from the left lower kidney (series 3/image 170), compatible with a benign renal angiomyolipoma, non FDG avid. No hypermetabolic abdominal/retroperitoneal lymphadenopathy. 2.2 cm short axis right external  iliac node (series 3/image 235), max SUV 4.0. 2.0 cm short axis right inguinal node (series 3/image 250), max SUV 10.1. Incidental CT findings: Mild layering gallbladder sludge versus noncalcified gallstones (series 3/image 172). Mild atherosclerotic calcifications the abdominal aorta  and branch vessels. Status post hysterectomy. SKELETON: No focal hypermetabolic activity to suggest skeletal metastasis. Incidental CT findings: Degenerative changes of the visualized thoracolumbar spine. IMPRESSION: Hypermetabolic right inguinal/external iliac nodes, raising concern for lymphoma or nodal metastases. Consider surgical excision of the right inguinal node for tissue diagnosis. Small hypermetabolic right axillary node, possibly reactive. Correlate for recent prior COVID vaccination in the right arm. Otherwise, this warrants attention on follow-up. Benign left renal angiomyolipoma. No findings suspicious for primary malignancy. Electronically Signed   By: Julian Hy M.D.   On: 06/11/2020 09:32   MM 3D SCREEN BREAST BILATERAL  Result Date: 06/05/2020 CLINICAL DATA:  Screening. EXAM: DIGITAL SCREENING BILATERAL MAMMOGRAM WITH TOMO AND CAD COMPARISON:  Previous exam(s). ACR Breast Density Category b: There are scattered areas of fibroglandular density. FINDINGS: There are no findings suspicious for malignancy. Images were processed with CAD. IMPRESSION: No mammographic evidence of malignancy. A result letter of this screening mammogram will be mailed directly to the patient. RECOMMENDATION: Screening mammogram in one year. (Code:SM-B-01Y) BI-RADS CATEGORY  1: Negative. Electronically Signed   By: Claudie Revering M.D.   On: 06/05/2020 14:28   DG Knee AP/LAT W/Sunrise Left  Result Date: 05/27/2020 3 views left knee Left knee pain The knee is in valgus 15 degrees the lateral compartment is narrowed but not obliterated there are cysts and bone sclerosis and osteophytes and then medially we also see similar osteophytes  Patellofemoral joint shows osteophytes without narrowing Peripheral osteophytes in the axial view of the patella with a centered patella Severe arthritis 15 degree valgus left knee    ASSESSMENT:  1.  Right inguinal and external iliac lymphadenopathy: -Patient presented to Dr. Nevada Crane with left flank pain, left knee pain and left ankle swelling with rash for 3 weeks.  Denies any fevers or chills or dysuria or hematuria. -CTAP without contrast on 05/30/2020 showed enlarged right inguinal and external iliac lymph nodes measuring up to 3 cm concerning for malignancy/lymphoma. -He does not have any fevers, night sweats or weight loss in the last 6 months. -Patient never had colonoscopy.  Reports stool cards were negative for the past 2 years.  No vaginal bleeding reported. -Had mammogram on 06/03/2020 which is still to be read.  Previous mammogram was normal. -Had a right axillary lymph node biopsy in 2017 which showed florid lymphoid hyperplasia.  Left breast core biopsy showed fibrocystic changes and usual ductal hyperplasia. -PET scan on 06/10/2020 shows 2.2 cm right external iliac node SUV 4.0.  2 cm right inguinal node SUV 10.1.  A 10 mm right axillary lymph node SUV 6.0. -Left kidney mass did not show any uptake.  2.  Left kidney angiomyolipoma: -There is a 5.8 x 3.3 cm fat-containing lesion arising in the lower pole of the left kidney incidentally seen on CT scan consistent with renal angiomyolipoma.  3.  Social/family history: -She is currently retired and denies any chemical exposure.  She quit smoking several decades ago. -2 maternal aunts had breast cancer in 1 maternal uncle had stomach cancer.   PLAN:  1.  Right inguinal and external iliac lymphadenopathy: -I have reviewed PET scan images with the patient.  I have also discussed with her daughter Quillian Quince on the phone from [place]. -LDH is normal.  PET scan showed hypermetabolic right inguinal lymph node with SUV of 10.1.  I have  recommended excisional biopsy of this lymph node. -RTC after biopsy to discuss treatment plan.  2.  Left kidney mass: -This did not show  any uptake on the PET scan consistent with benign angiomyolipoma.  Orders placed this encounter:  No orders of the defined types were placed in this encounter.    Derek Jack, MD [place] [phone]   I, Milinda Antis, am acting as a scribe for Dr. Sanda Linger.  I, Derek Jack MD, have reviewed the above documentation for accuracy and completeness, and I agree with the above.

## 2020-06-14 ENCOUNTER — Encounter: Payer: Self-pay | Admitting: General Surgery

## 2020-06-14 NOTE — H&P (Signed)
[facility] History and Physical  Reason for Referral: Right inguinal adenopathy Referring Physician:  Dr. Delton Coombes   Chief Complaint    New Patient (Initial Visit)      Yesenia Montgomery is a 64 y.o. female.  HPI: Yesenia Montgomery is a 64 yo who has been worked up for findings of adenopathy and renal mass on CT that Dr. Nevada Crane obtained after the patient presented with left flank pain and hematuria. She had a CT that demonstrated a mass on the left kidney as well as right inguinal and iliac adenopathy. She was then referred to Dr. Delton Coombes who obtained a PET scan and the right inguinal node is hypermetabolic on the PET but the renal mass is not and is consistent with a benign left renal angiomyolipoma. She says she is still having the left flank pain. She has not noticed anything in the right groin.  She has a family history of maternal aunts and uncle with breast cancer.  Her mammogram was obtained 10/4 and was normal.    Past Medical History:  Diagnosis Date  . Arthritis    knees  . Chronic pain   . HLD (hyperlipidemia) 03/30/2017  . Hypertension     Past Surgical History:  Procedure Laterality Date  . ABDOMINAL HYSTERECTOMY N/A 10/27/2017   Procedure: TOTAL ABDOMINAL HYSTERECTOMY;  Surgeon: Florian Buff, MD;  Location: [facility];  Service: Gynecology;  Laterality: N/A;  . BREAST BIOPSY Left   . foot surgery Right   . SALPINGOOPHORECTOMY Bilateral 10/27/2017   Procedure: BILATERAL SALPINGO OOPHORECTOMY;  Surgeon: Florian Buff, MD;  Location: [facility];  Service: Gynecology;  Laterality: Bilateral;  . TONSILLECTOMY      Family History  Problem Relation Age of Onset  . Stroke Mother   . Heart disease Mother   . Hypertension Mother   . Hyperlipidemia Mother   . Heart attack Mother   . Early death Father        bedridden by age of 31 ?  Marland Kitchen Alcohol abuse Father   . Cancer Paternal Aunt   . Diabetes Cousin   . Breast cancer Maternal Aunt   . Breast cancer Maternal  Uncle     Social History   Tobacco Use  . Smoking status: Former Smoker    Packs/day: 0.25    Years: 5.00    Pack years: 1.25    Types: Cigarettes    Quit date: 10/23/1983    Years since quitting: 36.6  . Smokeless tobacco: Never Used  Vaping Use  . Vaping Use: Never used  Substance Use Topics  . Alcohol use: Yes    Comment: occasional glass of wine  . Drug use: No    Medications: I have reviewed the patient's current medications. Allergies as of 06/13/2020      Reactions   Latex Rash   Waist trainer belt      Medication List       Accurate as of June 13, 2020 11:59 PM. If you have any questions, ask your nurse or doctor.        STOP taking these medications   furosemide 20 MG tablet Commonly known as: LASIX Stopped by: Derek Jack, MD   triamcinolone ointment 0.5 % Commonly known as: KENALOG Stopped by: Derek Jack, MD     TAKE these medications   aspirin EC 81 MG tablet Take 81 mg by mouth daily. Swallow whole.   atenolol 50 MG tablet Commonly known as: TENORMIN Take 50 mg  by mouth daily.   levocetirizine 5 MG tablet Commonly known as: XYZAL Take 5 mg by mouth daily before breakfast.   meloxicam 7.5 MG tablet Commonly known as: Mobic Take 1 tablet (7.5 mg total) by mouth daily.   montelukast 10 MG tablet Commonly known as: SINGULAIR Take 10 mg by mouth at bedtime.   OVER THE COUNTER MEDICATION Take 1 capsule by mouth 2 (two) times daily with a meal. [employer] weight loss supplement   Potassium 99 MG Tabs Take 99 mg by mouth daily.   VITAMIN A PO Take 1 tablet by mouth daily.        ROS:  A comprehensive review of systems was negative except for: Musculoskeletal: positive for back pain and left flank pain and bilateral leg pain  Blood pressure (!) 191/102, pulse 62, temperature 99.1 F (37.3 C), temperature source Oral, resp. rate 18, height 5\' 9"  (1.753 m), weight 252 lb (114.3 kg), SpO2 94 %. Physical  Exam Vitals reviewed.  Constitutional:      Appearance: Normal appearance.  HENT:     Head: Normocephalic.     Nose: Nose normal.     Mouth/Throat:     Mouth: Mucous membranes are moist.  Eyes:     Extraocular Movements: Extraocular movements intact.  Cardiovascular:     Rate and Rhythm: Normal rate and regular rhythm.  Pulmonary:     Effort: Pulmonary effort is normal.     Breath sounds: Normal breath sounds.  Abdominal:     General: There is no distension.     Palpations: Abdomen is soft.     Tenderness: There is no abdominal tenderness.  Musculoskeletal:        General: No swelling.  Lymphadenopathy:     Upper Body:     Right upper body: No axillary adenopathy.     Left upper body: No axillary adenopathy.     Lower Body: Right inguinal adenopathy present. No left inguinal adenopathy.  Skin:    General: Skin is warm and dry.  Neurological:     General: No focal deficit present.     Mental Status: She is alert and oriented to person, place, and time.  Psychiatric:        Mood and Affect: Mood normal.        Behavior: Behavior normal.        Thought Content: Thought content normal.        Judgment: Judgment normal.     Results: Personally reviewed CT and PET scan of right inguinal region- lymph node present just medial and superior to the inferior epigastric vessels   Assessment & Plan:  Yesenia Montgomery is a 64 y.o. female with right inguinal adenopathy of unknown significance that could be lymphoma versus reactive. - Discussed inguinal node excisional biopsy and risk of bleeding, infection, lymph leak, non diagnostic pathology  -HTN on the exam, patient says she has white coat syndrome and it is always more elevated, home measurements more 140/80, she will take at home and trend, on atenolol 50 mg daily   All questions were answered to the satisfaction of the patient.    Virl Cagey 06/14/2020, 11:48 AM

## 2020-06-17 ENCOUNTER — Encounter (HOSPITAL_COMMUNITY)
Admission: RE | Admit: 2020-06-17 | Discharge: 2020-06-17 | Disposition: A | Discharge status: Home / Self Care | Payer: Medicare Other | Source: Ambulatory Visit | Attending: General Surgery | Admitting: General Surgery

## 2020-06-17 ENCOUNTER — Other Ambulatory Visit (HOSPITAL_COMMUNITY)
Admission: RE | Admit: 2020-06-17 | Discharge: 2020-06-17 | Disposition: A | Discharge status: Home / Self Care | Payer: Medicare Other | Source: Ambulatory Visit | Attending: General Surgery | Admitting: General Surgery

## 2020-06-17 ENCOUNTER — Encounter (HOSPITAL_COMMUNITY): Payer: Self-pay

## 2020-06-17 ENCOUNTER — Other Ambulatory Visit: Payer: Self-pay

## 2020-06-17 DIAGNOSIS — Z20822 Contact with and (suspected) exposure to covid-19: Secondary | ICD-10-CM | POA: Insufficient documentation

## 2020-06-17 DIAGNOSIS — Z01812 Encounter for preprocedural laboratory examination: Secondary | ICD-10-CM | POA: Insufficient documentation

## 2020-06-17 DIAGNOSIS — Z01818 Encounter for other preprocedural examination: Secondary | ICD-10-CM | POA: Insufficient documentation

## 2020-06-17 NOTE — Patient Instructions (Signed)
Yesenia Montgomery  06/17/2020     @PREFPERIOPPHARMACY @   Your procedure is scheduled on  06/19/2020.  Report to [facility] at  0900  A.M.  Call this number if you have problems the morning of surgery:  [phone]   Remember:  Do not eat or drink after midnight.                       Take these medicines the morning of surgery with A SIP OF WATER  Atenolol, xyzal, mobic(if needed).    Do not wear jewelry, make-up or nail polish.  Do not wear lotions, powders, or perfumes. Please wear deodorant and brush your teeth.  Do not shave 48 hours prior to surgery.  Men may shave face and neck.  Do not bring valuables to the hospital.  [facility] is not responsible for any belongings or valuables.  Contacts, dentures or bridgework may not be worn into surgery.  Leave your suitcase in the car.  After surgery it may be brought to your room.  For patients admitted to the hospital, discharge time will be determined by your treatment team.  Patients discharged the day of surgery will not be allowed to drive home.   Name and phone number of your driver:   family Special instructions:   DO NOT smoke the morning of your procedure.  Please read over the following fact sheets that you were given. Anesthesia Post-op Instructions and Care and Recovery After Surgery       General Anesthesia, Adult, Care After This sheet gives you information about how to care for yourself after your procedure. Your health care provider may also give you more specific instructions. If you have problems or questions, contact your health care provider. What can I expect after the procedure? After the procedure, the following side effects are common:  Pain or discomfort at the IV site.  Nausea.  Vomiting.  Sore throat.  Trouble concentrating.  Feeling cold or chills.  Weak or tired.  Sleepiness and fatigue.  Soreness and body aches. These side effects can affect parts of the body that  were not involved in surgery. Follow these instructions at home:  For at least 24 hours after the procedure:  Have a responsible adult stay with you. It is important to have someone help care for you until you are awake and alert.  Rest as needed.  Do not: ? Participate in activities in which you could fall or become injured. ? Drive. ? Use heavy machinery. ? Drink alcohol. ? Take sleeping pills or medicines that cause drowsiness. ? Make important decisions or sign legal documents. ? Take care of children on your own. Eating and drinking  Follow any instructions from your health care provider about eating or drinking restrictions.  When you feel hungry, start by eating small amounts of foods that are soft and easy to digest (bland), such as toast. Gradually return to your regular diet.  Drink enough fluid to keep your urine pale yellow.  If you vomit, rehydrate by drinking water, juice, or clear broth. General instructions  If you have sleep apnea, surgery and certain medicines can increase your risk for breathing problems. Follow instructions from your health care provider about wearing your sleep device: ? Anytime you are sleeping, including during daytime naps. ? While taking prescription pain medicines, sleeping medicines, or medicines that make you drowsy.  Return to your normal activities as  told by your health care provider. Ask your health care provider what activities are safe for you.  Take over-the-counter and prescription medicines only as told by your health care provider.  If you smoke, do not smoke without supervision.  Keep all follow-up visits as told by your health care provider. This is important. Contact a health care provider if:  You have nausea or vomiting that does not get better with medicine.  You cannot eat or drink without vomiting.  You have pain that does not get better with medicine.  You are unable to pass urine.  You develop a skin  rash.  You have a fever.  You have redness around your IV site that gets worse. Get help right away if:  You have difficulty breathing.  You have chest pain.  You have blood in your urine or stool, or you vomit blood. Summary  After the procedure, it is common to have a sore throat or nausea. It is also common to feel tired.  Have a responsible adult stay with you for the first 24 hours after general anesthesia. It is important to have someone help care for you until you are awake and alert.  When you feel hungry, start by eating small amounts of foods that are soft and easy to digest (bland), such as toast. Gradually return to your regular diet.  Drink enough fluid to keep your urine pale yellow.  Return to your normal activities as told by your health care provider. Ask your health care provider what activities are safe for you. This information is not intended to replace advice given to you by your health care provider. Make sure you discuss any questions you have with your health care provider. Document Revised: 08/20/2017 Document Reviewed: 04/02/2017 Elsevier Patient Education  [place] Lymph Node Biopsy, Care After This sheet gives you information about how to care for yourself after your procedure. Your health care provider may also give you more specific instructions. If you have problems or questions, contact your health care provider. What can I expect after the procedure? After the procedure, it is common to have:  Bruising.  Soreness.  Mild swelling. Follow these instructions at home: Medicines  Take over-the-counter and prescription medicines only as told by your health care provider.  If you were prescribed an antibiotic medicine, take it as told by your health care provider. Do not stop taking the antibiotic even if you start to feel better. Incision care   Follow instructions from your health care provider about how to take care of your  incision. Make sure you: ? Wash your hands with soap and water before and after you change your bandage (dressing). If soap and water are not available, use hand sanitizer. ? Change your dressing as told by your health care provider. ? Leave stitches (sutures), skin glue, or adhesive strips in place. These skin closures may need to stay in place for 2 weeks or longer. If adhesive strip edges start to loosen and curl up, you may trim the loose edges. Do not remove adhesive strips completely unless your health care provider tells you to do that.  Check your incision area every day for signs of infection. Check for: ? More redness, swelling, or pain. ? Fluid or blood. ? Warmth. ? Pus or a bad smell. Driving  Do not drive for 24 hours if you were given a sedative during your procedure.  Do not drive or use heavy machinery while taking  prescription pain medicine. General instructions  Return to your normal activities as told by your health care provider. Ask your health care provider what activities are safe for you.  Do not take baths, swim, or use a hot tub until your health care provider approves. Ask your health care provider if you may take showers. You may only be allowed to take sponge baths.  Keep all follow-up visits as told by your health care provider. This is important. Contact a health care provider if:  You have more redness, swelling, or pain around your incision.  You have fluid or blood coming from your incision.  Your incision feels warm to the touch.  You have pus or a bad smell coming from your incision.  You have a fever.  You have pain or numbness that gets worse or lasts longer than a few days. Summary  After a lymph node biopsy, it is common to have bruising, soreness, and mild swelling.  Follow your health care provider's instructions about taking care of yourself at home. You will be told how to take medicines, take care of your incision, and check for  infection.  Return to your normal activities as told by your health care provider. Ask your health care provider what activities are safe for you.  Contact a health care provider if you have more redness, swelling, or pain around your incision, you have a fever, or you have worsening pain or numbness. This information is not intended to replace advice given to you by your health care provider. Make sure you discuss any questions you have with your health care provider. Document Revised: 03/24/2018 Document Reviewed: 03/24/2018 Elsevier Patient Education  [place]. How to Use Chlorhexidine for Bathing Chlorhexidine gluconate (CHG) is a germ-killing (antiseptic) solution that is used to clean the skin. It can get rid of the bacteria that normally live on the skin and can keep them away for about 24 hours. To clean your skin with CHG, you may be given:  A CHG solution to use in the shower or as part of a sponge bath.  A prepackaged cloth that contains CHG. Cleaning your skin with CHG may help lower the risk for infection:  While you are staying in the intensive care unit of the hospital.  If you have a vascular access, such as a central line, to provide short-term or long-term access to your veins.  If you have a catheter to drain urine from your bladder.  If you are on a ventilator. A ventilator is a machine that helps you breathe by moving air in and out of your lungs.  After surgery. What are the risks? Risks of using CHG include:  A skin reaction.  Hearing loss, if CHG gets in your ears.  Eye injury, if CHG gets in your eyes and is not rinsed out.  The CHG product catching fire. Make sure that you avoid smoking and flames after applying CHG to your skin. Do not use CHG:  If you have a chlorhexidine allergy or have previously reacted to chlorhexidine.  On babies younger than 68 months of age. How to use CHG solution  Use CHG only as told by your health care  provider, and follow the instructions on the label.  Use the full amount of CHG as directed. Usually, this is one bottle. During a shower Follow these steps when using CHG solution during a shower (unless your health care provider gives you different instructions): 1. Start the shower. 2.  Use your normal soap and shampoo to wash your face and hair. 3. Turn off the shower or move out of the shower stream. 4. Pour the CHG onto a clean washcloth. Do not use any type of brush or rough-edged sponge. 5. Starting at your neck, lather your body down to your toes. Make sure you follow these instructions: ? If you will be having surgery, pay special attention to the part of your body where you will be having surgery. Scrub this area for at least 1 minute. ? Do not use CHG on your head or face. If the solution gets into your ears or eyes, rinse them well with water. ? Avoid your genital area. ? Avoid any areas of skin that have broken skin, cuts, or scrapes. ? Scrub your back and under your arms. Make sure to wash skin folds. 6. Let the lather sit on your skin for 1-2 minutes or as long as told by your health care provider. 7. Thoroughly rinse your entire body in the shower. Make sure that all body creases and crevices are rinsed well. 8. Dry off with a clean towel. Do not put any substances on your body afterward--such as powder, lotion, or perfume--unless you are told to do so by your health care provider. Only use lotions that are recommended by the manufacturer. 9. Put on clean clothes or pajamas. 10. If it is the night before your surgery, sleep in clean sheets.  During a sponge bath Follow these steps when using CHG solution during a sponge bath (unless your health care provider gives you different instructions): 1. Use your normal soap and shampoo to wash your face and hair. 2. Pour the CHG onto a clean washcloth. 3. Starting at your neck, lather your body down to your toes. Make sure you follow  these instructions: ? If you will be having surgery, pay special attention to the part of your body where you will be having surgery. Scrub this area for at least 1 minute. ? Do not use CHG on your head or face. If the solution gets into your ears or eyes, rinse them well with water. ? Avoid your genital area. ? Avoid any areas of skin that have broken skin, cuts, or scrapes. ? Scrub your back and under your arms. Make sure to wash skin folds. 4. Let the lather sit on your skin for 1-2 minutes or as long as told by your health care provider. 5. Using a different clean, wet washcloth, thoroughly rinse your entire body. Make sure that all body creases and crevices are rinsed well. 6. Dry off with a clean towel. Do not put any substances on your body afterward--such as powder, lotion, or perfume--unless you are told to do so by your health care provider. Only use lotions that are recommended by the manufacturer. 7. Put on clean clothes or pajamas. 8. If it is the night before your surgery, sleep in clean sheets. How to use CHG prepackaged cloths  Only use CHG cloths as told by your health care provider, and follow the instructions on the label.  Use the CHG cloth on clean, dry skin.  Do not use the CHG cloth on your head or face unless your health care provider tells you to.  When washing with the CHG cloth: ? Avoid your genital area. ? Avoid any areas of skin that have broken skin, cuts, or scrapes. Before surgery Follow these steps when using a CHG cloth to clean before surgery (unless your health  care provider gives you different instructions): 1. Using the CHG cloth, vigorously scrub the part of your body where you will be having surgery. Scrub using a back-and-forth motion for 3 minutes. The area on your body should be completely wet with CHG when you are done scrubbing. 2. Do not rinse. Discard the cloth and let the area air-dry. Do not put any substances on the area afterward, such as  powder, lotion, or perfume. 3. Put on clean clothes or pajamas. 4. If it is the night before your surgery, sleep in clean sheets.  For general bathing Follow these steps when using CHG cloths for general bathing (unless your health care provider gives you different instructions). 1. Use a separate CHG cloth for each area of your body. Make sure you wash between any folds of skin and between your fingers and toes. Wash your body in the following order, switching to a new cloth after each step: ? The front of your neck, shoulders, and chest. ? Both of your arms, under your arms, and your hands. ? Your stomach and groin area, avoiding the genitals. ? Your right leg and foot. ? Your left leg and foot. ? The back of your neck, your back, and your buttocks. 2. Do not rinse. Discard the cloth and let the area air-dry. Do not put any substances on your body afterward--such as powder, lotion, or perfume--unless you are told to do so by your health care provider. Only use lotions that are recommended by the manufacturer. 3. Put on clean clothes or pajamas. Contact a health care provider if:  Your skin gets irritated after scrubbing.  You have questions about using your solution or cloth. Get help right away if:  Your eyes become very red or swollen.  Your eyes itch badly.  Your skin itches badly and is red or swollen.  Your hearing changes.  You have trouble seeing.  You have swelling or tingling in your mouth or throat.  You have trouble breathing.  You swallow any chlorhexidine. Summary  Chlorhexidine gluconate (CHG) is a germ-killing (antiseptic) solution that is used to clean the skin. Cleaning your skin with CHG may help to lower your risk for infection.  You may be given CHG to use for bathing. It may be in a bottle or in a prepackaged cloth to use on your skin. Carefully follow your health care provider's instructions and the instructions on the product label.  Do not use CHG  if you have a chlorhexidine allergy.  Contact your health care provider if your skin gets irritated after scrubbing. This information is not intended to replace advice given to you by your health care provider. Make sure you discuss any questions you have with your health care provider. Document Revised: 11/03/2018 Document Reviewed: 07/15/2017 Elsevier Patient Education  [place].

## 2020-06-18 LAB — SARS CORONAVIRUS 2 (TAT 6-24 HRS): SARS Coronavirus 2: NEGATIVE

## 2020-06-19 ENCOUNTER — Ambulatory Visit (HOSPITAL_COMMUNITY): Payer: Medicare Other | Admitting: Anesthesiology

## 2020-06-19 ENCOUNTER — Other Ambulatory Visit: Payer: Self-pay

## 2020-06-19 ENCOUNTER — Encounter (HOSPITAL_COMMUNITY)
Admission: RE | Disposition: A | Discharge status: Home / Self Care | Payer: Self-pay | Source: Home / Self Care | Attending: General Surgery

## 2020-06-19 ENCOUNTER — Encounter (HOSPITAL_COMMUNITY): Payer: Self-pay | Admitting: General Surgery

## 2020-06-19 ENCOUNTER — Ambulatory Visit (HOSPITAL_COMMUNITY)
Admission: RE | Admit: 2020-06-19 | Discharge: 2020-06-19 | Disposition: A | Discharge status: Home / Self Care | Payer: Medicare Other | Attending: General Surgery | Admitting: General Surgery

## 2020-06-19 DIAGNOSIS — M170 Bilateral primary osteoarthritis of knee: Secondary | ICD-10-CM | POA: Insufficient documentation

## 2020-06-19 DIAGNOSIS — Z803 Family history of malignant neoplasm of breast: Secondary | ICD-10-CM | POA: Diagnosis not present

## 2020-06-19 DIAGNOSIS — Z79899 Other long term (current) drug therapy: Secondary | ICD-10-CM | POA: Diagnosis not present

## 2020-06-19 DIAGNOSIS — Z7982 Long term (current) use of aspirin: Secondary | ICD-10-CM | POA: Insufficient documentation

## 2020-06-19 DIAGNOSIS — Z791 Long term (current) use of non-steroidal anti-inflammatories (NSAID): Secondary | ICD-10-CM | POA: Diagnosis not present

## 2020-06-19 DIAGNOSIS — R590 Localized enlarged lymph nodes: Secondary | ICD-10-CM | POA: Diagnosis not present

## 2020-06-19 DIAGNOSIS — I10 Essential (primary) hypertension: Secondary | ICD-10-CM | POA: Diagnosis not present

## 2020-06-19 DIAGNOSIS — Z87891 Personal history of nicotine dependence: Secondary | ICD-10-CM | POA: Insufficient documentation

## 2020-06-19 HISTORY — PX: INGUINAL LYMPH NODE BIOPSY: SHX5865

## 2020-06-19 SURGERY — BIOPSY, LYMPH NODE, INGUINAL, OPEN
Anesthesia: General | Laterality: Right

## 2020-06-19 MED ORDER — ONDANSETRON HCL 4 MG/2ML IJ SOLN: 4.0000 mg | Freq: Once | INTRAMUSCULAR | Status: DC | PRN

## 2020-06-19 MED ORDER — LIDOCAINE HCL (CARDIAC) PF 50 MG/5ML IV SOSY
PREFILLED_SYRINGE | INTRAVENOUS | Status: DC | PRN
  Administered 2020-06-19: 60 mg via INTRAVENOUS

## 2020-06-19 MED ORDER — BUPIVACAINE HCL (PF) 0.5 % IJ SOLN
INTRAMUSCULAR | Status: DC | PRN
  Administered 2020-06-19: 10 mL

## 2020-06-19 MED ORDER — FENTANYL CITRATE (PF) 100 MCG/2ML IJ SOLN
INTRAMUSCULAR | Status: DC | PRN
  Administered 2020-06-19 (×3): 50 ug via INTRAVENOUS

## 2020-06-19 MED ORDER — LACTATED RINGERS IV SOLN: INTRAVENOUS | Status: DC

## 2020-06-19 MED ORDER — PROPOFOL 10 MG/ML IV BOLUS
INTRAVENOUS | Status: AC
  Filled 2020-06-19: qty 20

## 2020-06-19 MED ORDER — CHLORHEXIDINE GLUCONATE CLOTH 2 % EX PADS: 6.0000 | MEDICATED_PAD | Freq: Once | CUTANEOUS | Status: DC

## 2020-06-19 MED ORDER — MIDAZOLAM HCL 2 MG/2ML IJ SOLN
INTRAMUSCULAR | Status: AC
  Filled 2020-06-19: qty 2

## 2020-06-19 MED ORDER — 0.9 % SODIUM CHLORIDE (POUR BTL) OPTIME
TOPICAL | Status: DC | PRN
  Administered 2020-06-19: 1000 mL

## 2020-06-19 MED ORDER — CHLORHEXIDINE GLUCONATE 0.12 % MT SOLN: 15.0000 mL | Freq: Once | OROMUCOSAL | Status: DC

## 2020-06-19 MED ORDER — EPHEDRINE SULFATE 50 MG/ML IJ SOLN
INTRAMUSCULAR | Status: DC | PRN
  Administered 2020-06-19: 10 mg via INTRAVENOUS
  Administered 2020-06-19: 5 mg via INTRAVENOUS

## 2020-06-19 MED ORDER — FENTANYL CITRATE (PF) 100 MCG/2ML IJ SOLN: 25.0000 ug | INTRAMUSCULAR | Status: DC | PRN

## 2020-06-19 MED ORDER — ONDANSETRON HCL 4 MG PO TABS: 4.0000 mg | ORAL_TABLET | Freq: Three times a day (TID) | ORAL | 0 refills | Status: AC | PRN

## 2020-06-19 MED ORDER — OXYCODONE HCL 5 MG PO TABS
5.0000 mg | ORAL_TABLET | ORAL | 0 refills | Status: AC | PRN
Start: 2020-06-19 — End: 2021-06-19

## 2020-06-19 MED ORDER — LIDOCAINE 2% (20 MG/ML) 5 ML SYRINGE
INTRAMUSCULAR | Status: AC
  Filled 2020-06-19: qty 5

## 2020-06-19 MED ORDER — DOCUSATE SODIUM 100 MG PO CAPS: 100.0000 mg | ORAL_CAPSULE | Freq: Two times a day (BID) | ORAL | 0 refills | Status: AC | PRN

## 2020-06-19 MED ORDER — PROPOFOL 10 MG/ML IV BOLUS
INTRAVENOUS | Status: DC | PRN
  Administered 2020-06-19: 200 mg via INTRAVENOUS

## 2020-06-19 MED ORDER — FENTANYL CITRATE (PF) 250 MCG/5ML IJ SOLN
INTRAMUSCULAR | Status: AC
  Filled 2020-06-19: qty 5

## 2020-06-19 MED ORDER — MIDAZOLAM HCL 5 MG/5ML IJ SOLN
INTRAMUSCULAR | Status: DC | PRN
  Administered 2020-06-19: 2 mg via INTRAVENOUS

## 2020-06-19 MED ORDER — LACTATED RINGERS IV SOLN: INTRAVENOUS | Status: DC | PRN

## 2020-06-19 MED ORDER — CEFAZOLIN SODIUM-DEXTROSE 2-4 GM/100ML-% IV SOLN
2.0000 g | INTRAVENOUS | Status: AC
  Administered 2020-06-19: 2 g via INTRAVENOUS
  Filled 2020-06-19: qty 100

## 2020-06-19 MED ORDER — ONDANSETRON HCL 4 MG/2ML IJ SOLN
INTRAMUSCULAR | Status: AC
  Filled 2020-06-19: qty 2

## 2020-06-19 MED ORDER — BUPIVACAINE HCL (PF) 0.5 % IJ SOLN
INTRAMUSCULAR | Status: AC
  Filled 2020-06-19: qty 30

## 2020-06-19 MED ORDER — ONDANSETRON HCL 4 MG/2ML IJ SOLN
INTRAMUSCULAR | Status: DC | PRN
  Administered 2020-06-19: 4 mg via INTRAVENOUS

## 2020-06-19 MED ORDER — ORAL CARE MOUTH RINSE: 15.0000 mL | Freq: Once | OROMUCOSAL | Status: DC

## 2020-06-19 SURGICAL SUPPLY — 43 items
ADH SKN CLS APL DERMABOND .7 (GAUZE/BANDAGES/DRESSINGS) ×1
APPLIER CLIP 9.375 SM OPEN (CLIP) ×12
APR CLP SM 9.3 20 MLT OPN (CLIP) ×4
CLIP APPLIE 9.375 SM OPEN (CLIP) ×4 IMPLANT
CLOTH BEACON ORANGE TIMEOUT ST (SAFETY) ×3 IMPLANT
CONT SPEC 4OZ CLIKSEAL STRL BL (MISCELLANEOUS) ×3 IMPLANT
COVER LIGHT HANDLE STERIS (MISCELLANEOUS) ×6 IMPLANT
COVER WAND RF STERILE (DRAPES) ×3 IMPLANT
DECANTER SPIKE VIAL GLASS SM (MISCELLANEOUS) ×3 IMPLANT
DERMABOND ADVANCED (GAUZE/BANDAGES/DRESSINGS) ×2
DERMABOND ADVANCED .7 DNX12 (GAUZE/BANDAGES/DRESSINGS) ×1 IMPLANT
ELECT REM PT RETURN 9FT ADLT (ELECTROSURGICAL) ×3
ELECTRODE REM PT RTRN 9FT ADLT (ELECTROSURGICAL) ×1 IMPLANT
GAUZE 4X4 16PLY RFD (DISPOSABLE) ×3 IMPLANT
GAUZE SPONGE 4X4 12PLY STRL (GAUZE/BANDAGES/DRESSINGS) ×3 IMPLANT
GLOVE BIO SURGEON STRL SZ 6.5 (GLOVE) ×2 IMPLANT
GLOVE BIO SURGEONS STRL SZ 6.5 (GLOVE) ×1
GLOVE BIOGEL PI IND STRL 6.5 (GLOVE) ×1 IMPLANT
GLOVE BIOGEL PI IND STRL 7.0 (GLOVE) ×2 IMPLANT
GLOVE BIOGEL PI IND STRL 7.5 (GLOVE) ×1 IMPLANT
GLOVE BIOGEL PI INDICATOR 6.5 (GLOVE) ×2
GLOVE BIOGEL PI INDICATOR 7.0 (GLOVE) ×4
GLOVE BIOGEL PI INDICATOR 7.5 (GLOVE) ×2
GLOVE SURG SS PI 7.0 STRL IVOR (GLOVE) ×3 IMPLANT
GOWN STRL REUS W/TWL LRG LVL3 (GOWN DISPOSABLE) ×6 IMPLANT
KIT TURNOVER KIT A (KITS) ×3 IMPLANT
MANIFOLD NEPTUNE II (INSTRUMENTS) ×3 IMPLANT
NEEDLE HYPO 25X1 1.5 SAFETY (NEEDLE) ×3 IMPLANT
NS IRRIG 1000ML POUR BTL (IV SOLUTION) ×3 IMPLANT
PACK MINOR (CUSTOM PROCEDURE TRAY) ×3 IMPLANT
PAD ARMBOARD 7.5X6 YLW CONV (MISCELLANEOUS) ×3 IMPLANT
PAD TELFA 3X4 1S STER (GAUZE/BANDAGES/DRESSINGS) IMPLANT
SET BASIN LINEN APH (SET/KITS/TRAYS/PACK) ×3 IMPLANT
SHEARS HARMONIC 9CM CVD (BLADE) ×3 IMPLANT
SPONGE INTESTINAL PEANUT (DISPOSABLE) IMPLANT
SUT MNCRL AB 4-0 PS2 18 (SUTURE) ×6 IMPLANT
SUT SILK 2 0 (SUTURE)
SUT SILK 2-0 18XBRD TIE 12 (SUTURE) IMPLANT
SUT VIC AB 3-0 SH 27 (SUTURE) ×6
SUT VIC AB 3-0 SH 27X BRD (SUTURE) ×2 IMPLANT
SUT VICRYL AB 3 0 TIES (SUTURE) ×3 IMPLANT
SYR BULB IRRIG 60ML STRL (SYRINGE) ×3 IMPLANT
SYR CONTROL 10ML LL (SYRINGE) ×3 IMPLANT

## 2020-06-19 NOTE — Anesthesia Postprocedure Evaluation (Signed)
Anesthesia Post Note  Patient: NAJMA BOZARTH  Procedure(s) Performed: RIGHT INGUINAL LYMPH NODE EXCISIONAL BIOPSY (Right )  Patient location during evaluation: PACU Anesthesia Type: General Level of consciousness: awake, oriented and awake and alert Pain management: pain level controlled Vital Signs Assessment: post-procedure vital signs reviewed and stable Respiratory status: spontaneous breathing, nonlabored ventilation and respiratory function stable Cardiovascular status: blood pressure returned to baseline and stable Postop Assessment: no headache and no backache Anesthetic complications: no   No complications documented.   Last Vitals:  Vitals:   06/19/20 0932 06/19/20 1140  BP: (!) 186/106 (!) (P) 162/91  Pulse: 65 (P) 79  Resp: 18 (P) 18  Temp:  (!) (P) 36.4 C  SpO2: 98% (P) 95%    Last Pain:  Vitals:   06/19/20 0928  TempSrc: Oral  PainSc: 3                  Taleyah Hillman

## 2020-06-19 NOTE — Op Note (Signed)
[facility] Operative Note  06/19/20  Preoperative Diagnosis: Right inguinal adenopathy    Postoperative Diagnosis: Same   Procedure(s) Performed: Right inguinal excisional lymph node biopsy    Surgeon: Ria Comment C. Constance Haw, MD   Assistants: No qualified resident was available    Anesthesia: General endotracheal   Anesthesiologist: Louann Sjogren, MD    Specimens: Lymph nodes    Estimated Blood Loss: Minimal   Blood Replacement: None    Complications: None   Wound Class: Clean    Operative Indications: Yesenia Montgomery is a 64 yo with inguinal adenopathy on the right that was hypermetabolic on PET scan. We discussed excisional biopsy and the risk of bleeding, infection, lymph leak, need for additional procedures, finding cancer or nondiagnostic results. She opted to proceed.   Findings: Large lymph node in the medial inguinal/ right suprapubic region    Procedure: The patient was taken to the operating room and placed supine. General endotracheal anesthesia was induced. Intravenous antibiotics were administered per protocol.  The right groin was prepared and draped in the usual sterile fashion.   An incision was made over the palpable node and carried down through to the subcutaneous tissue. This was medial in the inguinal region and above the crease. Care was take as lymph nodes were encountered and these were excised with sharp and blunt dissection, clipping the blood vessels and lymph channels going to the nodes. There were two smaller nodes running in a chain to the larger node that was over 3 cm in size. This was deep and somewhat matted to the deep tissue. With great care this was excised again with blunt and sharp dissection, clipping the tissue around the node in order to ensure no bleeding or lymph leak.  The nodes were removed and sent to pathology fresh. The cavity was irrigated and made hemostatic. The deep cavity was closed with interrupted 3-0 Vicryl suture  and the skin was closed with a subcuticular running 4-0 Monocryl. Dermabond was placed.   Final inspection revealed acceptable hemostasis. All counts were correct at the end of the case. The patient was awakened from anesthesia and extubated without complication.  The patient went to the PACU in stable condition.   [name], MD [facility] [address] [place], [place] [address] [phone] (office)

## 2020-06-19 NOTE — Anesthesia Procedure Notes (Signed)
Procedure Name: LMA Insertion Date/Time: 06/19/2020 10:09 AM Performed by: Ollen Bowl, CRNA Pre-anesthesia Checklist: Patient identified, Patient being monitored, Emergency Drugs available, Timeout performed and Suction available Patient Re-evaluated:Patient Re-evaluated prior to induction Oxygen Delivery Method: Circle System Utilized Preoxygenation: Pre-oxygenation with 100% oxygen Induction Type: IV induction Ventilation: Mask ventilation without difficulty LMA: LMA inserted LMA Size: 4.0 Number of attempts: 1 Placement Confirmation: positive ETCO2 and breath sounds checked- equal and bilateral

## 2020-06-19 NOTE — Progress Notes (Signed)
[facility]  Spoke with husband, notified surgery complete. Pain medication to [place]. Ok to shower but not submerge.  Will see in the office 11/2 and will call with results of biopsy.  [name], MD [facility] [address] [place], [place] [address] [phone] (office)

## 2020-06-19 NOTE — Anesthesia Preprocedure Evaluation (Signed)
Anesthesia Evaluation  Patient identified by MRN, date of birth, ID band Patient awake    Reviewed: Allergy & Precautions, H&P , NPO status , Patient's Chart, lab work & pertinent test results, reviewed documented beta blocker date and time   Airway Mallampati: II  TM Distance: >3 FB Neck ROM: full    Dental no notable dental hx. (+) Teeth Intact   Pulmonary neg pulmonary ROS, former smoker,    Pulmonary exam normal breath sounds clear to auscultation       Cardiovascular Exercise Tolerance: Good hypertension, negative cardio ROS   Rhythm:regular Rate:Normal     Neuro/Psych negative neurological ROS  negative psych ROS   GI/Hepatic negative GI ROS, Neg liver ROS,   Endo/Other  negative endocrine ROS  Renal/GU negative Renal ROS  negative genitourinary   Musculoskeletal negative musculoskeletal ROS (+)   Abdominal   Peds negative pediatric ROS (+)  Hematology negative hematology ROS (+)   Anesthesia Other Findings   Reproductive/Obstetrics negative OB ROS                             Anesthesia Physical Anesthesia Plan  ASA: III  Anesthesia Plan: General   Post-op Pain Management:    Induction:   PONV Risk Score and Plan: Ondansetron  Airway Management Planned:   Additional Equipment:   Intra-op Plan:   Post-operative Plan:   Informed Consent: I have reviewed the patients History and Physical, chart, labs and discussed the procedure including the risks, benefits and alternatives for the proposed anesthesia with the patient or authorized representative who has indicated his/her understanding and acceptance.     Dental Advisory Given  Plan Discussed with: CRNA  Anesthesia Plan Comments:         Anesthesia Quick Evaluation

## 2020-06-19 NOTE — Transfer of Care (Signed)
Immediate Anesthesia Transfer of Care Note  Patient: Yesenia Montgomery  Procedure(s) Performed: RIGHT INGUINAL LYMPH NODE EXCISIONAL BIOPSY (Right )  Patient Location: PACU  Anesthesia Type:General  Level of Consciousness: awake, alert , oriented and patient cooperative  Airway & Oxygen Therapy: Patient Spontanous Breathing  Post-op Assessment: Report given to RN, Post -op Vital signs reviewed and stable and Patient moving all extremities  Post vital signs: Reviewed and stable  Last Vitals:  Vitals Value Taken Time  BP 167/94 06/19/20 1145  Temp    Pulse    Resp 18 06/19/20 1146  SpO2    Vitals shown include unvalidated device data.  Last Pain:  Vitals:   06/19/20 0928  TempSrc: Oral  PainSc: 3       Patients Stated Pain Goal: 8 (33/83/29 1916)  Complications: No complications documented.

## 2020-06-19 NOTE — Interval H&P Note (Signed)
History and Physical Interval Note:  06/19/2020 9:44 AM  Yesenia Montgomery  has presented today for surgery, with the diagnosis of lymphadenopathy.  The various methods of treatment have been discussed with the patient and family. After consideration of risks, benefits and other options for treatment, the patient has consented to  Procedure(s): INGUINAL LYMPH NODE BIOPSY (Right) as a surgical intervention.  The patient's history has been reviewed, patient examined, no change in status, stable for surgery.  I have reviewed the patient's chart and labs.  Questions were answered to the patient's satisfaction.    Marked right side. Still with pain in the right shoulder from rotator cuff tear and left side and leg that has been chronic.  Virl Cagey

## 2020-06-19 NOTE — Discharge Instructions (Signed)
Discharge Instructions:  Common Complaints: Pain and bruising at the incision site is common.  Some nausea is common and poor appetite after anesthesia. The main goal is to stay hydrated the first few days after surgery.   Diet/ Activity: Diet as tolerated. You may not have an appetite, but it is important to stay hydrated. Drink 64 ounces of water a day. Your appetite will return with time.  Shower per your regular routine daily.  Do not take hot showers. Take warm showers that are less than 10 minutes. Walk everyday for at least 15-20 minutes. Deep cough and move around every 1-2 hours in the first few days after surgery.  Limit excessive movement, lifting > 10 lbs, stretching with the limb if there is an incision on your arm/armpit or leg.   Limit stretching, pulling on your incision if it is located on other parts of your body.  Do not pick at the dermabond glue on your incision sites.  This glue film will remain in place for 1-2 weeks and will start to peel off.  Keep the area clean and dry with a dry bandage.  Do not place lotions or balms on your incision unless instructed to specifically by Dr. Constance Haw.   Medication: Take tylenol and ibuprofen as needed for pain control, alternating every 4-6 hours.  Example:  Tylenol 1000mg  @ 6am, 12noon, 6pm, 54midnight (Do not exceed 4000mg  of tylenol a day). Ibuprofen 800mg  @ 9am, 3pm, 9pm, 3am (Do not exceed 3600mg  of ibuprofen a day).  Take Roxicodone for breakthrough pain every 4 hours.  Take Colace for constipation related to narcotic pain medication. If you do not have a bowel movement in 2 days, take Miralax over the counter.  Drink plenty of water to also prevent constipation.   Contact Information: If you have questions or concerns, please call our office, [phone], Monday- Thursday 8AM-5PM and Friday 8AM-12Noon.  If it is after hours or on the weekend, please call [facility] Main Number, [phone], and ask to speak to the surgeon on  call for Dr. Constance Haw at [facility].   Open Lymph Node Biopsy, Care After This sheet gives you information about how to care for yourself after your procedure. Your health care provider may also give you more specific instructions. If you have problems or questions, contact your health care provider. What can I expect after the procedure? After the procedure, it is common to have:  Bruising.  Soreness.  Mild swelling. Follow these instructions at home: Medicines  Take over-the-counter and prescription medicines only as told by your health care provider.  If you were prescribed an antibiotic medicine, take it as told by your health care provider. Do not stop taking the antibiotic even if you start to feel better. Incision care   Follow instructions from your health care provider about how to take care of your incision. Make sure you: ? Wash your hands with soap and water before and after you change your bandage (dressing). If soap and water are not available, use hand sanitizer. ? Change your dressing as told by your health care provider. ? Leave stitches (sutures), skin glue, or adhesive strips in place. These skin closures may need to stay in place for 2 weeks or longer. If adhesive strip edges start to loosen and curl up, you may trim the loose edges. Do not remove adhesive strips completely unless your health care provider tells you to do that.  Check your incision area every day for signs of infection.  Check for: ? More redness, swelling, or pain. ? Fluid or blood. ? Warmth. ? Pus or a bad smell. Driving  Do not drive for 24 hours if you were given a sedative during your procedure.  Do not drive or use heavy machinery while taking prescription pain medicine. General instructions  Return to your normal activities as told by your health care provider. Ask your health care provider what activities are safe for you.  Do not take baths, swim, or use a hot tub until your health  care provider approves.   You may shower.  Do not submerge.  Keep all follow-up visits as told by your health care provider. This is important. Contact a health care provider if:  You have more redness, swelling, or pain around your incision.  You have fluid or blood coming from your incision.  Your incision feels warm to the touch.  You have pus or a bad smell coming from your incision.  You have a fever.  You have pain or numbness that gets worse or lasts longer than a few days. Summary  After a lymph node biopsy, it is common to have bruising, soreness, and mild swelling.  Follow your health care provider's instructions about taking care of yourself at home. You will be told how to take medicines, take care of your incision, and check for infection.  Return to your normal activities as told by your health care provider. Ask your health care provider what activities are safe for you.  Contact a health care provider if you have more redness, swelling, or pain around your incision, you have a fever, or you have worsening pain or numbness. This information is not intended to replace advice given to you by your health care provider. Make sure you discuss any questions you have with your health care provider. Document Revised: 03/24/2018 Document Reviewed: 03/24/2018 Elsevier Patient Education  [place].

## 2020-06-20 ENCOUNTER — Encounter (HOSPITAL_COMMUNITY): Payer: Self-pay | Admitting: General Surgery

## 2020-06-26 LAB — SURGICAL PATHOLOGY

## 2020-06-27 ENCOUNTER — Inpatient Hospital Stay (HOSPITAL_BASED_OUTPATIENT_CLINIC_OR_DEPARTMENT_OTHER): Payer: Medicare Other | Admitting: Hematology

## 2020-06-27 ENCOUNTER — Telehealth (INDEPENDENT_AMBULATORY_CARE_PROVIDER_SITE_OTHER): Payer: Medicare Other | Admitting: General Surgery

## 2020-06-27 ENCOUNTER — Other Ambulatory Visit: Payer: Self-pay

## 2020-06-27 VITALS — BP 200/93 | Temp 97.0°F | Resp 20 | Wt 250.2 lb

## 2020-06-27 DIAGNOSIS — R590 Localized enlarged lymph nodes: Secondary | ICD-10-CM | POA: Diagnosis not present

## 2020-06-27 DIAGNOSIS — Z87891 Personal history of nicotine dependence: Secondary | ICD-10-CM | POA: Diagnosis not present

## 2020-06-27 DIAGNOSIS — I10 Essential (primary) hypertension: Secondary | ICD-10-CM

## 2020-06-27 MED ORDER — CLONIDINE HCL 0.1 MG PO TABS
0.2000 mg | ORAL_TABLET | Freq: Once | ORAL | Status: AC
  Administered 2020-06-27: 0.2 mg via ORAL
  Filled 2020-06-27: qty 2

## 2020-06-27 MED ORDER — AMLODIPINE BESYLATE 5 MG PO TABS: 5.0000 mg | ORAL_TABLET | Freq: Every day | ORAL | 6 refills | Status: DC

## 2020-06-27 NOTE — Telephone Encounter (Signed)
[facility]  No answer. Left message. Lymph node reactive. Will let Dr. Delton Coombes know.   FINAL MICROSCOPIC DIAGNOSIS:   A. LYMPH NODE, RIGHT INGUINAL, BIOPSY:  - Reactive lymphoid hyperplasia, see comment.   COMMENT:   Sections of lymph node reveal follicular and paracortical hyperplasia.  Follicles are variably sized with reactive appearing germinal centers.  Paracortical areas consist of small lymphocytes without significant  atypia. There are scattered epithelioid histiocytes (CD68).  Immunohistochemistry highlights the reactive germinal centers (CD20,  bcl-6, CD10-positive; bcl-2 negative) with discrete follicular dendritic  networks (CD23). Interfollicular and paracortical areas consist of  T-cells (CD3, CD5, CD4>CD8, CD43). There are scattered activated  lymphocytes seen with CD30. CD34 highlights increased vascularity. EBV  in situ hybridization is negative. HHV8 is negative. Flow cytometry  ([id number]) is negative. Overall the findings are consistent with  reactive lymphoid hyperplasia. The findings are non-specific and the  differential includes infection (toxoplasmosis, cat scratch, etc) and  reactive conditions. Dr. Gari Crown has reviewed the case.   [name], MD [facility] [address] [place], [place] [address] [phone] (office)

## 2020-06-27 NOTE — Patient Instructions (Addendum)
[place] at [facility] Discharge Instructions  You were seen today by Dr. Delton Coombes. He went over your recent results; your biopsy results are negative for cancer or lymphoma. You will be prescribed amlodipine 5 mg to take daily for your elevated blood pressure; call Dr. Nevada Crane and see him in 1 month, or sooner if possible. Dr. Delton Coombes will see you back in 6 months for labs and follow up.   Thank you for choosing [place] at [facility] to provide your oncology and hematology care.  To afford each patient quality time with our provider, please arrive at least 15 minutes before your scheduled appointment time.   If you have a lab appointment with the [facility] please come in thru the Main Entrance and check in at the main information desk  You need to re-schedule your appointment should you arrive 10 or more minutes late.  We strive to give you quality time with our providers, and arriving late affects you and other patients whose appointments are after yours.  Also, if you no show three or more times for appointments you may be dismissed from the clinic at the providers discretion.     Again, thank you for choosing [facility].  Our hope is that these requests will decrease the amount of time that you wait before being seen by our physicians.       _____________________________________________________________  Should you have questions after your visit to [facility], please contact our office at [phone] between the hours of 8:00 a.m. and 4:30 p.m.  Voicemails left after 4:00 p.m. will not be returned until the following business day.  For prescription refill requests, have your pharmacy contact our office and allow 72 hours.    Cancer Center Support Programs:   > Cancer Support Group  2nd Tuesday of the month 1pm-2pm, Journey Room

## 2020-06-27 NOTE — Progress Notes (Signed)
Yesenia Montgomery, [place] [address]   CLINIC:  Medical Oncology/Hematology  PCP:  Celene Squibb, MD [address] [place] [address]  [phone]  REASON FOR VISIT:  Follow-up for right inguinal lymphadenopathy  PRIOR THERAPY: None  CURRENT THERAPY: Observation  INTERVAL HISTORY:  Yesenia Montgomery, a 65 y.o. female, returns for routine follow-up for her right inguinal lymphadenopathy. Yesenia Montgomery was last seen on 06/13/2020.   Today she is accompanied by her husband and reports feeling nervous. She had a right lymph node excisional biopsy on 10/20; she goes back to see Dr. Constance Haw on 11/3. She complains of left hip pain which hurts when she walks. She sees Dr. Aline Brochure for her left knee pain and right shoulder rotator cuff tear. She checks her BP at home and denies lightheadedness, though she reports having occasional headaches; her BP this morning was 181/98.   REVIEW OF SYSTEMS:  Review of Systems  Constitutional: Negative for appetite change and fatigue.  Musculoskeletal: Positive for back pain (7/10 L flank, shoulder and legs pain).  Neurological: Positive for headaches (occasional d/t HTN). Negative for light-headedness.  All other systems reviewed and are negative.   PAST MEDICAL/SURGICAL HISTORY:  Past Medical History:  Diagnosis Date   Arthritis    knees   Chronic pain    HLD (hyperlipidemia) 03/30/2017   Hypertension    Past Surgical History:  Procedure Laterality Date   ABDOMINAL HYSTERECTOMY N/A 10/27/2017   Procedure: TOTAL ABDOMINAL HYSTERECTOMY;  Surgeon: Florian Buff, MD;  Location: [facility];  Service: Gynecology;  Laterality: N/A;   BREAST BIOPSY Left    foot surgery Right    INGUINAL LYMPH NODE BIOPSY Right 06/19/2020   Procedure: RIGHT INGUINAL LYMPH NODE EXCISIONAL BIOPSY;  Surgeon: Virl Cagey, MD;  Location: [facility];  Service: General;  Laterality: Right;   SALPINGOOPHORECTOMY Bilateral 10/27/2017     Procedure: BILATERAL SALPINGO OOPHORECTOMY;  Surgeon: Florian Buff, MD;  Location: [facility];  Service: Gynecology;  Laterality: Bilateral;   TONSILLECTOMY      SOCIAL HISTORY:  Social History   Socioeconomic History   Marital status: Married    Spouse name: Not on file   Number of children: 2   Years of education: 12   Highest education level: Not on file  Occupational History   Occupation: retired/disabled    Comment: board of [employer]  Tobacco Use   Smoking status: Former Smoker    Packs/day: 0.25    Years: 5.00    Pack years: 1.25    Types: Cigarettes    Quit date: 10/23/1983    Years since quitting: 36.7   Smokeless tobacco: Never Used  Vaping Use   Vaping Use: Never used  Substance and Sexual Activity   Alcohol use: Yes    Comment: occasional glass of wine   Drug use: No   Sexual activity: Not Currently    Birth control/protection: Post-menopausal    Comment: widow  Other Topics Concern   Not on file  Social History Narrative   Lives alone   Children in [place]   Has lived in [place] since 2009 - husband was a Set designer to the [employer], swims   reads at the [employer] Determinants of Health   Financial Resource Strain:    Difficulty of Paying Living Expenses: Not on file  Food Insecurity:    Worried About Charity fundraiser in the  Last Year: Not on file   Ran Out of Food in the Last Year: Not on file  Transportation Needs:    Lack of Transportation (Medical): Not on file   Lack of Transportation (Non-Medical): Not on file  Physical Activity:    Days of Exercise per Week: Not on file   Minutes of Exercise per Session: Not on file  Stress:    Feeling of Stress : Not on file  Social Connections:    Frequency of Communication with Friends and Family: Not on file   Frequency of Social Gatherings with Friends and Family: Not on file   Attends Religious Services: Not on file   Active Member of Clubs or Organizations:  Not on file   Attends Archivist Meetings: Not on file   Marital Status: Not on file  Intimate Partner Violence:    Fear of Current or Ex-Partner: Not on file   Emotionally Abused: Not on file   Physically Abused: Not on file   Sexually Abused: Not on file    FAMILY HISTORY:  Family History  Problem Relation Age of Onset   Stroke Mother    Heart disease Mother    Hypertension Mother    Hyperlipidemia Mother    Heart attack Mother    Early death Father        bedridden by age of 63 ?   Alcohol abuse Father    Cancer Paternal Aunt    Diabetes Cousin    Breast cancer Maternal Aunt    Breast cancer Maternal Uncle     CURRENT MEDICATIONS:  Current Outpatient Medications  Medication Sig Dispense Refill   aspirin EC 81 MG tablet Take 81 mg by mouth daily. Swallow whole.     atenolol (TENORMIN) 50 MG tablet Take 50 mg by mouth daily.     docusate sodium (COLACE) 100 MG capsule Take 1 capsule (100 mg total) by mouth 2 (two) times daily as needed for mild constipation (while taking narcotics). 60 capsule 0   levocetirizine (XYZAL) 5 MG tablet Take 5 mg by mouth daily before breakfast.      meloxicam (MOBIC) 7.5 MG tablet Take 1 tablet (7.5 mg total) by mouth daily. 30 tablet 5   montelukast (SINGULAIR) 10 MG tablet Take 10 mg by mouth at bedtime.     OVER THE COUNTER MEDICATION Take 1 capsule by mouth 2 (two) times daily with a meal. [employer]mogenics weight loss supplement     oxyCODONE (ROXICODONE) 5 MG immediate release tablet Take 1 tablet (5 mg total) by mouth every 4 (four) hours as needed for severe pain. 5 tablet 0   Potassium 99 MG TABS Take 99 mg by mouth daily.     VITAMIN A PO Take 1 tablet by mouth daily.     amLODipine (NORVASC) 5 MG tablet Take 1 tablet (5 mg total) by mouth daily. 30 tablet 6   ondansetron (ZOFRAN) 4 MG tablet Take 1 tablet (4 mg total) by mouth every 8 (eight) hours as needed for nausea or vomiting. (Patient not  taking: Reported on 06/27/2020) 30 tablet 0   No current facility-administered medications for this visit.    ALLERGIES:  Allergies  Allergen Reactions   Latex Rash    Waist trainer belt    PHYSICAL EXAM:  Performance status (ECOG): 1 - Symptomatic but completely ambulatory  Vitals:   06/27/20 1028  BP: (!) 200/93  Resp: 20  Temp: (!) 97 F (36.1 C)  SpO2: 100%  Wt Readings from Last 3 Encounters:  06/27/20 250 lb 3.2 oz (113.5 kg)  06/19/20 251 lb 15.8 oz (114.3 kg)  06/13/20 252 lb (114.3 kg)   Physical Exam Vitals reviewed.  Constitutional:      Appearance: Normal appearance. She is obese.  Neurological:     General: No focal deficit present.     Mental Status: She is alert and oriented to person, place, and time.  Psychiatric:        Mood and Affect: Mood normal.        Behavior: Behavior normal.     LABORATORY DATA:  I have reviewed the labs as listed.  CBC Latest Ref Rng & Units 06/04/2020 10/28/2017 10/22/2017  WBC 4.0 - 10.5 K/uL 5.9 5.9 5.0  Hemoglobin 12.0 - 15.0 g/dL 13.1 11.0(L) 13.3  Hematocrit 36 - 46 % 41.6 34.7(L) 42.5  Platelets 150 - 400 K/uL 269 229 262   CMP Latest Ref Rng & Units 06/04/2020 10/28/2017 10/22/2017  Glucose 70 - 99 mg/dL 110(H) 99 105(H)  BUN 8 - 23 mg/dL 16 17 18   Creatinine 0.44 - 1.00 mg/dL 0.82 0.91 0.90  Sodium 135 - 145 mmol/L 138 138 137  Potassium 3.5 - 5.1 mmol/L 3.8 3.8 3.6  Chloride 98 - 111 mmol/L 100 102 98(L)  CO2 22 - 32 mmol/L 29 29 29   Calcium 8.9 - 10.3 mg/dL 10.0 9.0 9.6  Total Protein 6.5 - 8.1 g/dL 8.1 - 8.0  Total Bilirubin 0.3 - 1.2 mg/dL 0.7 - 0.6  Alkaline Phos 38 - 126 U/L 55 - 66  AST 15 - 41 U/L 17 - 28  ALT 0 - 44 U/L 15 - 26      Component Value Date/Time   RBC 4.46 06/04/2020 1435   MCV 93.3 06/04/2020 1435   MCH 29.4 06/04/2020 1435   MCHC 31.5 06/04/2020 1435   RDW 13.4 06/04/2020 1435   LYMPHSABS 1.5 06/04/2020 1435   MONOABS 0.3 06/04/2020 1435   EOSABS 0.2 06/04/2020 1435    BASOSABS 0.0 06/04/2020 1435    DIAGNOSTIC IMAGING:  I have independently reviewed the scans and discussed with the patient. CT ABDOMEN PELVIS WO CONTRAST  Result Date: 05/30/2020 CLINICAL DATA:  Left flank pain, microscopic hematuria. EXAM: CT ABDOMEN AND PELVIS WITHOUT CONTRAST TECHNIQUE: Multidetector CT imaging of the abdomen and pelvis was performed following the standard protocol without IV contrast. COMPARISON:  None. FINDINGS: Lower chest: No acute abnormality. Hepatobiliary: No focal liver abnormality is seen. No gallstones, gallbladder wall thickening, or biliary dilatation. Pancreas: Unremarkable. No pancreatic ductal dilatation or surrounding inflammatory changes. Spleen: Normal in size without focal abnormality. Adrenals/Urinary Tract: Adrenal glands appear normal. No hydronephrosis or renal obstruction is noted. No renal or ureteral calculi are noted. Probable angiomyolipoma measuring 5.8 x 3.3 cm is seen arising from lower pole of left kidney. Urinary bladder is unremarkable. Stomach/Bowel: Stomach is within normal limits. Appendix appears normal. No evidence of bowel wall thickening, distention, or inflammatory changes. Vascular/Lymphatic: No significant vascular abnormality is noted. 3.2 x 2.0 cm right inguinal lymph node is noted. 2.9 x 2.3 cm right external iliac lymph node is noted. Reproductive: Status post hysterectomy. No adnexal masses. Other: Small fat containing periumbilical hernia is noted. No ascites is noted. Musculoskeletal: No acute or significant osseous findings. IMPRESSION: 1. Enlarged right inguinal and external iliac lymph nodes are noted which are concerning for malignancy or lymphoma. Tissue sampling is recommended. These results will be called to the ordering clinician or representative by  the Radiologist Assistant, and communication documented in the PACS or zVision Dashboard. 2. 5.8 x 3.3 cm fat containing lesion arising from lower pole of left kidney most consistent  with renal angiomyolipoma. Electronically Signed   By: Marijo Conception M.D.   On: 05/30/2020 16:55   NM PET Image Initial (PI) Skull Base To Thigh  Result Date: 06/11/2020 CLINICAL DATA:  Initial treatment strategy for left renal mass. Right inguinal lymphadenopathy. EXAM: NUCLEAR MEDICINE PET SKULL BASE TO THIGH TECHNIQUE: 13.1 mCi F-18 FDG was injected intravenously. Full-ring PET imaging was performed from the skull base to thigh after the radiotracer. CT data was obtained and used for attenuation correction and anatomic localization. Fasting blood glucose: 98 mg/dl COMPARISON:  CT abdomen/pelvis dated 05/30/2020 FINDINGS: Mediastinal blood pool activity: SUV max 1.9 Liver activity: SUV max NA NECK: No hypermetabolic cervical lymphadenopathy. Incidental CT findings: none CHEST: No suspicious pulmonary nodules. 10 mm short axis right axillary node (series 3/image 86), max SUV 6.0. Otherwise, no hypermetabolic thoracic lymphadenopathy. Incidental CT findings: Mild atherosclerotic calcifications of the aortic arch. Mild coronary atherosclerosis of the LAD. ABDOMEN/PELVIS: No abnormal hypermetabolism in the liver, spleen, pancreas, or adrenal glands. 3.0 x 4.4 cm fat density lesion arising from the left lower kidney (series 3/image 170), compatible with a benign renal angiomyolipoma, non FDG avid. No hypermetabolic abdominal/retroperitoneal lymphadenopathy. 2.2 cm short axis right external iliac node (series 3/image 235), max SUV 4.0. 2.0 cm short axis right inguinal node (series 3/image 250), max SUV 10.1. Incidental CT findings: Mild layering gallbladder sludge versus noncalcified gallstones (series 3/image 172). Mild atherosclerotic calcifications the abdominal aorta and branch vessels. Status post hysterectomy. SKELETON: No focal hypermetabolic activity to suggest skeletal metastasis. Incidental CT findings: Degenerative changes of the visualized thoracolumbar spine. IMPRESSION: Hypermetabolic right  inguinal/external iliac nodes, raising concern for lymphoma or nodal metastases. Consider surgical excision of the right inguinal node for tissue diagnosis. Small hypermetabolic right axillary node, possibly reactive. Correlate for recent prior COVID vaccination in the right arm. Otherwise, this warrants attention on follow-up. Benign left renal angiomyolipoma. No findings suspicious for primary malignancy. Electronically Signed   By: Julian Hy M.D.   On: 06/11/2020 09:32   MM 3D SCREEN BREAST BILATERAL  Result Date: 06/05/2020 CLINICAL DATA:  Screening. EXAM: DIGITAL SCREENING BILATERAL MAMMOGRAM WITH TOMO AND CAD COMPARISON:  Previous exam(s). ACR Breast Density Category b: There are scattered areas of fibroglandular density. FINDINGS: There are no findings suspicious for malignancy. Images were processed with CAD. IMPRESSION: No mammographic evidence of malignancy. A result letter of this screening mammogram will be mailed directly to the patient. RECOMMENDATION: Screening mammogram in one year. (Code:[id number]) BI-RADS CATEGORY  1: Negative. Electronically Signed   By: Claudie Revering M.D.   On: 06/05/2020 14:28     ASSESSMENT:  1. Right inguinal and external iliac lymphadenopathy: -Patient presented to Dr. Nevada Crane with left flank pain, left knee pain and left ankle swelling with rash for 3 weeks. Denies any fevers or chills or dysuria or hematuria. -CTAP without contrast on 05/30/2020 showed enlarged right inguinal and external iliac lymph nodes measuring up to 3 cm concerning for malignancy/lymphoma. -He does not have any fevers, night sweats or weight loss in the last 6 months. -Patient never had colonoscopy. Reports stool cards were negative for the past 2 years. No vaginal bleeding reported. -Had mammogram on 06/03/2020 which is still to be read. Previous mammogram was normal. -Had a right axillary lymph node biopsy in 2017 which showed florid lymphoid hyperplasia.  Left breast core biopsy  showed fibrocystic changes and usual ductal hyperplasia. -PET scan on 06/10/2020 shows 2.2 cm right external iliac node SUV 4.0.  2 cm right inguinal node SUV 10.1.  A 10 mm right axillary lymph node SUV 6.0. -Left kidney mass did not show any uptake.  2. Left kidney angiomyolipoma: -There is a 5.8 x 3.3 cm fat-containing lesion arising in the lower pole of the left kidney incidentally seen on CT scan consistent with renal angiomyolipoma.  3. Social/family history: -She is currently retired and denies any chemical exposure. She quit smoking several decades ago. -2 maternal aunts had breast cancer in 1 maternal uncle had stomach cancer.   PLAN:  1. Right inguinal and external iliac lymphadenopathy: -She had a right inguinal lymph node biopsy on 06/19/2020. -I have reviewed results of the biopsy which showed reactive lymphoid hyperplasia.  Flow cytometry was negative.  Findings are nonspecific and differential includes toxoplasmosis, cat scratch disease etc. -I have also talked to her daughter on the phone. -I have recommended follow-up in 6 months with repeat labs and physical exam.  We'll consider scans only if there is any additional symptoms.  2.  Left kidney mass: -No uptake on the recent PET scan consistent with benign angiomyolipoma. -She complains of pain in the left hip region.  Recommend follow-up with orthopedics.  Orders placed this encounter:  No orders of the defined types were placed in this encounter.    Derek Jack, MD [place] [phone]   I, Milinda Antis, am acting as a scribe for Dr. Sanda Linger.  I, Derek Jack MD, have reviewed the above documentation for accuracy and completeness, and I agree with the above.

## 2020-07-02 ENCOUNTER — Other Ambulatory Visit: Payer: Self-pay

## 2020-07-02 ENCOUNTER — Ambulatory Visit (INDEPENDENT_AMBULATORY_CARE_PROVIDER_SITE_OTHER): Payer: Medicare Other | Admitting: General Surgery

## 2020-07-02 ENCOUNTER — Encounter: Payer: Self-pay | Admitting: General Surgery

## 2020-07-02 VITALS — BP 155/94 | HR 59 | Temp 98.3°F | Resp 16 | Ht 69.0 in | Wt 246.0 lb

## 2020-07-02 DIAGNOSIS — R590 Localized enlarged lymph nodes: Secondary | ICD-10-CM

## 2020-07-02 NOTE — Patient Instructions (Addendum)
No submerging in the tub until completely healed, usually 4 weeks.  Follow up with Dr. Delton Coombes.

## 2020-07-02 NOTE — Progress Notes (Signed)
[facility] Note   HPI:  64 y.o. Female presents to clinic for post-op follow-up evaluation after right inguinal lymph node excision. Patient reports she is doing well.   Review of Systems:  No fever or chills No drainage All other review of systems: otherwise negative   Vital Signs:  BP (!) 155/94   Pulse (!) 59   Temp 98.3 F (36.8 C) (Oral)   Resp 16   Ht 5\' 9"  (1.753 m)   Wt 246 lb (111.6 kg)   SpO2 96%   BMI 36.33 kg/m    Physical Exam:  Physical Exam Vitals reviewed.  Cardiovascular:     Rate and Rhythm: Normal rate.  Pulmonary:     Effort: Pulmonary effort is normal.  Musculoskeletal:     Comments: Healing right inguinal incision c/d/i with peeling dermabond, no erythema or drainage      Assessment:  64 y.o. yo Female with right inguinal adenopathy and Reactive lymphoid hyperplasia on pathology.  Plan:  No submerging in the tub until completely healed, usually 4 weeks.  Follow up with Dr. Delton Coombes.   All of the above recommendations were discussed with the patient, and all of patient's questions were answered to her expressed satisfaction.  [name], MD [facility] [address] [place], [place] [address] [phone] (office)

## 2020-10-11 DIAGNOSIS — M791 Myalgia, unspecified site: Secondary | ICD-10-CM | POA: Diagnosis not present

## 2020-10-11 DIAGNOSIS — U071 COVID-19: Secondary | ICD-10-CM | POA: Diagnosis not present

## 2020-10-11 DIAGNOSIS — R059 Cough, unspecified: Secondary | ICD-10-CM | POA: Diagnosis not present

## 2021-01-02 ENCOUNTER — Inpatient Hospital Stay (HOSPITAL_COMMUNITY): Payer: Medicare Other | Attending: Hematology

## 2021-01-09 ENCOUNTER — Ambulatory Visit (HOSPITAL_COMMUNITY): Payer: Medicare Other | Admitting: Hematology

## 2021-03-08 ENCOUNTER — Other Ambulatory Visit (HOSPITAL_COMMUNITY): Payer: Self-pay | Admitting: Hematology

## 2021-03-08 DIAGNOSIS — I10 Essential (primary) hypertension: Secondary | ICD-10-CM

## 2021-03-27 ENCOUNTER — Other Ambulatory Visit: Payer: Self-pay | Admitting: Orthopedic Surgery

## 2021-03-27 DIAGNOSIS — G8929 Other chronic pain: Secondary | ICD-10-CM

## 2021-03-27 DIAGNOSIS — M25562 Pain in left knee: Secondary | ICD-10-CM

## 2021-03-27 MED ORDER — MELOXICAM 7.5 MG PO TABS: 7.5000 mg | ORAL_TABLET | Freq: Every day | ORAL | 5 refills | Status: DC

## 2021-03-27 NOTE — Telephone Encounter (Signed)
Patient called for refill: meloxicam (MOBIC) 7.5 MG tablet 30 tablet 5               [facility] Acrespatient's last visit in office: 05/27/20

## 2021-12-27 ENCOUNTER — Other Ambulatory Visit: Payer: Self-pay

## 2021-12-27 ENCOUNTER — Emergency Department (HOSPITAL_COMMUNITY)
Admission: EM | Admit: 2021-12-27 | Discharge: 2021-12-27 | Disposition: A | Discharge status: Home / Self Care | Payer: Medicare Other | Attending: Student | Admitting: Student

## 2021-12-27 ENCOUNTER — Emergency Department (HOSPITAL_COMMUNITY): Payer: Medicare Other

## 2021-12-27 ENCOUNTER — Encounter (HOSPITAL_COMMUNITY): Payer: Self-pay

## 2021-12-27 DIAGNOSIS — M25472 Effusion, left ankle: Secondary | ICD-10-CM | POA: Diagnosis not present

## 2021-12-27 DIAGNOSIS — I10 Essential (primary) hypertension: Secondary | ICD-10-CM | POA: Insufficient documentation

## 2021-12-27 DIAGNOSIS — M7989 Other specified soft tissue disorders: Secondary | ICD-10-CM | POA: Diagnosis not present

## 2021-12-27 DIAGNOSIS — Z7982 Long term (current) use of aspirin: Secondary | ICD-10-CM | POA: Diagnosis not present

## 2021-12-27 DIAGNOSIS — Z87891 Personal history of nicotine dependence: Secondary | ICD-10-CM | POA: Diagnosis not present

## 2021-12-27 DIAGNOSIS — Z79899 Other long term (current) drug therapy: Secondary | ICD-10-CM | POA: Diagnosis not present

## 2021-12-27 DIAGNOSIS — M25561 Pain in right knee: Secondary | ICD-10-CM | POA: Diagnosis not present

## 2021-12-27 DIAGNOSIS — M25562 Pain in left knee: Secondary | ICD-10-CM | POA: Diagnosis not present

## 2021-12-27 MED ORDER — MELOXICAM 7.5 MG PO TABS: 7.5000 mg | ORAL_TABLET | Freq: Every day | ORAL | 0 refills | Status: DC

## 2021-12-27 MED ORDER — PREDNISONE 10 MG PO TABS: 40.0000 mg | ORAL_TABLET | Freq: Every day | ORAL | 0 refills | Status: DC

## 2021-12-27 MED ORDER — KETOROLAC TROMETHAMINE 15 MG/ML IJ SOLN
15.0000 mg | Freq: Once | INTRAMUSCULAR | Status: AC
  Administered 2021-12-27: 15 mg via INTRAMUSCULAR
  Filled 2021-12-27: qty 1

## 2021-12-27 NOTE — ED Provider Notes (Signed)
?[facility] ?Provider Note ? ? ?CSN: [id number] ?Arrival date & time: 12/27/21  1920 ? ?  ? ?History ?Chief Complaint  ?Patient presents with  ? multiple complaints  ? ? ?Yesenia Montgomery is a 66 y.o. female who presents to the emergency department with a 1 week history of intermittent lower ankle and foot swelling.  Patient also complaining of bilateral knee pain which she states is chronic but has been worse recently.  She denies any fever or chills.  No trauma or injury to the ankle.  Patient also complaining of rash to the forearms.  She states that she was recently treated for urticaria with some steroids which improved however she started having itching over last couple days.  No chest pain, shortness of breath, trouble breathing, trouble swallowing.  She does mention that she has a remote history of gout. ? ?HPI ? ?  ? ?Home Medications ?Prior to Admission medications   ?Medication Sig Start Date End Date Taking? Authorizing Provider  ?meloxicam (MOBIC) 7.5 MG tablet Take 1 tablet (7.5 mg total) by mouth daily. 12/27/21  Yes Raul Del, Rejoice Heatwole M, PA-C  ?predniSONE (DELTASONE) 10 MG tablet Take 4 tablets (40 mg total) by mouth daily. 12/27/21  Yes Lowry Bala M, PA-C  ?amLODipine (NORVASC) 5 MG tablet TAKE 1 TABLET BY MOUTH  DAILY 03/10/21   Derek Jack, MD  ?aspirin EC 81 MG tablet Take 81 mg by mouth daily. Swallow whole.    [provider]  ?atenolol (TENORMIN) 50 MG tablet Take 50 mg by mouth daily. 11/02/19   [provider]  ?levocetirizine (XYZAL) 5 MG tablet Take 5 mg by mouth daily before breakfast.  05/13/20   [provider]  ?montelukast (SINGULAIR) 10 MG tablet Take 10 mg by mouth at bedtime.    [provider]  ?OVER THE COUNTER MEDICATION Take 1 capsule by mouth 2 (two) times daily with a meal. Thermogenics weight loss supplement    [provider]  ?Potassium 99 MG TABS Take 99 mg by mouth daily.    [provider]   ?VITAMIN A PO Take 1 tablet by mouth daily.    [provider]  ?   ? ?Allergies    ?Latex   ? ?Review of Systems   ?Review of Systems  ?All other systems reviewed and are negative. ? ?Physical Exam ?Updated Vital Signs ?BP (!) 212/116 (BP Location: Left Arm)   Pulse (!) 110   Temp 98.6 ?F (37 ?C) (Oral)   Resp 20   Ht '5\' 9"'$  (1.753 m)   Wt 112.5 kg   SpO2 97%   BMI 36.62 kg/m?  ?Physical Exam ?Vitals and nursing note reviewed.  ?Constitutional:   ?   Appearance: Normal appearance.  ?HENT:  ?   Head: Normocephalic and atraumatic.  ?   Comments: Talking in complete sentences. ?Eyes:  ?   General:     ?   Right eye: No discharge.     ?   Left eye: No discharge.  ?   Conjunctiva/sclera: Conjunctivae normal.  ?Pulmonary:  ?   Effort: Pulmonary effort is normal. No respiratory distress.  ?Musculoskeletal:  ?   Right lower leg: No edema.  ?   Left lower leg: No edema.  ?   Comments: Normal range of motion in the knees bilaterally.  No tenderness or swelling of the knees.  Left ankle is mildly swollen and tender to palpation.  Strong dorsalis pedis pulses felt and left  and right foot.  Foot is nontender and nonswollen.  ?Skin: ?   General: Skin is warm and dry.  ?   Findings: No rash.  ?Neurological:  ?   General: No focal deficit present.  ?   Mental Status: She is alert.  ?Psychiatric:     ?   Mood and Affect: Mood normal.     ?   Behavior: Behavior normal.  ? ? ?ED Results / Procedures / Treatments   ?Labs ?(all labs ordered are listed, but only abnormal results are displayed) ?Labs Reviewed - No data to display ? ?EKG ?None ? ?Radiology ?DG Ankle Complete Left ? ?Result Date: 12/27/2021 ?CLINICAL DATA:  Pain and swelling EXAM: LEFT ANKLE COMPLETE - 3+ VIEW COMPARISON:  None. FINDINGS: No fracture or dislocation is seen. There is marked soft tissue swelling around the ankle. There are no opaque foreign bodies or significant soft tissue calcifications. There is linear coarse calcification at the attachment  of Achilles tendon to the calcaneus suggesting calcific tendinosis. Small bony spurs are noted in the dorsal aspect of talonavicular joint. IMPRESSION: No fracture or dislocation is seen in the left ankle. Electronically Signed   By: Elmer Picker M.D.   On: 12/27/2021 20:35  ? ?DG Foot Complete Left ? ?Result Date: 12/27/2021 ?CLINICAL DATA:  Pain and swelling EXAM: LEFT FOOT - COMPLETE 3+ VIEW COMPARISON:  None. FINDINGS: No fracture or dislocation is seen. There is hallux valgus deformity. Small bony spurs seen in first metatarsophalangeal joint. Bony spurs seen in the dorsal aspect of talonavicular joint. There is linear coarse calcification at the attachment of Achilles tendon to the calcaneus suggesting calcific tendinosis. There is tiny plantar spur in the calcaneus. IMPRESSION: No fracture or dislocation is seen in the left foot. Other findings as described in the body of the report. Electronically Signed   By: Elmer Picker M.D.   On: 12/27/2021 20:34   ? ?Procedures ?Procedures  ? ? ?Medications Ordered in ED ?Medications  ?ketorolac (TORADOL) 15 MG/ML injection 15 mg (15 mg Intramuscular Given 12/27/21 2010)  ? ? ?ED Course/ Medical Decision Making/ A&P ?  ?                        ?Medical Decision Making ?Amount and/or Complexity of Data Reviewed ?Radiology: ordered. ? ?Risk ?Prescription drug management. ? ? ?This patient presents to the ED for concern of left ankle swelling and rash over the forearms, this involves an extensive number of treatment options, and is a complaint that carries with it a high risk of complications and morbidity.  The differential diagnosis includes dermatitis, I doubt urticaria at this time, doubt, ankle sprain, arthritis. ? ? ?Co morbidities that complicate the patient evaluation ? ?Past Medical History:  ?Diagnosis Date  ? Arthritis   ? knees  ? Chronic pain   ? HLD (hyperlipidemia) 03/30/2017  ? Hypertension   ? ? ?Additional history obtained: ? ?Additional  history obtained from nursing note ? ? ?Lab Tests: ? ?I Ordered, and personally interpreted labs.  The pertinent results include: None ? ? ?Imaging Studies ordered: ? ?I ordered imaging studies including x-ray over the left ankle and foot ?I independently visualized and interpreted imaging which showed no fracture dislocations. ?I agree with the radiologist interpretation ? ? ?Cardiac Monitoring: ? ?The patient was maintained on a cardiac monitor.  I personally viewed and interpreted the cardiac monitored which showed an underlying rhythm of: Normal sinus rhythm ? ? ?  Medicines ordered and prescription drug management: ? ?I ordered medication including Toradol for pain ?Reevaluation of the patient after these medicines showed that the patient improved ?I have reviewed the patients home medicines and have made adjustments as needed ? ? ?Test Considered: ? ?N/A ? ? ?Critical Interventions: ? ?N/A ? ? ?Consultations Obtained: ? ?N/A ? ? ?Problem List / ED Course: ? ?Patient presents to the emergency room today with multiple complaints.  With regards to her left ankle pain.  Patient does have a remote history of gout although she has never been formally diagnosed with synovial fluid analysis.  I have a low suspicion for septic arthritis at this time.  She has been afebrile and the ankle is minimally tender, not warm to palpation, nor significantly erythematous.  Patient not on any anticoagulation and I have a low suspicion for hemarthrosis.  We will treat conservatively with meloxicam and will give her prescription for prednisone for her rash.  I doubt the rashes urticarial in nature. ? ? ?Reevaluation: ? ?After the interventions noted above, I reevaluated the patient and found that they have :improved ? ? ?Social Determinants of Health: ? ?Social Determinants of Health with Concerns  ? ?Tobacco Use: Medium Risk  ? Smoking Tobacco Use: Former  ? Smokeless Tobacco Use: Never  ? Passive Exposure: Not on file  ?Financial  Resource Strain: Not on file  ?Food Insecurity: Not on file  ?Transportation Needs: Not on file  ?Physical Activity: Not on file  ?Stress: Not on file  ?Social Connections: Not on file  ?Intimate Partner Violence: Not o

## 2021-12-27 NOTE — ED Triage Notes (Signed)
Pt arrived via POV c/o left leg swelling, pain, and rash on bilateral arms after Pt reports being bit by something. Pt reports no relief with OTC medication at home. Pt ambulatory in Triage.  ?

## 2021-12-27 NOTE — Discharge Instructions (Signed)
AndPlease take prednisone for the next 4 days.  I given you a prescription for 30 inflammatory pills you can take once daily.  I would take on a consistent basis for 10 days and then take a break.  Please follow-up with your primary care provider for further evaluation.  Please drink plenty of fluids.  Return to the emergency department for any worsening symptoms. ?

## 2022-01-09 ENCOUNTER — Other Ambulatory Visit (HOSPITAL_COMMUNITY): Payer: Self-pay | Admitting: Internal Medicine

## 2022-01-09 DIAGNOSIS — M25572 Pain in left ankle and joints of left foot: Secondary | ICD-10-CM | POA: Diagnosis not present

## 2022-01-09 DIAGNOSIS — M25561 Pain in right knee: Secondary | ICD-10-CM | POA: Diagnosis not present

## 2022-01-09 DIAGNOSIS — M25562 Pain in left knee: Secondary | ICD-10-CM | POA: Diagnosis not present

## 2022-01-09 DIAGNOSIS — Z1231 Encounter for screening mammogram for malignant neoplasm of breast: Secondary | ICD-10-CM

## 2022-01-15 DIAGNOSIS — I10 Essential (primary) hypertension: Secondary | ICD-10-CM | POA: Diagnosis not present

## 2022-01-15 DIAGNOSIS — E782 Mixed hyperlipidemia: Secondary | ICD-10-CM | POA: Diagnosis not present

## 2022-01-15 DIAGNOSIS — Z0000 Encounter for general adult medical examination without abnormal findings: Secondary | ICD-10-CM | POA: Diagnosis not present

## 2022-01-16 ENCOUNTER — Ambulatory Visit (HOSPITAL_COMMUNITY)
Admission: RE | Admit: 2022-01-16 | Discharge: 2022-01-16 | Disposition: A | Discharge status: Home / Self Care | Payer: Medicare Other | Source: Ambulatory Visit | Attending: Internal Medicine | Admitting: Internal Medicine

## 2022-01-16 DIAGNOSIS — Z1231 Encounter for screening mammogram for malignant neoplasm of breast: Secondary | ICD-10-CM | POA: Diagnosis not present

## 2022-01-22 ENCOUNTER — Ambulatory Visit: Payer: Medicare Other | Admitting: Orthopedic Surgery

## 2022-01-22 DIAGNOSIS — R944 Abnormal results of kidney function studies: Secondary | ICD-10-CM | POA: Diagnosis not present

## 2022-01-22 DIAGNOSIS — M25562 Pain in left knee: Secondary | ICD-10-CM | POA: Diagnosis not present

## 2022-01-22 DIAGNOSIS — Z0001 Encounter for general adult medical examination with abnormal findings: Secondary | ICD-10-CM | POA: Diagnosis not present

## 2022-01-22 DIAGNOSIS — G8929 Other chronic pain: Secondary | ICD-10-CM

## 2022-01-22 DIAGNOSIS — M1712 Unilateral primary osteoarthritis, left knee: Secondary | ICD-10-CM

## 2022-01-22 DIAGNOSIS — M25511 Pain in right shoulder: Secondary | ICD-10-CM | POA: Diagnosis not present

## 2022-01-22 DIAGNOSIS — M1710 Unilateral primary osteoarthritis, unspecified knee: Secondary | ICD-10-CM

## 2022-01-22 NOTE — Patient Instructions (Signed)

## 2022-01-22 NOTE — Progress Notes (Signed)
Chief Complaint  Patient presents with   Knee Pain    LT knee/ last seen 05/27/20 Starting hurting again when she recently re-started  exercising    Encounter Diagnoses  Name Primary?   Chronic pain of left knee Yes   Primary localized osteoarthritis of knee     Procedure note left knee injection   verbal consent was obtained to inject left knee joint  Timeout was completed to confirm the site of injection  The medications used were depomedrol 40 mg and 1% lidocaine 3 cc Anesthesia was provided by ethyl chloride and the skin was prepped with alcohol.  After cleaning the skin with alcohol a 20-gauge needle was used to inject the left knee joint. There were no complications. A sterile bandage was applied.

## 2022-02-13 ENCOUNTER — Encounter: Payer: Self-pay | Admitting: Obstetrics & Gynecology

## 2022-02-13 ENCOUNTER — Ambulatory Visit (INDEPENDENT_AMBULATORY_CARE_PROVIDER_SITE_OTHER): Payer: Medicare Other | Admitting: Obstetrics & Gynecology

## 2022-02-13 VITALS — BP 154/91 | HR 57 | Ht 69.0 in | Wt 249.0 lb

## 2022-02-13 DIAGNOSIS — Z01419 Encounter for gynecological examination (general) (routine) without abnormal findings: Secondary | ICD-10-CM | POA: Diagnosis not present

## 2022-02-13 DIAGNOSIS — Z1211 Encounter for screening for malignant neoplasm of colon: Secondary | ICD-10-CM

## 2022-02-13 DIAGNOSIS — Z1212 Encounter for screening for malignant neoplasm of rectum: Secondary | ICD-10-CM | POA: Diagnosis not present

## 2022-02-13 NOTE — Progress Notes (Signed)
Subjective:     Yesenia Montgomery is a 66 y.o. female here for a routine exam.  No LMP recorded. Patient has had a hysterectomy. [id number] Birth Control Method:  hysterectomy Menstrual Calendar(currently): n/a  Current complaints: none.   Current acute medical issues:  none   Recent Gynecologic History No LMP recorded. Patient has had a hysterectomy. Last Pap: 2019,  normal Last mammogram: 2023,  normal  Past Medical History:  Diagnosis Date   Arthritis    knees   Chronic pain    HLD (hyperlipidemia) 03/30/2017   Hypertension     Past Surgical History:  Procedure Laterality Date   ABDOMINAL HYSTERECTOMY N/A 10/27/2017   Procedure: TOTAL ABDOMINAL HYSTERECTOMY;  Surgeon: Florian Buff, MD;  Location: [facility];  Service: Gynecology;  Laterality: N/A;   BREAST BIOPSY Left    foot surgery Right    INGUINAL LYMPH NODE BIOPSY Right 06/19/2020   Procedure: RIGHT INGUINAL LYMPH NODE EXCISIONAL BIOPSY;  Surgeon: Virl Cagey, MD;  Location: [facility];  Service: General;  Laterality: Right;   SALPINGOOPHORECTOMY Bilateral 10/27/2017   Procedure: BILATERAL SALPINGO OOPHORECTOMY;  Surgeon: Florian Buff, MD;  Location: [facility];  Service: Gynecology;  Laterality: Bilateral;   TONSILLECTOMY      OB History     Gravida  3   Para  2   Term  2   Preterm      AB  1   Living  2      SAB      IAB      Ectopic      Multiple      Live Births              Social History   Socioeconomic History   Marital status: Married    Spouse name: Not on file   Number of children: 2   Years of education: 12   Highest education level: Not on file  Occupational History   Occupation: retired/disabled    Comment: [employer]  Tobacco Use   Smoking status: Former    Packs/day: 0.25    Years: 5.00    Total pack years: 1.25    Types: Cigarettes    Quit date: 10/23/1983    Years since quitting: 38.3   Smokeless tobacco: Never  Vaping Use   Vaping Use: Never  used  Substance and Sexual Activity   Alcohol use: Yes    Comment: occasional glass of wine   Drug use: No   Sexual activity: Not Currently    Birth control/protection: Post-menopausal    Comment: widow  Other Topics Concern   Not on file  Social History Narrative   Lives alone   Children in [place]   Has lived in [place] since 2009 - husband was a Set designer to [employer], swims   reads at the [employer] Strain: Medium Risk (02/13/2022)   Overall Financial Resource Strain (CARDIA)    Difficulty of Paying Living Expenses: Somewhat hard  Food Insecurity: No Food Insecurity (02/13/2022)   Hunger Vital Sign    Worried About [place] in the Last Year: Never true    [place] in the Last Year: Never true  Transportation Needs: No Transportation Needs (02/13/2022)   PRAPARE - Hydrologist (Medical): No    Lack of Transportation (Non-Medical): No  Physical Activity: Insufficiently Active (  02/13/2022)   Exercise Vital Sign    Days of Exercise per Week: 3 days    Minutes of Exercise per Session: 30 min  Stress: No Stress Concern Present (02/13/2022)   [place]    Feeling of Stress : Not at all  Social Connections: Unknown (02/13/2022)   Social Connection and Isolation Panel [NHANES]    Frequency of Communication with Friends and Family: Once a week    Frequency of Social Gatherings with Friends and Family: Patient refused    Attends Religious Services: More than 4 times per year    Active Member of [employer] or Organizations: No    Attends Archivist Meetings: Never    Marital Status: Patient refused    Family History  Problem Relation Age of Onset   Stroke Mother    Heart disease Mother    Hypertension Mother    Hyperlipidemia Mother    Heart attack Mother    Early death Father        bedridden by age of 2 ?   Alcohol  abuse Father    Cancer Paternal Aunt    Diabetes Cousin    Breast cancer Maternal Aunt    Breast cancer Maternal Uncle      Current Outpatient Medications:    amLODipine (NORVASC) 5 MG tablet, TAKE 1 TABLET BY MOUTH  DAILY, Disp: 30 tablet, Rfl: 11   aspirin EC 81 MG tablet, Take 81 mg by mouth daily. Swallow whole., Disp: , Rfl:    atenolol (TENORMIN) 50 MG tablet, Take 50 mg by mouth daily., Disp: , Rfl:    montelukast (SINGULAIR) 10 MG tablet, Take 10 mg by mouth at bedtime., Disp: , Rfl:    VITAMIN A PO, Take 1 tablet by mouth daily., Disp: , Rfl:    levocetirizine (XYZAL) 5 MG tablet, Take 5 mg by mouth daily before breakfast.  (Patient not taking: Reported on 02/13/2022), Disp: , Rfl:    meloxicam (MOBIC) 7.5 MG tablet, Take 1 tablet (7.5 mg total) by mouth daily. (Patient not taking: Reported on 02/13/2022), Disp: 30 tablet, Rfl: 0   OVER THE COUNTER MEDICATION, Take 1 capsule by mouth 2 (two) times daily with a meal. [employer]ics weight loss supplement, Disp: , Rfl:    Potassium 99 MG TABS, Take 99 mg by mouth daily. (Patient not taking: Reported on 02/13/2022), Disp: , Rfl:    predniSONE (DELTASONE) 10 MG tablet, Take 4 tablets (40 mg total) by mouth daily. (Patient not taking: Reported on 02/13/2022), Disp: 16 tablet, Rfl: 0  Review of Systems  Review of Systems  Constitutional: Negative for fever, chills, weight loss, malaise/fatigue and diaphoresis.  HENT: Negative for hearing loss, ear pain, nosebleeds, congestion, sore throat, neck pain, tinnitus and ear discharge.   Eyes: Negative for blurred vision, double vision, photophobia, pain, discharge and redness.  Respiratory: Negative for cough, hemoptysis, sputum production, shortness of breath, wheezing and stridor.   Cardiovascular: Negative for chest pain, palpitations, orthopnea, claudication, leg swelling and PND.  Gastrointestinal: negative for abdominal pain. Negative for heartburn, nausea, vomiting, diarrhea, constipation,  blood in stool and melena.  Genitourinary: Negative for dysuria, urgency, frequency, hematuria and flank pain.  Musculoskeletal: Negative for myalgias, back pain, joint pain and falls.  Skin: Negative for itching and rash.  Neurological: Negative for dizziness, tingling, tremors, sensory change, speech change, focal weakness, seizures, loss of consciousness, weakness and headaches.  Endo/Heme/Allergies: Negative for environmental allergies and polydipsia. Does not  bruise/bleed easily.  Psychiatric/Behavioral: Negative for depression, suicidal ideas, hallucinations, memory loss and substance abuse. The patient is not nervous/anxious and does not have insomnia.        Objective:  Blood pressure (!) 154/91, pulse (!) 57, height '5\' 9"'$  (1.753 m), weight 249 lb (112.9 kg).   Physical Exam  Vitals reviewed. Constitutional: She is oriented to person, place, and time. She appears well-developed and well-nourished.  HENT:  Head: Normocephalic and atraumatic.        Right Ear: External ear normal.  Left Ear: External ear normal.  Nose: Nose normal.  Mouth/Throat: Oropharynx is clear and moist.  Eyes: Conjunctivae and EOM are normal. Pupils are equal, round, and reactive to light. Right eye exhibits no discharge. Left eye exhibits no discharge. No scleral icterus.  Neck: Normal range of motion. Neck supple. No tracheal deviation present. No thyromegaly present.  Cardiovascular: Normal rate, regular rhythm, normal heart sounds and intact distal pulses.  Exam reveals no gallop and no friction rub.   No murmur heard. Respiratory: Effort normal and breath sounds normal. No respiratory distress. She has no wheezes. She has no rales. She exhibits no tenderness.  GI: Soft. Bowel sounds are normal. She exhibits no distension and no mass. There is no tenderness. There is no rebound and no guarding.  Genitourinary:  Breasts no masses skin changes or nipple changes bilaterally      Vulva is normal without  lesions Vagina is pink moist without discharge Cervix absent Uterus is absent Adnexa is negative with normal sized ovaries  {Rectal    hemoccult negative, normal tone, no masses  Musculoskeletal: Normal range of motion. She exhibits no edema and no tenderness.  Neurological: She is alert and oriented to person, place, and time. She has normal reflexes. She displays normal reflexes. No cranial nerve deficit. She exhibits normal muscle tone. Coordination normal.  Skin: Skin is warm and dry. No rash noted. No erythema. No pallor.  Psychiatric: She has a normal mood and affect. Her behavior is normal. Judgment and thought content normal.       Medications Ordered at today's visit: No orders of the defined types were placed in this encounter.   Other orders placed at today's visit: No orders of the defined types were placed in this encounter.     Assessment:    Normal Gyn exam.   S/P [facility] BSO Plan:    Contraception: status post hysterectomy. Follow up in: 3 years.     Return in about 3 years (around 02/13/2025), or if symptoms worsen or fail to improve.

## 2022-02-26 ENCOUNTER — Other Ambulatory Visit (HOSPITAL_COMMUNITY): Payer: Self-pay | Admitting: Hematology

## 2022-02-26 DIAGNOSIS — I10 Essential (primary) hypertension: Secondary | ICD-10-CM

## 2022-05-21 DIAGNOSIS — I10 Essential (primary) hypertension: Secondary | ICD-10-CM | POA: Diagnosis not present

## 2022-05-21 DIAGNOSIS — R600 Localized edema: Secondary | ICD-10-CM | POA: Diagnosis not present

## 2022-05-26 DIAGNOSIS — R600 Localized edema: Secondary | ICD-10-CM | POA: Diagnosis not present

## 2022-05-26 DIAGNOSIS — I10 Essential (primary) hypertension: Secondary | ICD-10-CM | POA: Diagnosis not present

## 2022-06-17 DIAGNOSIS — R7301 Impaired fasting glucose: Secondary | ICD-10-CM | POA: Diagnosis not present

## 2022-06-17 DIAGNOSIS — E782 Mixed hyperlipidemia: Secondary | ICD-10-CM | POA: Diagnosis not present

## 2022-06-24 DIAGNOSIS — M25511 Pain in right shoulder: Secondary | ICD-10-CM | POA: Diagnosis not present

## 2022-06-24 DIAGNOSIS — Z1211 Encounter for screening for malignant neoplasm of colon: Secondary | ICD-10-CM | POA: Diagnosis not present

## 2022-06-24 DIAGNOSIS — Z0000 Encounter for general adult medical examination without abnormal findings: Secondary | ICD-10-CM | POA: Diagnosis not present

## 2022-06-24 DIAGNOSIS — K7689 Other specified diseases of liver: Secondary | ICD-10-CM | POA: Diagnosis not present

## 2022-06-24 DIAGNOSIS — I10 Essential (primary) hypertension: Secondary | ICD-10-CM | POA: Diagnosis not present

## 2022-06-24 DIAGNOSIS — R600 Localized edema: Secondary | ICD-10-CM | POA: Diagnosis not present

## 2022-06-24 DIAGNOSIS — E782 Mixed hyperlipidemia: Secondary | ICD-10-CM | POA: Diagnosis not present

## 2022-06-24 DIAGNOSIS — M25562 Pain in left knee: Secondary | ICD-10-CM | POA: Diagnosis not present

## 2022-06-24 DIAGNOSIS — R944 Abnormal results of kidney function studies: Secondary | ICD-10-CM | POA: Diagnosis not present

## 2022-06-24 DIAGNOSIS — R7301 Impaired fasting glucose: Secondary | ICD-10-CM | POA: Diagnosis not present

## 2022-07-09 DIAGNOSIS — Z1211 Encounter for screening for malignant neoplasm of colon: Secondary | ICD-10-CM | POA: Diagnosis not present

## 2022-09-17 ENCOUNTER — Encounter (INDEPENDENT_AMBULATORY_CARE_PROVIDER_SITE_OTHER): Payer: Self-pay | Admitting: Internal Medicine

## 2022-09-17 ENCOUNTER — Ambulatory Visit (INDEPENDENT_AMBULATORY_CARE_PROVIDER_SITE_OTHER): Payer: Medicare HMO | Admitting: Internal Medicine

## 2022-09-17 VITALS — BP 170/100 | HR 62 | Temp 98.1°F | Ht 67.0 in | Wt 241.0 lb

## 2022-09-17 DIAGNOSIS — R7303 Prediabetes: Secondary | ICD-10-CM

## 2022-09-17 DIAGNOSIS — Z6837 Body mass index (BMI) 37.0-37.9, adult: Secondary | ICD-10-CM | POA: Diagnosis not present

## 2022-09-17 DIAGNOSIS — E785 Hyperlipidemia, unspecified: Secondary | ICD-10-CM | POA: Diagnosis not present

## 2022-09-17 DIAGNOSIS — I10 Essential (primary) hypertension: Secondary | ICD-10-CM | POA: Diagnosis not present

## 2022-09-17 DIAGNOSIS — Z0289 Encounter for other administrative examinations: Secondary | ICD-10-CM

## 2022-09-17 NOTE — Progress Notes (Signed)
Office: [phone]  /  Fax: [phone]   Initial Visit  Nathanial Millman was seen in clinic today to evaluate for obesity. She is interested in losing weight to improve overall health and reduce the risk of weight related complications. She presents today to review program treatment options, initial physical assessment, and evaluation.  Her peak weight has been 276 pounds.  She was working with a nutritionist in [place] and has lost about 30 pounds monitoring portions and making healthy choices.  She has noticed plateauing for the last several months and is concerned about this not sure what to do.  She was referred by: PCP  When asked what else they would like to accomplish? She states: Improve existing medical conditions, Reduce number of medications, and Improve quality of life  When asked how has your weight affected you? She states: Contributed to medical problems, Contributed to orthopedic problems or mobility issues, and Having poor endurance  Some associated conditions: Hypertension, Hyperlipidemia, Prediabetes, and Vitamin D Deficiency  Contributing factors: Nutritional, Medications, Reduced physical activity, Pregnancy, and Menopause  Weight promoting medications identified: Beta-blockers  Current nutrition plan: Portion control / smart choices and Journaling  Current level of physical activity: Walking  Current or previous pharmacotherapy: None  Response to medication: Never tried medications   Past medical history includes:   Past Medical History:  Diagnosis Date   Arthritis    knees   Chronic pain    HLD (hyperlipidemia) 03/30/2017   Hypertension      Objective:   BP (!) 170/100   Pulse 62   Temp 98.1 F (36.7 C)   Ht '5\' 7"'$  (1.702 m)   Wt 241 lb (109.3 kg)   SpO2 99%   BMI 37.75 kg/m  She was weighed on the bioimpedance scale: Body mass index is 37.75 kg/m.  Peak Weight:276   General:  Alert, oriented and cooperative. Patient is in no acute distress.   Respiratory: Normal respiratory effort, no problems with respiration noted  Extremities: Normal range of motion.    Mental Status: Normal mood and affect. Normal behavior. Normal judgment and thought content.   DIAGNOSTIC DATA REVIEWED:  BMET    Component Value Date/Time   NA 138 06/04/2020 1435   K 3.8 06/04/2020 1435   CL 100 06/04/2020 1435   CO2 29 06/04/2020 1435   GLUCOSE 110 (H) 06/04/2020 1435   BUN 16 06/04/2020 1435   CREATININE 0.82 06/04/2020 1435   CREATININE 0.90 03/04/2017 1404   CALCIUM 10.0 06/04/2020 1435   GFRNONAA >60 06/04/2020 1435   GFRNONAA 69 03/04/2017 1404   GFRAA >60 10/28/2017 0418   GFRAA 79 03/04/2017 1404   Lab Results  Component Value Date   HGBA1C 5.6 03/04/2017   No results found for: "INSULIN" CBC    Component Value Date/Time   WBC 5.9 06/04/2020 1435   RBC 4.46 06/04/2020 1435   HGB 13.1 06/04/2020 1435   HCT 41.6 06/04/2020 1435   PLT 269 06/04/2020 1435   MCV 93.3 06/04/2020 1435   MCH 29.4 06/04/2020 1435   MCHC 31.5 06/04/2020 1435   RDW 13.4 06/04/2020 1435   Iron/TIBC/Ferritin/ %Sat No results found for: "IRON", "TIBC", "FERRITIN", "IRONPCTSAT" Lipid Panel     Component Value Date/Time   CHOL 175 03/04/2017 1404   TRIG 231 (H) 03/04/2017 1404   HDL 33 (L) 03/04/2017 1404   CHOLHDL 5.3 (H) 03/04/2017 1404   VLDL 46 (H) 03/04/2017 1404   LDLCALC 96 03/04/2017 1404   Hepatic  Function Panel     Component Value Date/Time   PROT 8.1 06/04/2020 1435   ALBUMIN 4.2 06/04/2020 1435   AST 17 06/04/2020 1435   ALT 15 06/04/2020 1435   ALKPHOS 55 06/04/2020 1435   BILITOT 0.7 06/04/2020 1435      Assessment and Plan:  1. Class 2 severe obesity with serious comorbidity and body mass index (BMI) of 37.0 to 37.9 in adult, unspecified obesity type ([place]) We reviewed weight, biometrics, associated medical conditions and contributing factors with patient. She would benefit from weight loss therapy via a modified calorie,  low-carb, high-protein nutritional plan tailored to their REE (resting energy expenditure) which will be determined by indirect calorimetry.  We will also assess for cardiometabolic risk and nutritional derangements via fasting serologies at her next appointment.  2. Pre-diabetes Based on fasting blood glucose of 110.  Patient informed of disease state and risk of progression. This may contribute to abnormal cravings, fatigue and diabetes complications without having diabetes.   We reviewed treatment options which include weight loss of about 7 to 10% of body weight, increasing physical activity to 150 minutes a week of moderate intensity and pharmacotherapy with metformin.    3. Hyperlipidemia, unspecified hyperlipidemia type She had triglycerides of 231 with an HDL of 33 LDL of 96.  She likely has insulin resistance.  This will improve with changes in macronutrient composition of her diet and weight loss therapy.  She is currently not on lipid-lowering medication.  4. Essential hypertension Blood pressure reading in the office is elevated.  We did review home blood pressure monitoring as she has been logging her blood pressures and they have been consistently between 120 and 130 some readings in the 140s.  She is currently on atenolol which may cause weight gain and this was conveyed to her.  Fortunately there are other weight neutral blood pressure medications.  She is also on amlodipine.  We will review blood pressure monitoring technique with her at the next office visit and make sure she has an appropriate cuff and technique.  I reviewed recent renal parameters and they are within normal limits.       Obesity Treatment / Action Plan:  Patient will work on garnering support from family and friends to begin weight loss journey. Will work on eliminating or reducing the presence of highly palatable, calorie dense foods in the home. Will complete provided nutritional and psychosocial assessment  questionnaire before the next appointment. Will be scheduled for indirect calorimetry to determine resting energy expenditure in a fasting state.  This will allow [place] to create a reduced calorie, high-protein meal plan to promote loss of fat mass while preserving muscle mass. Counseled on the health benefits of losing 5%-15% of total body weight. Was counseled on nutritional approaches to weight loss and benefits of complex carbs and high quality protein as part of nutritional weight management. Was counseled on pharmacotherapy and role as an adjunct in weight management.   Obesity Education Performed Today:  She was weighed on the bioimpedance scale and results were discussed and documented in the synopsis.  We discussed obesity as a disease and the importance of a more detailed evaluation of all the factors contributing to the disease.  We discussed the importance of long term lifestyle changes which include nutrition, exercise and behavioral modifications as well as the importance of customizing this to her specific health and social needs.  We discussed the benefits of reaching a healthier weight to alleviate the symptoms  of existing conditions and reduce the risks of the biomechanical, metabolic and psychological effects of obesity.  Nathanial Millman appears to be in the action stage of change and states they are ready to start intensive lifestyle modifications and behavioral modifications.  30 minutes was spent today on this visit including the above counseling, pre-visit chart review, and post-visit documentation.  Reviewed by clinician on day of visit: allergies, medications, problem list, medical history, surgical history, family history, social history, and previous encounter notes.    I have reviewed the above documentation for accuracy and completeness, and I agree with the above.  Thomes Dinning, MD

## 2022-10-21 ENCOUNTER — Encounter (INDEPENDENT_AMBULATORY_CARE_PROVIDER_SITE_OTHER): Payer: Self-pay | Admitting: Internal Medicine

## 2022-10-21 ENCOUNTER — Ambulatory Visit (INDEPENDENT_AMBULATORY_CARE_PROVIDER_SITE_OTHER): Payer: Medicare HMO | Admitting: Internal Medicine

## 2022-10-21 VITALS — BP 165/100 | HR 59 | Temp 98.0°F | Ht 67.0 in | Wt 240.0 lb

## 2022-10-21 DIAGNOSIS — Z6837 Body mass index (BMI) 37.0-37.9, adult: Secondary | ICD-10-CM | POA: Diagnosis not present

## 2022-10-21 DIAGNOSIS — Z1331 Encounter for screening for depression: Secondary | ICD-10-CM

## 2022-10-21 DIAGNOSIS — R5383 Other fatigue: Secondary | ICD-10-CM

## 2022-10-21 DIAGNOSIS — R7303 Prediabetes: Secondary | ICD-10-CM | POA: Diagnosis not present

## 2022-10-21 DIAGNOSIS — R0602 Shortness of breath: Secondary | ICD-10-CM | POA: Diagnosis not present

## 2022-10-21 DIAGNOSIS — E785 Hyperlipidemia, unspecified: Secondary | ICD-10-CM | POA: Diagnosis not present

## 2022-10-21 DIAGNOSIS — I10 Essential (primary) hypertension: Secondary | ICD-10-CM

## 2022-10-21 DIAGNOSIS — E559 Vitamin D deficiency, unspecified: Secondary | ICD-10-CM

## 2022-10-21 HISTORY — DX: Encounter for screening for depression: Z13.31

## 2022-10-21 MED ORDER — AMLODIPINE BESYLATE 5 MG PO TABS: 5.0000 mg | ORAL_TABLET | Freq: Every day | ORAL | 0 refills | Status: DC

## 2022-10-21 NOTE — Assessment & Plan Note (Signed)
Uncontrolled.  We had to spend a few minutes reconciling her medications.  It looks like she has not been taking amlodipine 5 mg a day.  She is currently taking atenolol 50 mg and losartan 50 mg daily.  I counseled her on risk associated with uncontrolled blood pressure.  She has a blood pressure monitor at home and was advised to check blood pressures in the morning and also before bedtime.  She will be contacting her PCP for a follow-up soon as possible.  I am taking the liberty of refilling her amlodipine 5 mg which she will start today.  We are also checking renal parameters

## 2022-10-21 NOTE — Assessment & Plan Note (Signed)
Her IC was 1123 which is less than her predicted BMR likely due to time restricted eating and low levels of physical activity.  She was started on a category 1 plan which is isocaloric.  We will be working on distribution of calories throughout the day and increasing protein with a threshold of 30 g per meal to increase protein synthesis and thermogenesis.

## 2022-10-21 NOTE — Assessment & Plan Note (Signed)
I am unable to see her most recent A1c.  She tells me she was told she was prediabetic.  Patient informed of disease state and risk of progression. This may contribute to abnormal cravings, fatigue and diabetes complications without having diabetes.   We reviewed treatment options which include weight loss of about 7 to 10% of body weight, increasing physical activity to 150 minutes a week of moderate intensity and pharmacotherapy with metformin.  We will check hemoglobin A1c, fasting blood sugar and insulin levels.

## 2022-10-21 NOTE — Assessment & Plan Note (Signed)
Most recent vitamin D levels  Lab Results  Component Value Date   VD25OH 28 (L) 03/04/2017     Deficiency state associated with adiposity and may result in leptin resistance, weight gain and fatigue. Currently on vitamin D supplementation without any adverse effects.  Plan: Check vitamin D levels today to determine adequacy of supplementation.

## 2022-10-21 NOTE — Assessment & Plan Note (Signed)
She is currently not on therapy.  We will check a fasting lipid profile today and assess cardiovascular risk.

## 2022-10-21 NOTE — Progress Notes (Unsigned)
Chief Complaint:   OBESITY Yesenia Montgomery (MR# [id number]) is a 67 y.o. female who presents for evaluation and treatment of obesity and related comorbidities. Current BMI is Body mass index is 37.59 kg/m. Yesenia Montgomery has been struggling with her weight for many years and has been unsuccessful in either losing weight, maintaining weight loss, or reaching her healthy weight goal.  Yesenia Montgomery is currently in the action stage of change and ready to dedicate time achieving and maintaining a healthier weight. Yesenia Montgomery is interested in becoming our patient and working on intensive lifestyle modifications including (but not limited to) diet and exercise for weight loss.  Yesenia Montgomery's habits were reviewed today and are as follows: she thinks her family will eat healthier with her, she started gaining weight during pregnancy, her heaviest weight ever was 276 pounds, she skips meals frequently, and she is frequently drinking liquids with calories.  Depression Screen [name]'s Food and Mood (modified PHQ-9) score was 2.  Subjective:   1. Other fatigue Yesenia Montgomery admits to daytime somnolence and denies waking up still tired. Patient has a history of symptoms of daytime fatigue. Yesenia Montgomery generally gets 6+ hours of sleep per night, and states that she has generally restful sleep. Snoring is present. Apneic episodes are not present. Epworth Sleepiness Score is 2.   2. SOB (shortness of breath) on exertion [name] notes increasing shortness of breath with exercising and seems to be worsening over time with weight gain. She notes getting out of breath sooner with activity than she used to. This has not gotten worse recently. Yesenia Montgomery denies shortness of breath at rest or orthopnea.  3. Pre-diabetes I am unable to see her most recent A1c.  She tells me she was told she was prediabetic.  Patient informed of disease state and risk of progression. This may contribute to abnormal cravings, fatigue and diabetes complications without  having diabetes.   4. Essential hypertension Uncontrolled.  We had to spend a few minutes reconciling her medications.  It looks like she has not been taking amlodipine 5 mg a day.  She is currently taking atenolol 50 mg and losartan 50 mg daily.    5. Hyperlipidemia, unspecified hyperlipidemia type She is currently not on therapy.    6. Vitamin D deficiency Deficiency state associated with adiposity and may result in leptin resistance, weight gain and fatigue. Currently on vitamin D supplementation without any adverse effects. Most recent vitamin D levels  Lab Results  Component Value Date   VD25OH 28 (L) 03/04/2017   Assessment/Plan:   1. Other fatigue Yesenia Montgomery does feel that her weight is causing her energy to be lower than it should be. Fatigue may be related to obesity, depression or many other causes. Labs will be ordered, and in the meanwhile, Yesenia Montgomery will focus on self care including making healthy food choices, increasing physical activity and focusing on stress reduction.  - EKG 12-Lead - Vitamin B12  2. SOB (shortness of breath) on exertion Yesenia Montgomery does feel that she gets out of breath more easily that she used to when she exercises. Yesenia Montgomery's shortness of breath appears to be obesity related and exercise induced. She has agreed to work on weight loss and gradually increase exercise to treat her exercise induced shortness of breath. Will continue to monitor closely.  3. Pre-diabetes We reviewed treatment options which include weight loss of about 7 to 10% of body weight, increasing physical activity to 150 minutes a week of moderate intensity and pharmacotherapy with metformin.  We will check hemoglobin A1c, fasting blood sugar and insulin levels.  - Insulin, random - Hemoglobin A1c - Comprehensive metabolic panel  4. Essential hypertension I counseled her on risk associated with uncontrolled blood pressure.  She has a blood pressure monitor at home and was advised to check  blood pressures in the morning and also before bedtime.  She will be contacting her PCP for a follow-up soon as possible.  I am taking the liberty of refilling her amlodipine 5 mg which she will start today.  We are also checking renal parameters.  - amLODipine (NORVASC) 5 MG tablet; Take 1 tablet (5 mg total) by mouth daily.  Dispense: 30 tablet; Refill: 0  5. Hyperlipidemia, unspecified hyperlipidemia type We will check a fasting lipid profile today and assess cardiovascular risk.  - TSH - Lipid Panel With LDL/HDL Ratio  6. Vitamin D deficiency Check vitamin D levels today to determine adequacy of supplementation.  - VITAMIN D 25 Hydroxy (Vit-D Deficiency, Fractures)  7. Depression screen Yesenia Montgomery had a negative depression screening.   8. Class 2 severe obesity with serious comorbidity and body mass index (BMI) of 37.0 to 37.9 in adult, unspecified obesity type ([place]) Her IC was 1123 which is less than her predicted BMR likely due to time restricted eating and low levels of physical activity.  She was started on a category 1 plan which is isocaloric.  We will be working on distribution of calories throughout the day and increasing protein with a threshold of 30 g per meal to increase protein synthesis and thermogenesis.  Yesenia Montgomery is currently in the action stage of change and her goal is to continue with weight loss efforts. I recommend Yesenia Montgomery begin the structured treatment plan as follows:  She has agreed to the Category 1 Plan.  Exercise goals: No exercise has been prescribed at this time.   Behavioral modification strategies: increasing lean protein intake, decreasing simple carbohydrates, increasing vegetables, increasing water intake, no skipping meals, meal planning and cooking strategies, better snacking choices, and planning for success.  She was informed of the importance of frequent follow-up visits to maximize her success with intensive lifestyle modifications for her multiple  health conditions. She was informed we would discuss her lab results at her next visit unless there is a critical issue that needs to be addressed sooner. Yesenia Montgomery agreed to keep her next visit at the agreed upon time to discuss these results.  Objective:   Blood pressure (!) 165/100, pulse (!) 59, temperature 98 F (36.7 C), height '5\' 7"'$  (1.702 m), weight 240 lb (108.9 kg), SpO2 99 %. Body mass index is 37.59 kg/m.  EKG: Normal sinus rhythm, rate 54 BPM.  Indirect Calorimeter completed today shows a VO2 of 163 and a REE of 1123.  Her calculated basal metabolic rate is [id number] thus her basal metabolic rate is worse than expected.  General: Cooperative, alert, well developed, in no acute distress. HEENT: Conjunctivae and lids unremarkable. Cardiovascular: Regular rhythm.  Lungs: Normal work of breathing. Neurologic: No focal deficits.   Lab Results  Component Value Date   CREATININE 0.97 10/21/2022   BUN 16 10/21/2022   NA 140 10/21/2022   K 4.6 10/21/2022   CL 100 10/21/2022   CO2 26 10/21/2022   Lab Results  Component Value Date   ALT 20 10/21/2022   AST 24 10/21/2022   ALKPHOS 74 10/21/2022   BILITOT 0.5 10/21/2022   Lab Results  Component Value Date   HGBA1C 5.8 (H) 10/21/2022  HGBA1C 5.6 03/04/2017   Lab Results  Component Value Date   INSULIN 12.7 10/21/2022   Lab Results  Component Value Date   TSH 1.350 10/21/2022   Lab Results  Component Value Date   CHOL 216 (H) 10/21/2022   HDL 45 10/21/2022   LDLCALC 141 (H) 10/21/2022   TRIG 164 (H) 10/21/2022   CHOLHDL 5.3 (H) 03/04/2017   Lab Results  Component Value Date   WBC 5.9 06/04/2020   HGB 13.1 06/04/2020   HCT 41.6 06/04/2020   MCV 93.3 06/04/2020   PLT 269 06/04/2020   No results found for: "IRON", "TIBC", "FERRITIN"  Attestation Statements:   Reviewed by clinician on day of visit: allergies, medications, problem list, medical history, surgical history, family history, social history, and  previous encounter notes.  Time spent on visit including pre-visit chart review and post-visit charting and care was 40 minutes.   Wilhemena Durie, am acting as transcriptionist for Thomes Dinning, MD.  I have reviewed the above documentation for accuracy and completeness, and I agree with the above. -Thomes Dinning, MD

## 2022-10-22 LAB — COMPREHENSIVE METABOLIC PANEL
ALT: 20 IU/L (ref 0–32)
AST: 24 IU/L (ref 0–40)
Albumin/Globulin Ratio: 1.3 (ref 1.2–2.2)
Albumin: 4.5 g/dL (ref 3.9–4.9)
Alkaline Phosphatase: 74 IU/L (ref 44–121)
BUN/Creatinine Ratio: 16 (ref 12–28)
BUN: 16 mg/dL (ref 8–27)
Bilirubin Total: 0.5 mg/dL (ref 0.0–1.2)
CO2: 26 mmol/L (ref 20–29)
Calcium: 10.2 mg/dL (ref 8.7–10.3)
Chloride: 100 mmol/L (ref 96–106)
Creatinine, Ser: 0.97 mg/dL (ref 0.57–1.00)
Globulin, Total: 3.4 g/dL (ref 1.5–4.5)
Glucose: 97 mg/dL (ref 70–99)
Potassium: 4.6 mmol/L (ref 3.5–5.2)
Sodium: 140 mmol/L (ref 134–144)
Total Protein: 7.9 g/dL (ref 6.0–8.5)
eGFR: 64 mL/min/{1.73_m2} (ref >59)

## 2022-10-22 LAB — LIPID PANEL WITH LDL/HDL RATIO
Cholesterol, Total: 216 mg/dL — ABNORMAL HIGH (ref 100–199)
HDL: 45 mg/dL (ref >39)
LDL Chol Calc (NIH): 141 mg/dL — ABNORMAL HIGH (ref 0–99)
LDL/HDL Ratio: 3.1 ratio (ref 0.0–3.2)
Triglycerides: 164 mg/dL — ABNORMAL HIGH (ref 0–149)
VLDL Cholesterol Cal: 30 mg/dL (ref 5–40)

## 2022-10-22 LAB — TSH: TSH: 1.35 u[IU]/mL (ref 0.450–4.500)

## 2022-10-22 LAB — HEMOGLOBIN A1C
Est. average glucose Bld gHb Est-mCnc: 120 mg/dL
Hgb A1c MFr Bld: 5.8 % — ABNORMAL HIGH (ref 4.8–5.6)

## 2022-10-22 LAB — VITAMIN B12: Vitamin B-12: 716 pg/mL (ref 232–1245)

## 2022-10-22 LAB — INSULIN, RANDOM: INSULIN: 12.7 u[IU]/mL (ref 2.6–24.9)

## 2022-10-22 LAB — VITAMIN D 25 HYDROXY (VIT D DEFICIENCY, FRACTURES): Vit D, 25-Hydroxy: 50.8 ng/mL (ref 30.0–100.0)

## 2022-11-04 ENCOUNTER — Ambulatory Visit (INDEPENDENT_AMBULATORY_CARE_PROVIDER_SITE_OTHER): Payer: Medicare HMO | Admitting: Internal Medicine

## 2022-11-04 ENCOUNTER — Encounter (INDEPENDENT_AMBULATORY_CARE_PROVIDER_SITE_OTHER): Payer: Self-pay | Admitting: Internal Medicine

## 2022-11-04 VITALS — BP 148/86 | HR 85 | Temp 98.2°F | Ht 67.0 in | Wt 239.0 lb

## 2022-11-04 DIAGNOSIS — R7303 Prediabetes: Secondary | ICD-10-CM

## 2022-11-04 DIAGNOSIS — I10 Essential (primary) hypertension: Secondary | ICD-10-CM | POA: Diagnosis not present

## 2022-11-04 DIAGNOSIS — E559 Vitamin D deficiency, unspecified: Secondary | ICD-10-CM | POA: Diagnosis not present

## 2022-11-04 DIAGNOSIS — E785 Hyperlipidemia, unspecified: Secondary | ICD-10-CM | POA: Diagnosis not present

## 2022-11-04 DIAGNOSIS — Z6837 Body mass index (BMI) 37.0-37.9, adult: Secondary | ICD-10-CM

## 2022-11-04 NOTE — Progress Notes (Signed)
Office: [phone]  /  Fax: [phone]  WEIGHT SUMMARY AND BIOMETRICS  Vitals Temp: 98.2 F (36.8 C) BP: (!) 148/86 Pulse Rate: 85 SpO2: 99 %   Anthropometric Measurements Height: '5\' 7"'$  (1.702 m) Weight: 239 lb (108.4 kg) BMI (Calculated): 37.42 Weight at Last Visit: 240 lb Weight Lost Since Last Visit: 1 lb Starting Weight: 240 lb Total Weight Loss (lbs): 1 lb (0.454 kg) Peak Weight: 276 lb   Body Composition  Body Fat %: 49.8 % Fat Mass (lbs): 119.2 lbs Muscle Mass (lbs): 114 lbs Total Body Water (lbs): 83.8 lbs Visceral Fat Rating : 16    HPI  Chief Complaint: OBESITY  Yesenia Montgomery is here to discuss her progress with her obesity treatment plan. She is unsure of the meal plan she is following and states she is following her eating plan approximately 100 % of the time. She states she is exercising 20-30 minutes 1-2 times per week.  Interval History:  Since last office visit she has lost 1 pound. She reports excellent adherence to reduced calorie nutritional plan. She has been working on not skipping meals and trying to reach protein target of 30 g per meal.  She is also been journaling and tracking her calories.  Most days she is staying under 1100 cal but not meeting protein goal. '[x]'$ Denies '[]'$ Reports problems with appetite and hunger signals.  '[x]'$ Denies '[]'$ Reports problems with satiety and satiation.  '[x]'$ Denies '[]'$ Reports problems with eating patterns and portion control.  '[x]'$ Denies '[]'$ Reports abnormal cravings Barriers identified none.   Pharmacotherapy for weight loss: She is currently taking no anti-obesity medication.   Weight promoting medications identified: Beta-blockers.  ASSESSMENT AND PLAN  TREATMENT PLAN FOR OBESITY:  Recommended Dietary Goals  [name] is currently in the action stage of change. As such, her goal is to continue weight management plan. She has agreed to: the Category 1 Plan.  Behavioral Intervention  We discussed the following  Behavioral Modification Strategies today: increasing lean protein intake, increasing vegetables, increasing water intake, work on meal planning and easy cooking plans, work on tracking and journaling calories using tracking App, reading food labels , and work on scanning foods using [employer] app to quickly assess food quality.  Additional resources provided today: Handout on healthy eating and balanced plate and Handout on complex carbohydrates and lean sources of protein  Recommended Physical Activity Goals  Yesenia Montgomery has been advised to work up to 150 minutes of moderate intensity aerobic activity a week and strengthening exercises 2-3 times per week for cardiovascular health, weight loss maintenance and preservation of muscle mass.   She has agreed to :  '[]'$  Continue current level of physical activity  '[]'$  Think about ways to increase physical activity '[]'$  Start strengthening exercises with a goal of 2-3 sessions a week  '[]'$  Start aerobic activity with a goal of 150 minutes a week at moderate intensity.  '[]'$  Increase the intensity, frequency or duration of strengthening exercises  '[]'$  Increase the intensity, frequency or duration of aerobic exercises   '[x]'$  Increase physical activity in their day and reduce sedentary time (increase NEAT). '[]'$  Work on scheduling and tracking physical activity.   Pharmacotherapy We discussed various medication options to help Yesenia Montgomery with her weight loss efforts and we both agreed to : continue with nutritional and behavioral strategies  ASSOCIATED CONDITIONS ADDRESSED TODAY  Pre-diabetes Assessment & Plan: Most recent A1c is  Lab Results  Component Value Date   HGBA1C 5.8 (H) 10/21/2022  She also has an elevated Homa  IR score consistent with insulin resistance.  Patient informed of disease state and risk of progression. This may contribute to abnormal cravings, fatigue and diabetes complications without having diabetes.   We reviewed treatment options which  include losing 7 to 10% of body weight, increasing physical activity to a 150 minutes a week of moderate intensity.She may also be a candidate for pharmacoprophylaxis with metformin or incretin mimetic.  She is not interested in metformin at present time she is concerned about medication side effects.    Vitamin D deficiency Assessment & Plan: Her vitamin D levels have improved from 28-50.8.  Denies side effects, she will continue vitamin D supplementation.   Hyperlipidemia, unspecified hyperlipidemia type Assessment & Plan: LDL is not at goal. Elevated LDL may be secondary to nutrition, genetics and spillover effect from excess adiposity. Recommended LDL goal is <70 to reduce the risk of fatty streaks and the progression to obstructive ASCVD in the future. Her 10 year risk is: The 10-year ASCVD risk score (Arnett DK, et al., 2019) is: 15.5%  Lab Results  Component Value Date   CHOL 216 (H) 10/21/2022   HDL 45 10/21/2022   LDLCALC 141 (H) 10/21/2022   TRIG 164 (H) 10/21/2022   CHOLHDL 5.3 (H) 03/04/2017    I reviewed cardiovascular risk with patient.  She would benefit from additional blood pressure lowering and possibly statin therapy.  She will like to continue with lifestyle changes.  Continue weight loss therapy, losing 10% or more of body weight may improve condition. Also advised to reduce saturated fats in diet to less than 10% of daily calories.  Repeat fasting lipid panel in 3 months and consider starting statin therapy if LDL cholesterol still elevated.      Essential hypertension Assessment & Plan: Her blood pressure has improved but is still not at goal.  I reviewed home blood pressure monitor log shows that systolics are still between 140s to 150s.  She is now experiencing lower extremity edema since starting to take amlodipine.  I had recommended that she scheduled a PCP appointment but she is waiting for her follow-up next month.  I explained to patient the importance of  scheduling an appointment over the next few days so her medications could be adjusted.  She may also benefit from switching to a weight neutral beta-blocker like Bystolic or carvedilol as her metabolism is slower than predicted.  She will contact her PCP and continue monitoring blood pressure.  I suspect her edema is from calcium channel blocker.   Class 2 severe obesity with serious comorbidity and body mass index (BMI) of 37.0 to 37.9 in adult, unspecified obesity type (HCC)     PHYSICAL EXAM:  Blood pressure (!) 148/86, pulse 85, temperature 98.2 F (36.8 C), height '5\' 7"'$  (1.702 m), weight 239 lb (108.4 kg), SpO2 99 %. Body mass index is 37.43 kg/m.  General: She is overweight, cooperative, alert, well developed, and in no acute distress. PSYCH: Has normal mood, affect and thought process.   HEENT: EOMI, sclerae are anicteric. Lungs: Normal breathing effort, no conversational dyspnea. Extremities: No edema.  Neurologic: No gross sensory or motor deficits. No tremors or fasciculations noted.    DIAGNOSTIC DATA REVIEWED:  BMET    Component Value Date/Time   NA 140 10/21/2022 1050   K 4.6 10/21/2022 1050   CL 100 10/21/2022 1050   CO2 26 10/21/2022 1050   GLUCOSE 97 10/21/2022 1050   GLUCOSE 110 (H) 06/04/2020 1435   BUN 16 10/21/2022  1050   CREATININE 0.97 10/21/2022 1050   CREATININE 0.90 03/04/2017 1404   CALCIUM 10.2 10/21/2022 1050   GFRNONAA >60 06/04/2020 1435   GFRNONAA 69 03/04/2017 1404   GFRAA >60 10/28/2017 0418   GFRAA 79 03/04/2017 1404   Lab Results  Component Value Date   HGBA1C 5.8 (H) 10/21/2022   HGBA1C 5.6 03/04/2017   Lab Results  Component Value Date   INSULIN 12.7 10/21/2022   Lab Results  Component Value Date   TSH 1.350 10/21/2022   CBC    Component Value Date/Time   WBC 5.9 06/04/2020 1435   RBC 4.46 06/04/2020 1435   HGB 13.1 06/04/2020 1435   HCT 41.6 06/04/2020 1435   PLT 269 06/04/2020 1435   MCV 93.3 06/04/2020 1435   MCH  29.4 06/04/2020 1435   MCHC 31.5 06/04/2020 1435   RDW 13.4 06/04/2020 1435   Iron Studies No results found for: "IRON", "TIBC", "FERRITIN", "IRONPCTSAT" Lipid Panel     Component Value Date/Time   CHOL 216 (H) 10/21/2022 1050   TRIG 164 (H) 10/21/2022 1050   HDL 45 10/21/2022 1050   CHOLHDL 5.3 (H) 03/04/2017 1404   VLDL 46 (H) 03/04/2017 1404   LDLCALC 141 (H) 10/21/2022 1050   Hepatic Function Panel     Component Value Date/Time   PROT 7.9 10/21/2022 1050   ALBUMIN 4.5 10/21/2022 1050   AST 24 10/21/2022 1050   ALT 20 10/21/2022 1050   ALKPHOS 74 10/21/2022 1050   BILITOT 0.5 10/21/2022 1050      Component Value Date/Time   TSH 1.350 10/21/2022 1050   Nutritional Lab Results  Component Value Date   VD25OH 50.8 10/21/2022   VD25OH 28 (L) 03/04/2017     Return in about 2 weeks (around 11/18/2022) for For Weight Mangement with Dr. Gerarda Fraction.Marland Kitchen She was informed of the importance of frequent follow up visits to maximize her success with intensive lifestyle modifications for her multiple health conditions.   ATTESTASTION STATEMENTS:  Reviewed by clinician on day of visit: allergies, medications, problem list, medical history, surgical history, family history, social history, and previous encounter notes.   Time spent on visit including pre-visit chart review and post-visit care and charting was 40 minutes.    Thomes Dinning, MD

## 2022-11-04 NOTE — Assessment & Plan Note (Addendum)
LDL is not at goal. Elevated LDL may be secondary to nutrition, genetics and spillover effect from excess adiposity. Recommended LDL goal is <70 to reduce the risk of fatty streaks and the progression to obstructive ASCVD in the future. Her 10 year risk is: The 10-year ASCVD risk score (Arnett DK, et al., 2019) is: 15.5%  Lab Results  Component Value Date   CHOL 216 (H) 10/21/2022   HDL 45 10/21/2022   LDLCALC 141 (H) 10/21/2022   TRIG 164 (H) 10/21/2022   CHOLHDL 5.3 (H) 03/04/2017    I reviewed cardiovascular risk with patient.  She would benefit from additional blood pressure lowering and possibly statin therapy.  She will like to continue with lifestyle changes.  Continue weight loss therapy, losing 10% or more of body weight may improve condition. Also advised to reduce saturated fats in diet to less than 10% of daily calories.  Repeat fasting lipid panel in 3 months and consider starting statin therapy if LDL cholesterol still elevated.

## 2022-11-04 NOTE — Assessment & Plan Note (Signed)
Her vitamin D levels have improved from 28-50.8.  Denies side effects, she will continue vitamin D supplementation.

## 2022-11-04 NOTE — Assessment & Plan Note (Signed)
Her blood pressure has improved but is still not at goal.  I reviewed home blood pressure monitor log shows that systolics are still between 140s to 150s.  She is now experiencing lower extremity edema since starting to take amlodipine.  I had recommended that she scheduled a PCP appointment but she is waiting for her follow-up next month.  I explained to patient the importance of scheduling an appointment over the next few days so her medications could be adjusted.  She may also benefit from switching to a weight neutral beta-blocker like Bystolic or carvedilol as her metabolism is slower than predicted.  She will contact her PCP and continue monitoring blood pressure.  I suspect her edema is from calcium channel blocker.

## 2022-11-04 NOTE — Assessment & Plan Note (Signed)
Most recent A1c is  Lab Results  Component Value Date   HGBA1C 5.8 (H) 10/21/2022  She also has an elevated Homa IR score consistent with insulin resistance.  Patient informed of disease state and risk of progression. This may contribute to abnormal cravings, fatigue and diabetes complications without having diabetes.   We reviewed treatment options which include losing 7 to 10% of body weight, increasing physical activity to a 150 minutes a week of moderate intensity.She may also be a candidate for pharmacoprophylaxis with metformin or incretin mimetic.  She is not interested in metformin at present time she is concerned about medication side effects.

## 2022-11-05 DIAGNOSIS — I10 Essential (primary) hypertension: Secondary | ICD-10-CM | POA: Diagnosis not present

## 2022-11-05 DIAGNOSIS — Z6837 Body mass index (BMI) 37.0-37.9, adult: Secondary | ICD-10-CM | POA: Diagnosis not present

## 2022-11-05 DIAGNOSIS — E782 Mixed hyperlipidemia: Secondary | ICD-10-CM | POA: Diagnosis not present

## 2022-11-05 DIAGNOSIS — Z79899 Other long term (current) drug therapy: Secondary | ICD-10-CM | POA: Diagnosis not present

## 2022-11-05 DIAGNOSIS — Z713 Dietary counseling and surveillance: Secondary | ICD-10-CM | POA: Diagnosis not present

## 2022-11-05 DIAGNOSIS — R600 Localized edema: Secondary | ICD-10-CM | POA: Diagnosis not present

## 2022-11-05 DIAGNOSIS — E669 Obesity, unspecified: Secondary | ICD-10-CM | POA: Diagnosis not present

## 2022-11-18 ENCOUNTER — Ambulatory Visit (INDEPENDENT_AMBULATORY_CARE_PROVIDER_SITE_OTHER): Payer: Medicare HMO | Admitting: Internal Medicine

## 2022-11-18 ENCOUNTER — Encounter (INDEPENDENT_AMBULATORY_CARE_PROVIDER_SITE_OTHER): Payer: Self-pay | Admitting: Internal Medicine

## 2022-11-18 VITALS — BP 150/75 | HR 63 | Temp 97.9°F | Ht 67.0 in | Wt 243.0 lb

## 2022-11-18 DIAGNOSIS — E559 Vitamin D deficiency, unspecified: Secondary | ICD-10-CM

## 2022-11-18 DIAGNOSIS — I10 Essential (primary) hypertension: Secondary | ICD-10-CM | POA: Diagnosis not present

## 2022-11-18 DIAGNOSIS — Z6837 Body mass index (BMI) 37.0-37.9, adult: Secondary | ICD-10-CM

## 2022-11-18 DIAGNOSIS — R7303 Prediabetes: Secondary | ICD-10-CM

## 2022-11-18 NOTE — Progress Notes (Signed)
Office: [phone]  /  Fax: [phone]  WEIGHT SUMMARY AND BIOMETRICS  Vitals Temp: 97.9 F (36.6 C) BP: (!) 150/75 Pulse Rate: 63 SpO2: 98 %   Anthropometric Measurements Height: 5\' 7"  (1.702 m) Weight: 243 lb (110.2 kg) BMI (Calculated): 38.05 Weight at Last Visit: 239lb Weight Gained Since Last Visit: 4 lb Starting Weight: 240 lb Total Weight Loss (lbs): 0 lb (0 kg) Peak Weight: 276 lb   Body Composition  Body Fat %: 50.5 % Fat Mass (lbs): 123 lbs Muscle Mass (lbs): 114.4 lbs Total Body Water (lbs): 87.6 lbs    HPI  Chief Complaint: OBESITY  Yesenia Montgomery is here to discuss her progress with her obesity treatment plan. She is on the the Category 1 Plan and states she is following her eating plan approximately 99 % of the time. She states she is exercising 45-60 minutes 3-4 times per week.  Interval History:  Since last office visit she has gained 4 pounds. She reports suboptimal adherence due to not getting the right amount of micronutrients see 24-hour recall She has been working on reading food labels, journaling and tracking calories, and begun to exercise [x] Denies [] Reports problems with appetite and hunger signals.  [x] Denies [] Reports problems with satiety and satiation.  [x] Denies [] Reports problems with eating patterns and portion control.  [x] Denies [] Reports abnormal cravings  24 hour recall: spinach / spring mix salad, dressing buttermilk ranch. Lunch: stew pigged feet, Dinner: yogurt Barriers identified having difficulty preparing healthy meals and predilection for pork as a source of protein .   Pharmacotherapy for weight loss: She is currently taking no anti-obesity medication.    ASSESSMENT AND PLAN  TREATMENT PLAN FOR OBESITY:  Recommended Dietary Goals  Yesenia Montgomery is currently in the action stage of change. As such, her goal is to continue weight management plan. She has agreed to: continue current plan  Behavioral Intervention  We  discussed the following Behavioral Modification Strategies today: increasing lean protein intake, increasing vegetables, increasing fiber rich foods, increasing water intake, work on meal planning and easy cooking plans, work on tracking and journaling calories using tracking App, reading food labels , planning for success, work on scanning foods using [employer] app to quickly assess food quality, and keeping healthy foods at home.  Additional resources provided today:  Introduced use of my fitness pal, tracking and journaling and also the use of skinny taste for recipe ideas.  She will consider starting to make 1 pot recipes  Recommended Physical Activity Goals  Yesenia Montgomery has been advised to work up to 150 minutes of moderate intensity aerobic activity a week and strengthening exercises 2-3 times per week for cardiovascular health, weight loss maintenance and preservation of muscle mass.   She has agreed to :  Continue current level of physical activity   Pharmacotherapy We discussed various medication options to help Yesenia Montgomery with her weight loss efforts and we both agreed to : continue with nutritional and behavioral strategies  ASSOCIATED CONDITIONS ADDRESSED TODAY  Pre-diabetes Assessment & Plan: Most recent A1c is  Lab Results  Component Value Date   HGBA1C 5.8 (H) 10/21/2022  She also has an elevated Homa IR score consistent with insulin resistance.  Patient informed of disease state and risk of progression. This may contribute to abnormal cravings, fatigue and diabetes complications without having diabetes.   We reviewed treatment options which include losing 7 to 10% of body weight, increasing physical activity to a 150 minutes a week of moderate intensity.She may also be a  candidate for pharmacoprophylaxis with metformin or incretin mimetic.  She is not interested in metformin at present time she is concerned about medication side effects.    Vitamin D deficiency  Essential  hypertension Assessment & Plan: Her medications have been adjusted by her primary care doctor.  She has been logging her blood pressures and at home they are staying between [id number] systolics which is a marked improvement.  She is tolerating new regimen without any adverse effects.  Her blood pressure was elevated today but she is in pain due to working out.  Her blood pressures at home have been well-controlled  I recommend she continue to monitor.  She will continue current regimen and monitor for orthostasis.  She is also to maintain adequate hydration and avoid NSAIDs.   Class 2 severe obesity with serious comorbidity and body mass index (BMI) of 37.0 to 37.9 in adult, unspecified obesity type (HCC)     PHYSICAL EXAM:  Blood pressure (!) 150/75, pulse 63, temperature 97.9 F (36.6 C), height 5\' 7"  (1.702 m), weight 243 lb (110.2 kg), SpO2 98 %. Body mass index is 38.06 kg/m.  General: She is overweight, cooperative, alert, well developed, and in no acute distress. PSYCH: Has normal mood, affect and thought process.   HEENT: EOMI, sclerae are anicteric. Lungs: Normal breathing effort, no conversational dyspnea. Extremities: No edema.  Neurologic: No gross sensory or motor deficits. No tremors or fasciculations noted.    DIAGNOSTIC DATA REVIEWED:  BMET    Component Value Date/Time   NA 140 10/21/2022 1050   K 4.6 10/21/2022 1050   CL 100 10/21/2022 1050   CO2 26 10/21/2022 1050   GLUCOSE 97 10/21/2022 1050   GLUCOSE 110 (H) 06/04/2020 1435   BUN 16 10/21/2022 1050   CREATININE 0.97 10/21/2022 1050   CREATININE 0.90 03/04/2017 1404   CALCIUM 10.2 10/21/2022 1050   GFRNONAA >60 06/04/2020 1435   GFRNONAA 69 03/04/2017 1404   GFRAA >60 10/28/2017 0418   GFRAA 79 03/04/2017 1404   Lab Results  Component Value Date   HGBA1C 5.8 (H) 10/21/2022   HGBA1C 5.6 03/04/2017   Lab Results  Component Value Date   INSULIN 12.7 10/21/2022   Lab Results  Component Value Date    TSH 1.350 10/21/2022   CBC    Component Value Date/Time   WBC 5.9 06/04/2020 1435   RBC 4.46 06/04/2020 1435   HGB 13.1 06/04/2020 1435   HCT 41.6 06/04/2020 1435   PLT 269 06/04/2020 1435   MCV 93.3 06/04/2020 1435   MCH 29.4 06/04/2020 1435   MCHC 31.5 06/04/2020 1435   RDW 13.4 06/04/2020 1435   Iron Studies No results found for: "IRON", "TIBC", "FERRITIN", "IRONPCTSAT" Lipid Panel     Component Value Date/Time   CHOL 216 (H) 10/21/2022 1050   TRIG 164 (H) 10/21/2022 1050   HDL 45 10/21/2022 1050   CHOLHDL 5.3 (H) 03/04/2017 1404   VLDL 46 (H) 03/04/2017 1404   LDLCALC 141 (H) 10/21/2022 1050   Hepatic Function Panel     Component Value Date/Time   PROT 7.9 10/21/2022 1050   ALBUMIN 4.5 10/21/2022 1050   AST 24 10/21/2022 1050   ALT 20 10/21/2022 1050   ALKPHOS 74 10/21/2022 1050   BILITOT 0.5 10/21/2022 1050      Component Value Date/Time   TSH 1.350 10/21/2022 1050   Nutritional Lab Results  Component Value Date   VD25OH 50.8 10/21/2022   VD25OH 28 (L) 03/04/2017  Return in about 2 weeks (around 12/02/2022) for For Weight Mangement with Dr. Gerarda Fraction.Marland Kitchen She was informed of the importance of frequent follow up visits to maximize her success with intensive lifestyle modifications for her multiple health conditions.   ATTESTASTION STATEMENTS:  Reviewed by clinician on day of visit: allergies, medications, problem list, medical history, surgical history, family history, social history, and previous encounter notes.   I have spent 40 minutes in the care of the patient today including: preparing to see patient (e.g. review and interpretation of tests, old notes ), counseling and educating the patient, referring and communicating with other healthcare professionals, and documenting clinical information in the electronic or other health care record   Thomes Dinning, MD

## 2022-11-18 NOTE — Assessment & Plan Note (Signed)
Her medications have been adjusted by her primary care doctor.  She has been logging her blood pressures and at home they are staying between [id number] systolics which is a marked improvement.  She is tolerating new regimen without any adverse effects.  Her blood pressure was elevated today but she is in pain due to working out.  Her blood pressures at home have been well-controlled  I recommend she continue to monitor.  She will continue current regimen and monitor for orthostasis.  She is also to maintain adequate hydration and avoid NSAIDs.

## 2022-11-18 NOTE — Addendum Note (Signed)
Addended by: Glendale Chard on: 11/18/2022 01:09 PM   Modules accepted: Level of Service

## 2022-11-18 NOTE — Assessment & Plan Note (Signed)
Most recent A1c is  Lab Results  Component Value Date   HGBA1C 5.8 (H) 10/21/2022  She also has an elevated Homa IR score consistent with insulin resistance.  Patient informed of disease state and risk of progression. This may contribute to abnormal cravings, fatigue and diabetes complications without having diabetes.   We reviewed treatment options which include losing 7 to 10% of body weight, increasing physical activity to a 150 minutes a week of moderate intensity.She may also be a candidate for pharmacoprophylaxis with metformin or incretin mimetic.  She is not interested in metformin at present time she is concerned about medication side effects.  

## 2022-11-19 ENCOUNTER — Ambulatory Visit (INDEPENDENT_AMBULATORY_CARE_PROVIDER_SITE_OTHER): Payer: Medicare HMO | Admitting: Internal Medicine

## 2022-12-02 ENCOUNTER — Ambulatory Visit (INDEPENDENT_AMBULATORY_CARE_PROVIDER_SITE_OTHER): Payer: Medicare HMO | Admitting: Internal Medicine

## 2022-12-02 ENCOUNTER — Encounter (INDEPENDENT_AMBULATORY_CARE_PROVIDER_SITE_OTHER): Payer: Self-pay | Admitting: Internal Medicine

## 2022-12-02 VITALS — BP 134/82 | HR 65 | Temp 98.0°F | Ht 67.0 in | Wt 243.0 lb

## 2022-12-02 DIAGNOSIS — R7303 Prediabetes: Secondary | ICD-10-CM

## 2022-12-02 DIAGNOSIS — I10 Essential (primary) hypertension: Secondary | ICD-10-CM

## 2022-12-02 DIAGNOSIS — Z6837 Body mass index (BMI) 37.0-37.9, adult: Secondary | ICD-10-CM | POA: Diagnosis not present

## 2022-12-02 MED ORDER — METFORMIN HCL ER 500 MG PO TB24: 500.0000 mg | ORAL_TABLET | Freq: Every day | ORAL | 0 refills | Status: DC

## 2022-12-02 MED ORDER — AMLODIPINE BESYLATE 5 MG PO TABS: 5.0000 mg | ORAL_TABLET | Freq: Every day | ORAL | 0 refills | Status: DC

## 2022-12-02 NOTE — Assessment & Plan Note (Signed)
Has been monitoring at home, bp improved significantly on losartan, nebivolol and amlodipine without SE. Advised to switch medication refills for amlodipine to PCP for continuity. Continue current regimen with weight loss therapy.

## 2022-12-02 NOTE — Assessment & Plan Note (Signed)
Most recent A1c is  Lab Results  Component Value Date   HGBA1C 5.8 (H) 10/21/2022  She also has an elevated Homa IR score consistent with insulin resistance.  Patient informed of disease state and risk of progression. This may contribute to abnormal cravings, fatigue and diabetes complications without having diabetes.   We reviewed treatment options which include losing 7 to 10% of body weight, increasing physical activity to a 150 minutes a week of moderate intensity. After discussion of benefits and SE will start metformin XR 500 once a day.

## 2022-12-02 NOTE — Progress Notes (Signed)
Office: [phone]  /  Fax: [phone]  WEIGHT SUMMARY AND BIOMETRICS  Vitals Temp: 98 F (36.7 C) BP: 134/82 Pulse Rate: 65 SpO2: 99 %   Anthropometric Measurements Height: 5\' 7"  (1.702 m) Weight: 243 lb (110.2 kg) BMI (Calculated): 38.05 Weight at Last Visit: 243 lb Weight Lost Since Last Visit: 0 lb Weight Gained Since Last Visit: 0 lb Starting Weight: 240 lb Total Weight Loss (lbs): 0 lb (0 kg) Peak Weight: 276 lb   Body Composition  Body Fat %: 50.4 % Fat Mass (lbs): 122.6 lbs Muscle Mass (lbs): 114.6 lbs Total Body Water (lbs): 89.6 lbs    HPI  Chief Complaint: OBESITY  Yesenia Montgomery is here to discuss her progress with her obesity treatment plan. She is on the the Category 1 Plan and states she is following her eating plan approximately 75 % of the time. She states she is exercising 30-45 minutes 4-5 times per week.  Interval History:  Since last office visit she has remained weight neutral. She reports good adherence to reduced calorie nutritional plan.  I reviewed information on tracking app she has been staying at or under 1100 cal most days but is averaging about 68 g of protein per day.  Rest of her calories are coming mostly from carbs and fats. She has been working on reading food labels, not skipping meals, eating more fruits, eating more vegetables, journaling and tracking calories, and scanning foods using [employer] app/ Denies problems with appetite and hunger signals.  Denies problems with satiety and satiation.  Denies problems with eating patterns and portion control.  Denies abnormal cravings. Denies feeling deprived or restricted.   Barriers identified:  Lack of response to calorie reduction .   Pharmacotherapy for weight loss: She is currently taking no anti-obesity medication.    ASSESSMENT AND PLAN  TREATMENT PLAN FOR OBESITY:  Recommended Dietary Goals  Taniyah is currently in the action stage of change. As such, her goal is to continue  weight management plan. She has agreed to: continue current plan  Behavioral Intervention  We discussed the following Behavioral Modification Strategies today: increasing lean protein intake, decreasing simple carbohydrates , work on tracking and journaling calories using tracking application, reading food labels , and decreasing saturated fats to less than 10% of daily calories.  Additional resources provided today: None  Recommended Physical Activity Goals  Josslynn has been advised to work up to 150 minutes of moderate intensity aerobic activity a week and strengthening exercises 2-3 times per week for cardiovascular health, weight loss maintenance and preservation of muscle mass.   She has agreed to :  Increase the intensity, frequency or duration of strengthening exercises  and Increase the intensity, frequency or duration of aerobic exercises    Pharmacotherapy We discussed various medication options to help Robecca with her weight loss efforts and we both agreed to :  After discussion of benefits and side effect she will be started on metformin XR 500 mg once a day for incretin effect and primary indication of diabetes prevention  ASSOCIATED CONDITIONS ADDRESSED TODAY  Pre-diabetes Assessment & Plan: Most recent A1c is  Lab Results  Component Value Date   HGBA1C 5.8 (H) 10/21/2022  She also has an elevated Homa IR score consistent with insulin resistance.  Patient informed of disease state and risk of progression. This may contribute to abnormal cravings, fatigue and diabetes complications without having diabetes.   We reviewed treatment options which include losing 7 to 10% of body weight, increasing  physical activity to a 150 minutes a week of moderate intensity. After discussion of benefits and SE will start metformin XR 500 once a day.  Orders: -     metFORMIN HCl ER; Take 1 tablet (500 mg total) by mouth daily with breakfast.  Dispense: 30 tablet; Refill: 0  Essential  hypertension Assessment & Plan: Has been monitoring at home, bp improved significantly on losartan, nebivolol and amlodipine without SE. Advised to switch medication refills for amlodipine to PCP for continuity. Continue current regimen with weight loss therapy.   Orders: -     amLODIPine Besylate; Take 1 tablet (5 mg total) by mouth daily.  Dispense: 90 tablet; Refill: 0  Class 2 severe obesity with serious comorbidity and body mass index (BMI) of 37.0 to 37.9 in adult, unspecified obesity type     PHYSICAL EXAM:  Blood pressure 134/82, pulse 65, temperature 98 F (36.7 C), height 5\' 7"  (1.702 m), weight 243 lb (110.2 kg), SpO2 99 %. Body mass index is 38.06 kg/m.  General: She is overweight, cooperative, alert, well developed, and in no acute distress. PSYCH: Has normal mood, affect and thought process.   HEENT: EOMI, sclerae are anicteric. Lungs: Normal breathing effort, no conversational dyspnea. Extremities: No edema.  Neurologic: No gross sensory or motor deficits. No tremors or fasciculations noted.    DIAGNOSTIC DATA REVIEWED:  BMET    Component Value Date/Time   NA 140 10/21/2022 1050   K 4.6 10/21/2022 1050   CL 100 10/21/2022 1050   CO2 26 10/21/2022 1050   GLUCOSE 97 10/21/2022 1050   GLUCOSE 110 (H) 06/04/2020 1435   BUN 16 10/21/2022 1050   CREATININE 0.97 10/21/2022 1050   CREATININE 0.90 03/04/2017 1404   CALCIUM 10.2 10/21/2022 1050   GFRNONAA >60 06/04/2020 1435   GFRNONAA 69 03/04/2017 1404   GFRAA >60 10/28/2017 0418   GFRAA 79 03/04/2017 1404   Lab Results  Component Value Date   HGBA1C 5.8 (H) 10/21/2022   HGBA1C 5.6 03/04/2017   Lab Results  Component Value Date   INSULIN 12.7 10/21/2022   Lab Results  Component Value Date   TSH 1.350 10/21/2022   CBC    Component Value Date/Time   WBC 5.9 06/04/2020 1435   RBC 4.46 06/04/2020 1435   HGB 13.1 06/04/2020 1435   HCT 41.6 06/04/2020 1435   PLT 269 06/04/2020 1435   MCV 93.3  06/04/2020 1435   MCH 29.4 06/04/2020 1435   MCHC 31.5 06/04/2020 1435   RDW 13.4 06/04/2020 1435   Iron Studies No results found for: "IRON", "TIBC", "FERRITIN", "IRONPCTSAT" Lipid Panel     Component Value Date/Time   CHOL 216 (H) 10/21/2022 1050   TRIG 164 (H) 10/21/2022 1050   HDL 45 10/21/2022 1050   CHOLHDL 5.3 (H) 03/04/2017 1404   VLDL 46 (H) 03/04/2017 1404   LDLCALC 141 (H) 10/21/2022 1050   Hepatic Function Panel     Component Value Date/Time   PROT 7.9 10/21/2022 1050   ALBUMIN 4.5 10/21/2022 1050   AST 24 10/21/2022 1050   ALT 20 10/21/2022 1050   ALKPHOS 74 10/21/2022 1050   BILITOT 0.5 10/21/2022 1050      Component Value Date/Time   TSH 1.350 10/21/2022 1050   Nutritional Lab Results  Component Value Date   VD25OH 50.8 10/21/2022   VD25OH 28 (L) 03/04/2017     Return in about 3 weeks (around 12/23/2022) for For Weight Mangement with Dr. Gerarda Fraction.Marland Kitchen She was informed  of the importance of frequent follow up visits to maximize her success with intensive lifestyle modifications for her multiple health conditions.   ATTESTASTION STATEMENTS:  Reviewed by clinician on day of visit: allergies, medications, problem list, medical history, surgical history, family history, social history, and previous encounter notes.     Thomes Dinning, MD

## 2022-12-07 ENCOUNTER — Telehealth (INDEPENDENT_AMBULATORY_CARE_PROVIDER_SITE_OTHER): Payer: Self-pay | Admitting: Internal Medicine

## 2022-12-07 NOTE — Telephone Encounter (Signed)
4/8 patient is having a all the side effects from the medication metformin. Patient is wanting Dr. Rikki Spearing to call her to get advice. Erie Noe

## 2022-12-08 NOTE — Telephone Encounter (Signed)
Spoke to patient, she did d/c, only took for one day. She will discuss with Dr. Rikki Spearing at next office visit.

## 2022-12-18 DIAGNOSIS — K7689 Other specified diseases of liver: Secondary | ICD-10-CM | POA: Diagnosis not present

## 2022-12-18 DIAGNOSIS — E782 Mixed hyperlipidemia: Secondary | ICD-10-CM | POA: Diagnosis not present

## 2022-12-24 DIAGNOSIS — E782 Mixed hyperlipidemia: Secondary | ICD-10-CM | POA: Diagnosis not present

## 2022-12-24 DIAGNOSIS — M25562 Pain in left knee: Secondary | ICD-10-CM | POA: Diagnosis not present

## 2022-12-24 DIAGNOSIS — R600 Localized edema: Secondary | ICD-10-CM | POA: Diagnosis not present

## 2022-12-24 DIAGNOSIS — Z0000 Encounter for general adult medical examination without abnormal findings: Secondary | ICD-10-CM | POA: Diagnosis not present

## 2022-12-24 DIAGNOSIS — E669 Obesity, unspecified: Secondary | ICD-10-CM | POA: Diagnosis not present

## 2022-12-24 DIAGNOSIS — R7989 Other specified abnormal findings of blood chemistry: Secondary | ICD-10-CM | POA: Diagnosis not present

## 2022-12-24 DIAGNOSIS — R7301 Impaired fasting glucose: Secondary | ICD-10-CM | POA: Diagnosis not present

## 2022-12-24 DIAGNOSIS — Z79899 Other long term (current) drug therapy: Secondary | ICD-10-CM | POA: Diagnosis not present

## 2022-12-24 DIAGNOSIS — R944 Abnormal results of kidney function studies: Secondary | ICD-10-CM | POA: Diagnosis not present

## 2022-12-24 DIAGNOSIS — I10 Essential (primary) hypertension: Secondary | ICD-10-CM | POA: Diagnosis not present

## 2022-12-24 DIAGNOSIS — Z6837 Body mass index (BMI) 37.0-37.9, adult: Secondary | ICD-10-CM | POA: Diagnosis not present

## 2022-12-24 DIAGNOSIS — M13811 Other specified arthritis, right shoulder: Secondary | ICD-10-CM | POA: Diagnosis not present

## 2022-12-29 DIAGNOSIS — M9903 Segmental and somatic dysfunction of lumbar region: Secondary | ICD-10-CM | POA: Diagnosis not present

## 2022-12-29 DIAGNOSIS — M62838 Other muscle spasm: Secondary | ICD-10-CM | POA: Diagnosis not present

## 2022-12-29 DIAGNOSIS — M5417 Radiculopathy, lumbosacral region: Secondary | ICD-10-CM | POA: Diagnosis not present

## 2022-12-29 DIAGNOSIS — M9901 Segmental and somatic dysfunction of cervical region: Secondary | ICD-10-CM | POA: Diagnosis not present

## 2022-12-29 DIAGNOSIS — M9902 Segmental and somatic dysfunction of thoracic region: Secondary | ICD-10-CM | POA: Diagnosis not present

## 2022-12-29 DIAGNOSIS — M5136 Other intervertebral disc degeneration, lumbar region: Secondary | ICD-10-CM | POA: Diagnosis not present

## 2022-12-29 DIAGNOSIS — M1712 Unilateral primary osteoarthritis, left knee: Secondary | ICD-10-CM | POA: Diagnosis not present

## 2022-12-29 DIAGNOSIS — M545 Low back pain, unspecified: Secondary | ICD-10-CM | POA: Diagnosis not present

## 2022-12-29 DIAGNOSIS — M25561 Pain in right knee: Secondary | ICD-10-CM | POA: Diagnosis not present

## 2022-12-29 DIAGNOSIS — M5412 Radiculopathy, cervical region: Secondary | ICD-10-CM | POA: Diagnosis not present

## 2022-12-29 DIAGNOSIS — R293 Abnormal posture: Secondary | ICD-10-CM | POA: Diagnosis not present

## 2022-12-29 DIAGNOSIS — M2351 Chronic instability of knee, right knee: Secondary | ICD-10-CM | POA: Diagnosis not present

## 2022-12-29 DIAGNOSIS — M2352 Chronic instability of knee, left knee: Secondary | ICD-10-CM | POA: Diagnosis not present

## 2022-12-29 DIAGNOSIS — M9905 Segmental and somatic dysfunction of pelvic region: Secondary | ICD-10-CM | POA: Diagnosis not present

## 2022-12-29 DIAGNOSIS — M5416 Radiculopathy, lumbar region: Secondary | ICD-10-CM | POA: Diagnosis not present

## 2022-12-29 DIAGNOSIS — M1711 Unilateral primary osteoarthritis, right knee: Secondary | ICD-10-CM | POA: Diagnosis not present

## 2022-12-29 DIAGNOSIS — M5030 Other cervical disc degeneration, unspecified cervical region: Secondary | ICD-10-CM | POA: Diagnosis not present

## 2022-12-29 DIAGNOSIS — M25562 Pain in left knee: Secondary | ICD-10-CM | POA: Diagnosis not present

## 2022-12-29 DIAGNOSIS — M9906 Segmental and somatic dysfunction of lower extremity: Secondary | ICD-10-CM | POA: Diagnosis not present

## 2022-12-31 ENCOUNTER — Ambulatory Visit (INDEPENDENT_AMBULATORY_CARE_PROVIDER_SITE_OTHER): Payer: Medicare HMO | Admitting: Internal Medicine

## 2022-12-31 ENCOUNTER — Encounter (INDEPENDENT_AMBULATORY_CARE_PROVIDER_SITE_OTHER): Payer: Self-pay | Admitting: Internal Medicine

## 2022-12-31 VITALS — BP 136/84 | HR 64 | Temp 98.3°F | Ht 67.0 in | Wt 239.0 lb

## 2022-12-31 DIAGNOSIS — I10 Essential (primary) hypertension: Secondary | ICD-10-CM | POA: Diagnosis not present

## 2022-12-31 DIAGNOSIS — Z6837 Body mass index (BMI) 37.0-37.9, adult: Secondary | ICD-10-CM | POA: Diagnosis not present

## 2022-12-31 DIAGNOSIS — E7800 Pure hypercholesterolemia, unspecified: Secondary | ICD-10-CM | POA: Diagnosis not present

## 2022-12-31 NOTE — Assessment & Plan Note (Signed)
I reviewed lipid profile from [facility] her LDL cholesterol has improved significantly from 141 is down to 69 had triglycerides have also normalized.  She is taking rosuvastatin without any adverse effects I recommend she continue medication for cardiovascular risk reduction.

## 2022-12-31 NOTE — Assessment & Plan Note (Signed)
Has been monitoring at home, bp improved significantly on losartan, nebivolol and amlodipine without SE. Continue current regimen.  Continue monitoring 3 times a week monitor for orthostasis while losing weight.

## 2022-12-31 NOTE — Progress Notes (Signed)
Office: [phone]  /  Fax: [phone]  WEIGHT SUMMARY AND BIOMETRICS  Vitals Temp: 98.3 F (36.8 C) BP: 136/84 Pulse Rate: 64 SpO2: 98 %   Anthropometric Measurements Height: 5\' 7"  (1.702 m) Weight: 239 lb (108.4 kg) BMI (Calculated): 37.42 Weight at Last Visit: 243 lb Weight Lost Since Last Visit: 4 lb Starting Weight: 240 lb Total Weight Loss (lbs): 1 lb (0.454 kg) Peak Weight: 276 lb   Body Composition  Body Fat %: 49.4 % Fat Mass (lbs): 118.2 lbs Muscle Mass (lbs): 115 lbs Total Body Water (lbs): 87.8 lbs Visceral Fat Rating : 16    No data recorded Today's Visit #: 5  Starting Date: 10/21/22   HPI  Chief Complaint: OBESITY  Yesenia Montgomery is here to discuss her progress with her obesity treatment plan. She is on the the Category 1 Plan and states she is following her eating plan approximately 100 % of the time. She states she is exercising 30-60 minutes 5-6 times per week.  Interval History:  Since last office visit she has lost 4 pounds. She reports good adherence to reduced calorie nutritional plan. She has been working on reading food labels, increasing protein intake at every meal, drinking more water, journaling and tracking calories, and continues to exercise.  She has been staying under 1100 cal and on most weeks is hitting her protein goal of 90 g.  This is an improvement from previous readings.  Last office visit we had also started her on metformin unfortunately she developed gastrointestinal side effects the medication was discontinued after the first 2 dosages.  Symptoms have resolved Denies problems with appetite and hunger signals.  Denies problems with satiety and satiation.  Denies problems with eating patterns and portion control.  Denies abnormal cravings. Denies feeling deprived or restricted.   Barriers identified: none.   Pharmacotherapy for weight loss: She is currently taking no anti-obesity medication.    ASSESSMENT AND  PLAN  TREATMENT PLAN FOR OBESITY:  Recommended Dietary Goals  Yesenia Montgomery is currently in the action stage of change. As such, her goal is to continue weight management plan. She has agreed to: continue current plan  Behavioral Intervention  We discussed the following Behavioral Modification Strategies today: increasing lean protein intake, decreasing simple carbohydrates , increasing vegetables, increasing lower glycemic fruits, increasing water intake, continue to practice mindfulness when eating, and planning for success.  Additional resources provided today: None  Recommended Physical Activity Goals  Yesenia Montgomery has been advised to work up to 150 minutes of moderate intensity aerobic activity a week and strengthening exercises 2-3 times per week for cardiovascular health, weight loss maintenance and preservation of muscle mass.   She has agreed to :  Continue current level of physical activity   Pharmacotherapy We discussed various medication options to help Yesenia Montgomery with her weight loss efforts and we both agreed to : continue with nutritional and behavioral strategies and had adverse effects to metformin.  GLP-1 medications are cost prohibitive.  She has contraindications to phentermine due to labile hypertension.  She may be a candidate for topiramate.  ASSOCIATED CONDITIONS ADDRESSED TODAY  Essential hypertension Assessment & Plan: Has been monitoring at home, bp improved significantly on losartan, nebivolol and amlodipine without SE. Continue current regimen.  Continue monitoring 3 times a week monitor for orthostasis while losing weight.   Pure hypercholesterolemia Assessment & Plan: I reviewed lipid profile from [facility] her LDL cholesterol has improved significantly from 141 is down to 69 had triglycerides have also normalized.  She is taking rosuvastatin without any adverse effects I recommend she continue medication for cardiovascular risk reduction.   Class 2 severe obesity with  serious comorbidity and body mass index (BMI) of 37.0 to 37.9 in adult, unspecified obesity type (HCC)    PHYSICAL EXAM:  Blood pressure 136/84, pulse 64, temperature 98.3 F (36.8 C), height 5\' 7"  (1.702 m), weight 239 lb (108.4 kg), SpO2 98 %. Body mass index is 37.43 kg/m.  General: She is overweight, cooperative, alert, well developed, and in no acute distress. PSYCH: Has normal mood, affect and thought process.   HEENT: EOMI, sclerae are anicteric. Lungs: Normal breathing effort, no conversational dyspnea. Extremities: No edema.  Neurologic: No gross sensory or motor deficits. No tremors or fasciculations noted.    DIAGNOSTIC DATA REVIEWED:  BMET    Component Value Date/Time   NA 140 10/21/2022 1050   K 4.6 10/21/2022 1050   CL 100 10/21/2022 1050   CO2 26 10/21/2022 1050   GLUCOSE 97 10/21/2022 1050   GLUCOSE 110 (H) 06/04/2020 1435   BUN 16 10/21/2022 1050   CREATININE 0.97 10/21/2022 1050   CREATININE 0.90 03/04/2017 1404   CALCIUM 10.2 10/21/2022 1050   GFRNONAA >60 06/04/2020 1435   GFRNONAA 69 03/04/2017 1404   GFRAA >60 10/28/2017 0418   GFRAA 79 03/04/2017 1404   Lab Results  Component Value Date   HGBA1C 5.8 (H) 10/21/2022   HGBA1C 5.6 03/04/2017   Lab Results  Component Value Date   INSULIN 12.7 10/21/2022   Lab Results  Component Value Date   TSH 1.350 10/21/2022   CBC    Component Value Date/Time   WBC 5.9 06/04/2020 1435   RBC 4.46 06/04/2020 1435   HGB 13.1 06/04/2020 1435   HCT 41.6 06/04/2020 1435   PLT 269 06/04/2020 1435   MCV 93.3 06/04/2020 1435   MCH 29.4 06/04/2020 1435   MCHC 31.5 06/04/2020 1435   RDW 13.4 06/04/2020 1435   Iron Studies No results found for: "IRON", "TIBC", "FERRITIN", "IRONPCTSAT" Lipid Panel     Component Value Date/Time   CHOL 216 (H) 10/21/2022 1050   TRIG 164 (H) 10/21/2022 1050   HDL 45 10/21/2022 1050   CHOLHDL 5.3 (H) 03/04/2017 1404   VLDL 46 (H) 03/04/2017 1404   LDLCALC 141 (H)  10/21/2022 1050   Hepatic Function Panel     Component Value Date/Time   PROT 7.9 10/21/2022 1050   ALBUMIN 4.5 10/21/2022 1050   AST 24 10/21/2022 1050   ALT 20 10/21/2022 1050   ALKPHOS 74 10/21/2022 1050   BILITOT 0.5 10/21/2022 1050      Component Value Date/Time   TSH 1.350 10/21/2022 1050   Nutritional Lab Results  Component Value Date   VD25OH 50.8 10/21/2022   VD25OH 28 (L) 03/04/2017     No follow-ups on file.Marland Kitchen She was informed of the importance of frequent follow up visits to maximize her success with intensive lifestyle modifications for her multiple health conditions.   ATTESTASTION STATEMENTS:  Reviewed by clinician on day of visit: allergies, medications, problem list, medical history, surgical history, family history, social history, and previous encounter notes.     Worthy Rancher, MD

## 2023-01-28 ENCOUNTER — Ambulatory Visit (INDEPENDENT_AMBULATORY_CARE_PROVIDER_SITE_OTHER): Payer: Medicare HMO | Admitting: Family Medicine

## 2023-01-28 ENCOUNTER — Encounter (INDEPENDENT_AMBULATORY_CARE_PROVIDER_SITE_OTHER): Payer: Self-pay | Admitting: Family Medicine

## 2023-01-28 VITALS — BP 162/81 | HR 56 | Temp 97.7°F | Ht 67.0 in | Wt 244.0 lb

## 2023-01-28 DIAGNOSIS — Z6838 Body mass index (BMI) 38.0-38.9, adult: Secondary | ICD-10-CM

## 2023-01-28 DIAGNOSIS — R7303 Prediabetes: Secondary | ICD-10-CM

## 2023-01-28 DIAGNOSIS — I10 Essential (primary) hypertension: Secondary | ICD-10-CM | POA: Diagnosis not present

## 2023-01-28 NOTE — Progress Notes (Signed)
Yesenia Montgomery, D.O.  ABFM, ABOM Specializing in Clinical Bariatric Medicine  Office located at: [address] [place], [place]  [address]     Assessment and Plan:   Essential hypertension Assessment: Condition is not optimized.  Last 3 blood pressure readings in our office are as follows: BP Readings from Last 3 Encounters:  01/28/23 (!) 162/81  12/31/22 136/84  12/02/22 134/82  - Patient endorses that her high blood pressure today is likely due to stressful events that occurred earlier in the day.  - She reports that her blood pressure is in the 120s-130s/70s-80s at home.  - No issues with Norvasc 5 mg daily and Bystolic 10 mg daily. Denies any side effects.  Plan: - Continue with all antihypertensive medications.  - Continue with Prudent nutritional plan and low sodium diet, advance exercise as tolerated.   - We will continue to monitor closely alongside PCP/ specialists.  Pt reminded to also f/up with those individuals as instructed by them.  - We will continue to monitor symptoms as they relate to the her weight loss journey.    Pre-diabetes Assessment: Condition is not optimized. Lab Results  Component Value Date   HGBA1C 5.8 (H) 10/21/2022   HGBA1C 5.6 03/04/2017   INSULIN 12.7 10/21/2022  - Patient is not on any prediabetic medication. This is diet controlled. - Her hunger and cravings are controlled when eating on plan.   Plan  - Continue to decrease simple carbs/ sugars; increase fiber and proteins -> follow her meal plan.   - Yesenia Montgomery will continue to work on weight loss, exercise, via their meal plan we devised to help decrease the risk of progressing to diabetes.    TREATMENT PLAN FOR OBESITY: Class 2 severe obesity with serious comorbidity and body mass index (BMI) of 37.0 to 37.9 in adult, unspecified obesity type (HCC) Assessment:  Yesenia Montgomery is here to discuss her progress with her obesity treatment plan along with follow-up of her obesity  related diagnoses. See Medical Weight Management Flowsheet for complete bioelectrical impedance results.  Condition is not optimized. Biometric data was collected and reviewed with patient.   Since last office visit on 12/31/22 patient's  Muscle mass has decreased by 3 lb. Fat mass has increased by 8 lb. Total body water has decreased by 3.2 lb.  Counseling done on how various foods will affect these numbers and how to maximize success  Total lbs lost to date: + 4  Total weight loss percentage to date: + 1.64   Plan:  -  Had long discussion with patient that her decrease in muscle mass implies that she has not been eating the proper amounts of protein and that her increase in fat mass indicates that that she is above her daily caloric limit. Thus, I stressed the importance of journaling all her food accurately and increasing her lean protein intake.   - Continue the Category 1 meal plan and journaling 1100 calories and 80+ grams of protein daily.  Behavioral Intervention Additional resources provided today:  food journaling log handout Evidence-based interventions for health behavior change were utilized today including the discussion of self monitoring techniques, problem-solving barriers and SMART goal setting techniques.   Regarding patient's less desirable eating habits and patterns, we employed the technique of small changes.  Pt will specifically work on: journaling accurately and continuing current exercise regiment  for next visit.    Recommended Physical Activity Goals  Yesenia Montgomery has been advised to slowly work up  to 150 minutes of moderate intensity aerobic activity a week and strengthening exercises 2-3 times per week for cardiovascular health, weight loss maintenance and preservation of muscle mass.   She has agreed to Continue current level of physical activity    FOLLOW UP: Return in about 3 weeks (around 02/18/2023). She was informed of the importance of frequent follow up visits  to maximize her success with intensive lifestyle modifications for her multiple health conditions.   Subjective:   Chief complaint: Obesity Yesenia Montgomery is here to discuss her progress with her obesity treatment plan. She is on the the Category 1 Plan and keeping a food journal and adhering to recommended goals of 1100 calories and 80+ protein and states she is following her eating plan approximately 85-90% of the time. She states she is exercising 30-45 minutes 3-5 days per week.  Interval History:  Yesenia Montgomery is here for a follow up office visit. This is her first visit with me; She is a patient of Dr. Lina Sayre.   Since last office visit:   - Pt endorses that she mostly hit her protein and calorie goals when journaling.  - Reports that she does not eat out frequently.  - When eating on plan, her hunger and cravings are controlled.   Review of Systems:  Pertinent positives were addressed with patient today.  Weight Summary and Biometrics   Weight Lost Since Last Visit: 0  Weight Gained Since Last Visit: 5 lb    Vitals Temp: 97.7 F (36.5 C) BP: (!) 162/81 Pulse Rate: (!) 56 SpO2: 98 %   Anthropometric Measurements Height: 5\' 7"  (1.702 m) Weight: 244 lb (110.7 kg) BMI (Calculated): 38.21 Weight at Last Visit: 239 lb Weight Lost Since Last Visit: 0 Weight Gained Since Last Visit: 5 lb Starting Weight: 240 lb Peak Weight: 276 lb   Body Composition  Body Fat %: 51.7 % Fat Mass (lbs): 126.2 lbs Muscle Mass (lbs): 112 lbs Total Body Water (lbs): 91 lbs Visceral Fat Rating : 17   Other Clinical Data Fasting: No Labs: No Today's Visit #: 6 Starting Date: 10/21/22   Objective:   PHYSICAL EXAM: Blood pressure (!) 162/81, pulse (!) 56, temperature 97.7 F (36.5 C), height 5\' 7"  (1.702 m), weight 244 lb (110.7 kg), SpO2 98 %. Body mass index is 38.22 kg/m.  General: Well Developed, well nourished, and in no acute distress.  HEENT: Normocephalic,  atraumatic Skin: Warm and dry, cap RF less 2 sec, good turgor Chest:  Normal excursion, shape, no gross abn Respiratory: speaking in full sentences, no conversational dyspnea NeuroM-Sk: Ambulates w/o assistance, moves * 4 Psych: A and O *3, insight good, mood-full  DIAGNOSTIC DATA REVIEWED:  BMET    Component Value Date/Time   NA 140 10/21/2022 1050   K 4.6 10/21/2022 1050   CL 100 10/21/2022 1050   CO2 26 10/21/2022 1050   GLUCOSE 97 10/21/2022 1050   GLUCOSE 110 (H) 06/04/2020 1435   BUN 16 10/21/2022 1050   CREATININE 0.97 10/21/2022 1050   CREATININE 0.90 03/04/2017 1404   CALCIUM 10.2 10/21/2022 1050   GFRNONAA >60 06/04/2020 1435   GFRNONAA 69 03/04/2017 1404   GFRAA >60 10/28/2017 0418   GFRAA 79 03/04/2017 1404   Lab Results  Component Value Date   HGBA1C 5.8 (H) 10/21/2022   HGBA1C 5.6 03/04/2017   Lab Results  Component Value Date   INSULIN 12.7 10/21/2022   Lab Results  Component Value Date   TSH 1.350 10/21/2022  CBC    Component Value Date/Time   WBC 5.9 06/04/2020 1435   RBC 4.46 06/04/2020 1435   HGB 13.1 06/04/2020 1435   HCT 41.6 06/04/2020 1435   PLT 269 06/04/2020 1435   MCV 93.3 06/04/2020 1435   MCH 29.4 06/04/2020 1435   MCHC 31.5 06/04/2020 1435   RDW 13.4 06/04/2020 1435   Iron Studies No results found for: "IRON", "TIBC", "FERRITIN", "IRONPCTSAT" Lipid Panel     Component Value Date/Time   CHOL 216 (H) 10/21/2022 1050   TRIG 164 (H) 10/21/2022 1050   HDL 45 10/21/2022 1050   CHOLHDL 5.3 (H) 03/04/2017 1404   VLDL 46 (H) 03/04/2017 1404   LDLCALC 141 (H) 10/21/2022 1050   Hepatic Function Panel     Component Value Date/Time   PROT 7.9 10/21/2022 1050   ALBUMIN 4.5 10/21/2022 1050   AST 24 10/21/2022 1050   ALT 20 10/21/2022 1050   ALKPHOS 74 10/21/2022 1050   BILITOT 0.5 10/21/2022 1050      Component Value Date/Time   TSH 1.350 10/21/2022 1050   Nutritional Lab Results  Component Value Date   VD25OH 50.8  10/21/2022   VD25OH 28 (L) 03/04/2017    Attestations:   Reviewed by clinician on day of visit: allergies, medications, problem list, medical history, surgical history, family history, social history, and previous encounter notes.  I,Special Puri,acting as a Neurosurgeon for [employer], DO.,have documented all relevant documentation on the behalf of Thomasene Lot, DO,as directed by  Thomasene Lot, DO while in the presence of Thomasene Lot, DO.   I, Thomasene Lot, DO, have reviewed all documentation for this visit. The documentation on 01/28/23 for the exam, diagnosis, procedures, and orders are all accurate and complete.

## 2023-02-23 ENCOUNTER — Encounter (INDEPENDENT_AMBULATORY_CARE_PROVIDER_SITE_OTHER): Payer: Self-pay | Admitting: Internal Medicine

## 2023-02-23 ENCOUNTER — Ambulatory Visit (INDEPENDENT_AMBULATORY_CARE_PROVIDER_SITE_OTHER): Payer: Medicare HMO | Admitting: Internal Medicine

## 2023-02-23 VITALS — BP 137/82 | HR 64 | Temp 98.1°F | Ht 67.0 in | Wt 239.0 lb

## 2023-02-23 DIAGNOSIS — I10 Essential (primary) hypertension: Secondary | ICD-10-CM | POA: Diagnosis not present

## 2023-02-23 DIAGNOSIS — R948 Abnormal results of function studies of other organs and systems: Secondary | ICD-10-CM | POA: Diagnosis not present

## 2023-02-23 DIAGNOSIS — R7303 Prediabetes: Secondary | ICD-10-CM | POA: Diagnosis not present

## 2023-02-23 DIAGNOSIS — Z6837 Body mass index (BMI) 37.0-37.9, adult: Secondary | ICD-10-CM

## 2023-02-23 MED ORDER — AMLODIPINE BESYLATE 5 MG PO TABS: 5.0000 mg | ORAL_TABLET | Freq: Every day | ORAL | 0 refills | Status: DC

## 2023-02-23 NOTE — Assessment & Plan Note (Signed)
Most recent A1c is  Lab Results  Component Value Date   HGBA1C 5.8 (H) 10/21/2022   HGBA1C 5.6 03/04/2017    Patient aware of disease state and risk of progression. This may contribute to abnormal cravings, fatigue and diabetic complications without having diabetes.   We reviewed treatment options which includes losing 7 to 10% of body weight, increasing physical activity to a goal of 150 minutes a week at moderate intensity.  We tried metformin for pharmacoprophylaxis and Yesenia Montgomery did not tolerate medication.  Continue with nutritional and behavioral strategies.  Yesenia Montgomery may also be a candidate for incretin therapy if not cost prohibitive.

## 2023-02-23 NOTE — Progress Notes (Signed)
Office: [phone]  /  Fax: [phone]  WEIGHT SUMMARY AND BIOMETRICS  Vitals Temp: 98.1 F (36.7 C) BP: 137/82 Pulse Rate: 64 SpO2: 98 %   Anthropometric Measurements Height: 5\' 7"  (1.702 m) Weight: 239 lb (108.4 kg) BMI (Calculated): 37.42 Weight at Last Visit: 244 lb Weight Lost Since Last Visit: 5 lb Weight Gained Since Last Visit: 0 lb Starting Weight: 240 lb Total Weight Loss (lbs): 1 lb (0.454 kg) Peak Weight: 276 lb   Body Composition  Body Fat %: 49.4 % Fat Mass (lbs): 118.2 lbs Muscle Mass (lbs): 115 lbs Total Body Water (lbs): 85.2 lbs Visceral Fat Rating : 16    No data recorded Today's Visit #: 7  Starting Date: 10/21/22   HPI  Chief Complaint: OBESITY  Dutchess is here to discuss her progress with her obesity treatment plan. She is on the the Category 1 Plan and states she is following her eating plan approximately 100 % of the time. She states she is exercising 30-45 minutes 3-4 times per week.  Interval History:  Since last office visit she has lost 5 lb. She reports good adherence to reduced calorie nutritional plan. She has been working on not skipping meals, increasing protein intake at every meal, eating more fruits, eating more vegetables, and drinking more water.  She has been reading food labels and making healthy choices.  [facility]  Orixegenic Control: Denies problems with appetite and hunger signals.  Denies problems with satiety and satiation.  Denies problems with eating patterns and portion control.  Denies abnormal cravings. Denies feeling deprived or restricted.   Barriers identified: none.   Pharmacotherapy for weight loss: She is currently taking no anti-obesity medication.    ASSESSMENT AND PLAN  TREATMENT PLAN FOR OBESITY:  Recommended Dietary Goals  Dorthy is currently in the action stage of change. As such, her goal is to continue weight management  plan. She has agreed to: continue current plan  Behavioral Intervention  We discussed the following Behavioral Modification Strategies today: increasing lean protein intake, decreasing simple carbohydrates , increasing vegetables, increasing lower glycemic fruits, increasing fiber rich foods, increasing water intake, continue to practice mindfulness when eating, and planning for success.  Additional resources provided today: None  Recommended Physical Activity Goals  Jadyn has been advised to work up to 150 minutes of moderate intensity aerobic activity a week and strengthening exercises 2-3 times per week for cardiovascular health, weight loss maintenance and preservation of muscle mass.   She has agreed to :  Continue current level of physical activity   Pharmacotherapy We discussed various medication options to help Zohra with her weight loss efforts and we both agreed to : continue with nutritional and behavioral strategies  ASSOCIATED CONDITIONS ADDRESSED TODAY  Pre-diabetes Assessment & Plan: Most recent A1c is  Lab Results  Component Value Date   HGBA1C 5.8 (H) 10/21/2022   HGBA1C 5.6 03/04/2017    Patient aware of disease state and risk of progression. This may contribute to abnormal cravings, fatigue and diabetic complications without having diabetes.   We reviewed treatment options which includes losing 7 to 10% of body weight, increasing physical activity to a goal of 150 minutes a week at moderate intensity.  We tried metformin for pharmacoprophylaxis and she did not tolerate medication.  Continue with nutritional and behavioral strategies.  She may also be a candidate for incretin therapy if not cost prohibitive.  Essential hypertension Assessment & Plan: Has been monitoring at home, bp improved significantly on losartan, nebivolol and amlodipine without SE. blood pressure still slightly above goal of less than 130/80.  Continue current regimen.  She will  work with primary care team on treatment intensification.  Losing 10% of body weight may improve blood pressure control.  Orders: -     amLODIPine Besylate; Take 1 tablet (5 mg total) by mouth daily.  Dispense: 90 tablet; Refill: 0  Class 2 severe obesity with serious comorbidity and body mass index (BMI) of 37.0 to 37.9 in adult, unspecified obesity type (HCC)  Abnormal metabolism Assessment & Plan: Her IC was 1100 versus calculated 1717 kcal.  In the past she had been skipping meals, doing restrictive eating and was sedentary.  This has improved and she is now losing weight.  We have reviewed measures to increase metabolism.     PHYSICAL EXAM:  Blood pressure 137/82, pulse 64, temperature 98.1 F (36.7 C), height 5\' 7"  (1.702 m), weight 239 lb (108.4 kg), SpO2 98 %. Body mass index is 37.43 kg/m.  General: She is overweight, cooperative, alert, well developed, and in no acute distress. PSYCH: Has normal mood, affect and thought process.   HEENT: EOMI, sclerae are anicteric. Lungs: Normal breathing effort, no conversational dyspnea. Extremities: No edema.  Neurologic: No gross sensory or motor deficits. No tremors or fasciculations noted.    DIAGNOSTIC DATA REVIEWED:  BMET    Component Value Date/Time   NA 140 10/21/2022 1050   K 4.6 10/21/2022 1050   CL 100 10/21/2022 1050   CO2 26 10/21/2022 1050   GLUCOSE 97 10/21/2022 1050   GLUCOSE 110 (H) 06/04/2020 1435   BUN 16 10/21/2022 1050   CREATININE 0.97 10/21/2022 1050   CREATININE 0.90 03/04/2017 1404   CALCIUM 10.2 10/21/2022 1050   GFRNONAA >60 06/04/2020 1435   GFRNONAA 69 03/04/2017 1404   GFRAA >60 10/28/2017 0418   GFRAA 79 03/04/2017 1404   Lab Results  Component Value Date   HGBA1C 5.8 (H) 10/21/2022   HGBA1C 5.6 03/04/2017   Lab Results  Component Value Date   INSULIN 12.7 10/21/2022   Lab Results  Component Value Date   TSH 1.350 10/21/2022   CBC    Component Value Date/Time   WBC 5.9  06/04/2020 1435   RBC 4.46 06/04/2020 1435   HGB 13.1 06/04/2020 1435   HCT 41.6 06/04/2020 1435   PLT 269 06/04/2020 1435   MCV 93.3 06/04/2020 1435   MCH 29.4 06/04/2020 1435   MCHC 31.5 06/04/2020 1435   RDW 13.4 06/04/2020 1435   Iron Studies No results found for: "IRON", "TIBC", "FERRITIN", "IRONPCTSAT" Lipid Panel     Component Value Date/Time   CHOL 216 (H) 10/21/2022 1050   TRIG 164 (H) 10/21/2022 1050   HDL 45 10/21/2022 1050   CHOLHDL 5.3 (H) 03/04/2017 1404   VLDL 46 (H) 03/04/2017 1404   LDLCALC 141 (H) 10/21/2022 1050   Hepatic Function Panel     Component Value Date/Time   PROT 7.9 10/21/2022 1050   ALBUMIN 4.5 10/21/2022 1050   AST 24 10/21/2022 1050   ALT 20 10/21/2022 1050   ALKPHOS 74 10/21/2022 1050   BILITOT 0.5 10/21/2022 1050      Component Value Date/Time   TSH 1.350 10/21/2022 1050   Nutritional Lab Results  Component Value Date   VD25OH 50.8 10/21/2022   VD25OH 28 (L) 03/04/2017     Return in about 4 weeks (  around 03/23/2023) for For Weight Mangement with Dr. Rikki Spearing.Marland Kitchen She was informed of the importance of frequent follow up visits to maximize her success with intensive lifestyle modifications for her multiple health conditions.   ATTESTASTION STATEMENTS:  Reviewed by clinician on day of visit: allergies, medications, problem list, medical history, surgical history, family history, social history, and previous encounter notes.     Worthy Rancher, MD

## 2023-02-23 NOTE — Assessment & Plan Note (Signed)
Her IC was 1100 versus calculated 1717 kcal.  In the past she had been skipping meals, doing restrictive eating and was sedentary.  This has improved and she is now losing weight.  We have reviewed measures to increase metabolism.

## 2023-02-23 NOTE — Assessment & Plan Note (Signed)
Has been monitoring at home, bp improved significantly on losartan, nebivolol and amlodipine without SE. blood pressure still slightly above goal of less than 130/80.  Continue current regimen.  She will work with primary care team on treatment intensification.  Losing 10% of body weight may improve blood pressure control.

## 2023-03-25 ENCOUNTER — Encounter (INDEPENDENT_AMBULATORY_CARE_PROVIDER_SITE_OTHER): Payer: Self-pay | Admitting: Internal Medicine

## 2023-03-25 ENCOUNTER — Ambulatory Visit (INDEPENDENT_AMBULATORY_CARE_PROVIDER_SITE_OTHER): Payer: Medicare HMO | Admitting: Internal Medicine

## 2023-03-25 VITALS — BP 122/74 | HR 64 | Temp 98.1°F | Ht 67.0 in | Wt 243.0 lb

## 2023-03-25 DIAGNOSIS — R7303 Prediabetes: Secondary | ICD-10-CM | POA: Diagnosis not present

## 2023-03-25 DIAGNOSIS — Z6838 Body mass index (BMI) 38.0-38.9, adult: Secondary | ICD-10-CM | POA: Diagnosis not present

## 2023-03-25 DIAGNOSIS — R948 Abnormal results of function studies of other organs and systems: Secondary | ICD-10-CM | POA: Diagnosis not present

## 2023-03-25 DIAGNOSIS — I10 Essential (primary) hypertension: Secondary | ICD-10-CM | POA: Diagnosis not present

## 2023-03-25 NOTE — Assessment & Plan Note (Signed)
Her IC was 1100 versus calculated 1717 kcal.  In the past she had been skipping meals, doing restrictive eating and was sedentary.  This has improved and she is now losing weight.  We have reviewed measures to increase metabolism.

## 2023-03-25 NOTE — Assessment & Plan Note (Signed)
Has been monitoring at home, bp improved significantly on losartan, nebivolol and amlodipine without SE.Continue current regimen.  Losing 10% of body weight may improve blood pressure control.  She is doing a great job with lifestyle changes.  Advised to monitor for symptoms of orthostasis as she continues to make progress.

## 2023-03-25 NOTE — Progress Notes (Signed)
Office: [phone]  /  Fax: [phone]  WEIGHT SUMMARY AND BIOMETRICS  Vitals Temp: 98.1 F (36.7 C) BP: 122/74 Pulse Rate: 64 SpO2: 98 %   Anthropometric Measurements Height: 5\' 7"  (1.702 m) Weight: 243 lb (110.2 kg) BMI (Calculated): 38.05 Weight at Last Visit: 239 lb Weight Lost Since Last Visit: 0 lb Weight Gained Since Last Visit: 4 lb Starting Weight: 240 lb Total Weight Loss (lbs): 0 lb (0 kg) Peak Weight: 276 lb   Body Composition  Body Fat %: 49.9 % Fat Mass (lbs): 121.6 lbs Muscle Mass (lbs): 115.8 lbs Total Body Water (lbs): 88.6 lbs Visceral Fat Rating : 16    No data recorded Today's Visit #: 8  Starting Date: 10/21/22   HPI  Chief Complaint: OBESITY  Yesenia Montgomery is here to discuss her progress with her obesity treatment plan. She is on the the Category 1 Plan and states she is following her eating plan approximately 100 % of the time. She states she is exercising 30-120 minutes 4 times per week.  Interval History:  Since last office visit she has gained 4 lbs. Tracking and journaling staying under 1100, protein on the low side probably representing 15 to 20% of calories. She reports good adherence to reduced calorie nutritional plan. She has been working on not skipping meals, eating more fruits, eating more vegetables, drinking more water, continues to exercise, and working on meal prepping  Orixegenic Control: Denies problems with appetite and hunger signals.  Denies problems with satiety and satiation.  Denies problems with eating patterns and portion control.  Denies abnormal cravings. Denies feeling deprived or restricted.   Barriers identified: slow metabolism for age and menopause .   Pharmacotherapy for weight loss: She is currently taking no anti-obesity medication.    ASSESSMENT AND PLAN  TREATMENT PLAN FOR OBESITY:  Recommended Dietary Goals  Yesenia Montgomery is currently in the action stage of change. As such, her goal is to  continue weight management plan. She has agreed to: keep a food journal with a target of  1100 calories and 30-40 grams of protein per meal and continue current plan  Behavioral Intervention  We discussed the following Behavioral Modification Strategies today: increasing lean protein intake, decreasing simple carbohydrates , increasing vegetables, increasing lower glycemic fruits, increasing fiber rich foods, avoiding skipping meals, increasing water intake, and planning for success.  Additional resources provided today:  How to boost metabolism  Recommended Physical Activity Goals  Yesenia Montgomery has been advised to work up to 150 minutes of moderate intensity aerobic activity a week and strengthening exercises 2-3 times per week for cardiovascular health, weight loss maintenance and preservation of muscle mass.   She has agreed to :  Continue current level of physical activity with emphasis on strengthening exercises.  Pharmacotherapy We discussed various medication options to help Yesenia Montgomery with her weight loss efforts and we both agreed to : continue with nutritional and behavioral strategies  ASSOCIATED CONDITIONS ADDRESSED TODAY  Abnormal metabolism Assessment & Plan: Her IC was 1100 versus calculated 1717 kcal.  In the past she had been skipping meals, doing restrictive eating and was sedentary.  This has improved and she is now losing weight.  We have reviewed measures to increase metabolism.   Pre-diabetes Assessment & Plan: Most recent A1c is  Lab Results  Component Value Date   HGBA1C 5.8 (H) 10/21/2022   HGBA1C 5.6 03/04/2017    Patient aware of disease state and risk of progression. This may contribute to abnormal cravings,  fatigue and diabetic complications without having diabetes.   She continues to make nutritional changes and has increased physical activity.  We tried metformin for pharmacoprophylaxis and she did not tolerate medication.  Continue with nutritional and  behavioral strategies.  She may also be a candidate for incretin therapy if not cost prohibitive.     Class 2 severe obesity with serious comorbidity and body mass index (BMI) of 37.0 to 37.9 in adult, unspecified obesity type [facility]) Assessment & Plan: Her IC was 1123 which is less than her predicted BMR likely due to time restricted eating and low levels of physical activity.  She was started on a category 1 plan which is isocaloric.  We will be working on distribution of calories throughout the day and increasing protein with a threshold of 30 g per meal to increase protein synthesis and thermogenesis.   Essential hypertension Assessment & Plan: Has been monitoring at home, bp improved significantly on losartan, nebivolol and amlodipine without SE.Continue current regimen.  Losing 10% of body weight may improve blood pressure control.  She is doing a great job with lifestyle changes.  Advised to monitor for symptoms of orthostasis as she continues to make progress.     PHYSICAL EXAM:  Blood pressure 122/74, pulse 64, temperature 98.1 F (36.7 C), height 5\' 7"  (1.702 m), weight 243 lb (110.2 kg), SpO2 98%. Body mass index is 38.06 kg/m.  General: She is overweight, cooperative, alert, well developed, and in no acute distress. PSYCH: Has normal mood, affect and thought process.   HEENT: EOMI, sclerae are anicteric. Lungs: Normal breathing effort, no conversational dyspnea. Extremities: No edema.  Neurologic: No gross sensory or motor deficits. No tremors or fasciculations noted.    DIAGNOSTIC DATA REVIEWED:  BMET    Component Value Date/Time   NA 140 10/21/2022 1050   K 4.6 10/21/2022 1050   CL 100 10/21/2022 1050   CO2 26 10/21/2022 1050   GLUCOSE 97 10/21/2022 1050   GLUCOSE 110 (H) 06/04/2020 1435   BUN 16 10/21/2022 1050   CREATININE 0.97 10/21/2022 1050   CREATININE 0.90 03/04/2017 1404   CALCIUM 10.2 10/21/2022 1050   GFRNONAA >60 06/04/2020 1435   GFRNONAA 69  03/04/2017 1404   GFRAA >60 10/28/2017 0418   GFRAA 79 03/04/2017 1404   Lab Results  Component Value Date   HGBA1C 5.8 (H) 10/21/2022   HGBA1C 5.6 03/04/2017   Lab Results  Component Value Date   INSULIN 12.7 10/21/2022   Lab Results  Component Value Date   TSH 1.350 10/21/2022   CBC    Component Value Date/Time   WBC 5.9 06/04/2020 1435   RBC 4.46 06/04/2020 1435   HGB 13.1 06/04/2020 1435   HCT 41.6 06/04/2020 1435   PLT 269 06/04/2020 1435   MCV 93.3 06/04/2020 1435   MCH 29.4 06/04/2020 1435   MCHC 31.5 06/04/2020 1435   RDW 13.4 06/04/2020 1435   Iron Studies No results found for: "IRON", "TIBC", "FERRITIN", "IRONPCTSAT" Lipid Panel     Component Value Date/Time   CHOL 216 (H) 10/21/2022 1050   TRIG 164 (H) 10/21/2022 1050   HDL 45 10/21/2022 1050   CHOLHDL 5.3 (H) 03/04/2017 1404   VLDL 46 (H) 03/04/2017 1404   LDLCALC 141 (H) 10/21/2022 1050   Hepatic Function Panel     Component Value Date/Time   PROT 7.9 10/21/2022 1050   ALBUMIN 4.5 10/21/2022 1050   AST 24 10/21/2022 1050   ALT 20 10/21/2022 1050  ALKPHOS 74 10/21/2022 1050   BILITOT 0.5 10/21/2022 1050      Component Value Date/Time   TSH 1.350 10/21/2022 1050   Nutritional Lab Results  Component Value Date   VD25OH 50.8 10/21/2022   VD25OH 28 (L) 03/04/2017     No follow-ups on file.Marland Kitchen She was informed of the importance of frequent follow up visits to maximize her success with intensive lifestyle modifications for her multiple health conditions.   ATTESTASTION STATEMENTS:  Reviewed by clinician on day of visit: allergies, medications, problem list, medical history, surgical history, family history, social history, and previous encounter notes.     Worthy Rancher, MD

## 2023-03-25 NOTE — Assessment & Plan Note (Signed)
Most recent A1c is  Lab Results  Component Value Date   HGBA1C 5.8 (H) 10/21/2022   HGBA1C 5.6 03/04/2017    Patient aware of disease state and risk of progression. This may contribute to abnormal cravings, fatigue and diabetic complications without having diabetes.   She continues to make nutritional changes and has increased physical activity.  We tried metformin for pharmacoprophylaxis and she did not tolerate medication.  Continue with nutritional and behavioral strategies.  She may also be a candidate for incretin therapy if not cost prohibitive.

## 2023-03-25 NOTE — Assessment & Plan Note (Signed)
Her IC was 1123 which is less than her predicted BMR likely due to time restricted eating and low levels of physical activity.  She was started on a category 1 plan which is isocaloric.  We will be working on distribution of calories throughout the day and increasing protein with a threshold of 30 g per meal to increase protein synthesis and thermogenesis.

## 2023-04-26 ENCOUNTER — Ambulatory Visit (INDEPENDENT_AMBULATORY_CARE_PROVIDER_SITE_OTHER): Payer: Medicare HMO | Admitting: Internal Medicine

## 2023-04-26 ENCOUNTER — Encounter (INDEPENDENT_AMBULATORY_CARE_PROVIDER_SITE_OTHER): Payer: Self-pay | Admitting: Internal Medicine

## 2023-04-26 VITALS — BP 126/74 | HR 66 | Temp 98.2°F | Ht 67.0 in | Wt 245.0 lb

## 2023-04-26 DIAGNOSIS — I10 Essential (primary) hypertension: Secondary | ICD-10-CM | POA: Diagnosis not present

## 2023-04-26 DIAGNOSIS — R7303 Prediabetes: Secondary | ICD-10-CM

## 2023-04-26 DIAGNOSIS — R948 Abnormal results of function studies of other organs and systems: Secondary | ICD-10-CM

## 2023-04-26 DIAGNOSIS — Z6838 Body mass index (BMI) 38.0-38.9, adult: Secondary | ICD-10-CM | POA: Diagnosis not present

## 2023-04-26 NOTE — Assessment & Plan Note (Signed)
Blood pressure has improved.  On losartan, nebivolol and amlodipine without SE.Continue current regimen.

## 2023-04-26 NOTE — Progress Notes (Signed)
Office: [phone]  /  Fax: [phone]  WEIGHT SUMMARY AND BIOMETRICS  Vitals Temp: 98.2 F (36.8 C) BP: 126/74 Pulse Rate: 66 SpO2: 99 %   Anthropometric Measurements Height: 5\' 7"  (1.702 m) Weight: 245 lb (111.1 kg) BMI (Calculated): 38.36 Weight at Last Visit: 239 lb Weight Lost Since Last Visit: 0 lb Weight Gained Since Last Visit: 2 lb Starting Weight: 240 lb Total Weight Loss (lbs): 0 lb (0 kg) Peak Weight: 276 lb   Body Composition  Body Fat %: 50.3 % Fat Mass (lbs): 123.4 lbs Muscle Mass (lbs): 115.6 lbs Total Body Water (lbs): 90.4 lbs Visceral Fat Rating : 16    No data recorded Today's Visit #: 9  Starting Date: 10/21/22   HPI  Chief Complaint: OBESITY  Yesenia Montgomery is here to discuss her progress with her obesity treatment plan. She is on the the Category 1 Plan and states she is following her eating plan approximately 95 % of the time. She states she is exercising 90+ minutes 3-4 times per week.  Interval History:  Since last office visit she has gained 2 pounds. She reports good adherence to reduced calorie nutritional plan. She has been working on not skipping meals, journaling and tracking calories, continues to exercise, eating out less, and reducing portion sizes  Orixegenic Control: Denies problems with appetite and hunger signals.  Denies problems with satiety and satiation.  Denies problems with eating patterns and portion control.  Denies abnormal cravings. Denies feeling deprived or restricted.   Barriers identified: slow metabolism for age.   Pharmacotherapy for weight loss: She is currently taking no anti-obesity medication.    ASSESSMENT AND PLAN  TREATMENT PLAN FOR OBESITY:  Recommended Dietary Goals  Hiliary is currently in the action stage of change. As such, her goal is to continue weight management plan. She has agreed to: continue current plan and with emphasis on increasing protein intake to about 30% of calories  close to 75 to 90 g of protein per day  Behavioral Intervention  We discussed the following Behavioral Modification Strategies today: increasing lean protein intake, decreasing simple carbohydrates , increasing vegetables, increasing lower glycemic fruits, increasing fiber rich foods, and increasing water intake.  Additional resources provided today: None  Recommended Physical Activity Goals  Deanna has been advised to work up to 150 minutes of moderate intensity aerobic activity a week and strengthening exercises 2-3 times per week for cardiovascular health, weight loss maintenance and preservation of muscle mass.   She has agreed to :  Continue current level of physical activity   Pharmacotherapy We discussed various medication options to help Doretta with her weight loss efforts and we both agreed to : continue with nutritional and behavioral strategies  ASSOCIATED CONDITIONS ADDRESSED TODAY  Abnormal metabolism Assessment & Plan: Patient has a slower than predicted metabolism. IC 1123 vs. calculated 1734. This may contribute to weight gain, chronic fatigue and difficulty losing weight.  We reviewed measures to improve metabolism including not skipping meals, progressive strengthening exercises, increasing protein intake at every meal and maintaining adequate hydration and sleep.     Essential hypertension Assessment & Plan: Blood pressure has improved.  On losartan, nebivolol and amlodipine without SE.Continue current regimen.     Pre-diabetes Assessment & Plan: Most recent A1c is  Lab Results  Component Value Date   HGBA1C 5.8 (H) 10/21/2022   HGBA1C 5.6 03/04/2017    Patient aware of disease state and risk of progression. This may contribute to abnormal cravings, fatigue  and diabetic complications without having diabetes.   She continues to make nutritional changes and has increased physical activity.  We tried metformin for pharmacoprophylaxis and she did not  tolerate medication.  Continue with nutritional and behavioral strategies.  We may consider incretin therapy in the future.    Class 2 severe obesity with serious comorbidity and body mass index (BMI) of 37.0 to 37.9 in adult, unspecified obesity type [facility]) Assessment & Plan: Yesenia Montgomery has come along way.  She is no longer skipping meals has been journaling calories and protein intake she has also been working out and has increased physical activity.  Despite this she continues to struggle losing weight.  Likely related to very slow metabolism.  Upon review of her journaling she is only getting about 15 to 20% of calories from protein we recommend that she increase her protein intake as this will help boost her metabolism.  She will shoot for 30 to 40 g of protein per meal.     PHYSICAL EXAM:  Blood pressure 126/74, pulse 66, temperature 98.2 F (36.8 C), height 5\' 7"  (1.702 m), weight 245 lb (111.1 kg), SpO2 99%. Body mass index is 38.37 kg/m.  General: She is overweight, cooperative, alert, well developed, and in no acute distress. PSYCH: Has normal mood, affect and thought process.   HEENT: EOMI, sclerae are anicteric. Lungs: Normal breathing effort, no conversational dyspnea. Extremities: No edema.  Neurologic: No gross sensory or motor deficits. No tremors or fasciculations noted.    DIAGNOSTIC DATA REVIEWED:  BMET    Component Value Date/Time   NA 140 10/21/2022 1050   K 4.6 10/21/2022 1050   CL 100 10/21/2022 1050   CO2 26 10/21/2022 1050   GLUCOSE 97 10/21/2022 1050   GLUCOSE 110 (H) 06/04/2020 1435   BUN 16 10/21/2022 1050   CREATININE 0.97 10/21/2022 1050   CREATININE 0.90 03/04/2017 1404   CALCIUM 10.2 10/21/2022 1050   GFRNONAA >60 06/04/2020 1435   GFRNONAA 69 03/04/2017 1404   GFRAA >60 10/28/2017 0418   GFRAA 79 03/04/2017 1404   Lab Results  Component Value Date   HGBA1C 5.8 (H) 10/21/2022   HGBA1C 5.6 03/04/2017   Lab Results  Component Value Date    INSULIN 12.7 10/21/2022   Lab Results  Component Value Date   TSH 1.350 10/21/2022   CBC    Component Value Date/Time   WBC 5.9 06/04/2020 1435   RBC 4.46 06/04/2020 1435   HGB 13.1 06/04/2020 1435   HCT 41.6 06/04/2020 1435   PLT 269 06/04/2020 1435   MCV 93.3 06/04/2020 1435   MCH 29.4 06/04/2020 1435   MCHC 31.5 06/04/2020 1435   RDW 13.4 06/04/2020 1435   Iron Studies No results found for: "IRON", "TIBC", "FERRITIN", "IRONPCTSAT" Lipid Panel     Component Value Date/Time   CHOL 216 (H) 10/21/2022 1050   TRIG 164 (H) 10/21/2022 1050   HDL 45 10/21/2022 1050   CHOLHDL 5.3 (H) 03/04/2017 1404   VLDL 46 (H) 03/04/2017 1404   LDLCALC 141 (H) 10/21/2022 1050   Hepatic Function Panel     Component Value Date/Time   PROT 7.9 10/21/2022 1050   ALBUMIN 4.5 10/21/2022 1050   AST 24 10/21/2022 1050   ALT 20 10/21/2022 1050   ALKPHOS 74 10/21/2022 1050   BILITOT 0.5 10/21/2022 1050      Component Value Date/Time   TSH 1.350 10/21/2022 1050   Nutritional Lab Results  Component Value Date   VD25OH 50.8  10/21/2022   VD25OH 28 (L) 03/04/2017     Return in about 4 weeks (around 05/24/2023) for Sahwn Rayburn in 4 weeks and in 8 weeks for me.. She was informed of the importance of frequent follow up visits to maximize her success with intensive lifestyle modifications for her multiple health conditions.   ATTESTASTION STATEMENTS:  Reviewed by clinician on day of visit: allergies, medications, problem list, medical history, surgical history, family history, social history, and previous encounter notes.     Worthy Rancher, MD

## 2023-04-26 NOTE — Assessment & Plan Note (Addendum)
Most recent A1c is  Lab Results  Component Value Date   HGBA1C 5.8 (H) 10/21/2022   HGBA1C 5.6 03/04/2017    Patient aware of disease state and risk of progression. This may contribute to abnormal cravings, fatigue and diabetic complications without having diabetes.   She continues to make nutritional changes and has increased physical activity.  We tried metformin for pharmacoprophylaxis and she did not tolerate medication.  Continue with nutritional and behavioral strategies.  We may consider incretin therapy in the future.

## 2023-04-26 NOTE — Assessment & Plan Note (Signed)
Patient has a slower than predicted metabolism. IC 1123 vs. calculated 1734. This may contribute to weight gain, chronic fatigue and difficulty losing weight.  We reviewed measures to improve metabolism including not skipping meals, progressive strengthening exercises, increasing protein intake at every meal and maintaining adequate hydration and sleep.

## 2023-04-26 NOTE — Assessment & Plan Note (Signed)
Yesenia Montgomery has come along way.  She is no longer skipping meals has been journaling calories and protein intake she has also been working out and has increased physical activity.  Despite this she continues to struggle losing weight.  Likely related to very slow metabolism.  Upon review of her journaling she is only getting about 15 to 20% of calories from protein we recommend that she increase her protein intake as this will help boost her metabolism.  She will shoot for 30 to 40 g of protein per meal.

## 2023-05-24 ENCOUNTER — Encounter (INDEPENDENT_AMBULATORY_CARE_PROVIDER_SITE_OTHER): Payer: Self-pay | Admitting: Physician Assistant

## 2023-05-24 ENCOUNTER — Ambulatory Visit (INDEPENDENT_AMBULATORY_CARE_PROVIDER_SITE_OTHER): Payer: Medicare HMO | Admitting: Physician Assistant

## 2023-05-24 VITALS — BP 150/89 | HR 69 | Temp 98.1°F | Ht 67.0 in | Wt 239.0 lb

## 2023-05-24 DIAGNOSIS — Z6837 Body mass index (BMI) 37.0-37.9, adult: Secondary | ICD-10-CM | POA: Insufficient documentation

## 2023-05-24 DIAGNOSIS — I10 Essential (primary) hypertension: Secondary | ICD-10-CM

## 2023-05-24 DIAGNOSIS — M170 Bilateral primary osteoarthritis of knee: Secondary | ICD-10-CM | POA: Insufficient documentation

## 2023-05-24 DIAGNOSIS — R0602 Shortness of breath: Secondary | ICD-10-CM | POA: Diagnosis not present

## 2023-05-24 MED ORDER — AMLODIPINE BESYLATE 5 MG PO TABS: 5.0000 mg | ORAL_TABLET | Freq: Every day | ORAL | 0 refills | Status: DC

## 2023-05-24 MED ORDER — NEBIVOLOL HCL 10 MG PO TABS: 10.0000 mg | ORAL_TABLET | Freq: Every day | ORAL | 0 refills | Status: DC

## 2023-05-24 MED ORDER — MONTELUKAST SODIUM 10 MG PO TABS
10.0000 mg | ORAL_TABLET | Freq: Every day | ORAL | 2 refills | Status: DC
Start: 2023-05-24 — End: 2023-10-19

## 2023-05-24 NOTE — Progress Notes (Signed)
.smr  Office: [phone]  /  Fax: [phone]  WEIGHT SUMMARY AND BIOMETRICS  Vitals Temp: 98.1 F (36.7 C) BP: (!) 150/89 Pulse Rate: 69 SpO2: 98 %   Anthropometric Measurements Height: 5\' 7"  (1.702 m) Weight: 239 lb (108.4 kg) BMI (Calculated): 37.42 Weight at Last Visit: 245 lb Weight Lost Since Last Visit: 6 lb Weight Gained Since Last Visit: 0 Starting Weight: 240 lb Total Weight Loss (lbs): 1 lb (0.454 kg) Peak Weight: 276 lb   Body Composition  Body Fat %: 48.8 % Fat Mass (lbs): 117 lbs Muscle Mass (lbs): 116.4 lbs Total Body Water (lbs): 82.8 lbs Visceral Fat Rating : 16   Other Clinical Data Fasting: no Labs: no Today's Visit #: 10 Starting Date: 10/21/22     HPI  Chief Complaint: OBESITY  Yesenia Montgomery is here to discuss her progress with her obesity treatment plan. She is on the the Category 1 Plan eating at least 80+ grams of protein daily and states she is following her eating plan approximately 100 % of the time. She states she is exercising body pump/HIIT class/Yoga/Barre class 60 minutes 4 times per week.   Interval History:  Since last office visit she down 6 lbs. Bio impedence scale reviewed with the patient:  Increased muscle mass by 0.8 lbs Decreased adipose mass by 6.4 lbs!! [place] work!   The patient, with a history of obesity and hypertension, presents for a follow-up visit regarding her weight loss efforts. She has been struggling with protein intake and a slow metabolism. Over the past several weeks, she has focused on increasing her protein intake and reducing her calorie intake. She has also been engaging in regular physical activity, including gym workouts and yoga. She reports feeling full quickly and eating less, but was frustrated that her weight was not dropping previously, but now realizing that she was not eating enough protein.   Seeing PCP- Dr. Nita Sells next month for AWV/lab  Pharmacotherapy: None for weight  loss  TREATMENT PLAN FOR OBESITY:  Recommended Dietary Goals  Myia is currently in the action stage of change. As such, her goal is to continue weight management plan. She has agreed to the Category 1 Plan making sure to get 30-40 grams of protein at each meal.  Behavioral Intervention  We discussed the following Behavioral Modification Strategies today: increasing lean protein intake, decreasing simple carbohydrates , increasing vegetables, increasing lower glycemic fruits, increasing fiber rich foods, avoiding skipping meals, increasing water intake, continue to practice mindfulness when eating, and planning for success.  Additional resources provided today: NA  Recommended Physical Activity Goals  Ruthia has been advised to work up to 150 minutes of moderate intensity aerobic activity a week and strengthening exercises 2-3 times per week for cardiovascular health, weight loss maintenance and preservation of muscle mass.   She has agreed to Continue current level of physical activity    Pharmacotherapy We discussed various medication options to help Canna with her weight loss efforts and we both agreed to continue to work on nutritional and behavioral strategies to promote weight loss.     Return in about 4 weeks (around 06/21/2023).Marland Kitchen She was informed of the importance of frequent follow up visits to maximize her success with intensive lifestyle modifications for her multiple health conditions.  PHYSICAL EXAM:  Blood pressure (!) 150/89, pulse 69, temperature 98.1 F (36.7 C), height 5\' 7"  (1.702 m), weight 239 lb (108.4 kg), SpO2 98%. Body mass index is 37.43 kg/m.  General: She is  overweight, cooperative, alert, well developed, and in no acute distress. PSYCH: Has normal mood, affect and thought process.   Cardiovascular: HR 60's BP 150/89-up today, but generally better at home. Lungs: Normal breathing effort, no conversational dyspnea.  DIAGNOSTIC DATA  REVIEWED:  BMET    Component Value Date/Time   NA 140 10/21/2022 1050   K 4.6 10/21/2022 1050   CL 100 10/21/2022 1050   CO2 26 10/21/2022 1050   GLUCOSE 97 10/21/2022 1050   GLUCOSE 110 (H) 06/04/2020 1435   BUN 16 10/21/2022 1050   CREATININE 0.97 10/21/2022 1050   CREATININE 0.90 03/04/2017 1404   CALCIUM 10.2 10/21/2022 1050   GFRNONAA >60 06/04/2020 1435   GFRNONAA 69 03/04/2017 1404   GFRAA >60 10/28/2017 0418   GFRAA 79 03/04/2017 1404   Lab Results  Component Value Date   HGBA1C 5.8 (H) 10/21/2022   HGBA1C 5.6 03/04/2017   Lab Results  Component Value Date   INSULIN 12.7 10/21/2022   Lab Results  Component Value Date   TSH 1.350 10/21/2022   CBC    Component Value Date/Time   WBC 5.9 06/04/2020 1435   RBC 4.46 06/04/2020 1435   HGB 13.1 06/04/2020 1435   HCT 41.6 06/04/2020 1435   PLT 269 06/04/2020 1435   MCV 93.3 06/04/2020 1435   MCH 29.4 06/04/2020 1435   MCHC 31.5 06/04/2020 1435   RDW 13.4 06/04/2020 1435   Iron Studies No results found for: "IRON", "TIBC", "FERRITIN", "IRONPCTSAT" Lipid Panel     Component Value Date/Time   CHOL 216 (H) 10/21/2022 1050   TRIG 164 (H) 10/21/2022 1050   HDL 45 10/21/2022 1050   CHOLHDL 5.3 (H) 03/04/2017 1404   VLDL 46 (H) 03/04/2017 1404   LDLCALC 141 (H) 10/21/2022 1050   Hepatic Function Panel     Component Value Date/Time   PROT 7.9 10/21/2022 1050   ALBUMIN 4.5 10/21/2022 1050   AST 24 10/21/2022 1050   ALT 20 10/21/2022 1050   ALKPHOS 74 10/21/2022 1050   BILITOT 0.5 10/21/2022 1050      Component Value Date/Time   TSH 1.350 10/21/2022 1050   Nutritional Lab Results  Component Value Date   VD25OH 50.8 10/21/2022   VD25OH 28 (L) 03/04/2017    ASSOCIATED CONDITIONS ADDRESSED TODAY  ASSESSMENT AND PLAN  Problem List Items Addressed This Visit     Essential hypertension - Primary   Relevant Medications   amLODipine (NORVASC) 5 MG tablet   nebivolol (BYSTOLIC) 10 MG tablet   Class  2 severe obesity with serious comorbidity and body mass index (BMI) of 37.0 to 37.9 in adult [facility])   Primary osteoarthritis of both knees   Other Visit Diagnoses     SOB (shortness of breath) on exertion       Relevant Medications   montelukast (SINGULAIR) 10 MG tablet      Obesity Patient has been adhering to a high protein diet and regular exercise regimen, resulting in a weight loss of 6+ pounds adipose mass and an increase in muscle mass.  Despite a slow metabolism, the patient is making progress in her weight loss journey. -Continue current diet and exercise regimen, focusing on protein intake, hydration, and regular movement. -Return for follow-up in one month with Dr. Rikki Spearing.  Hypertension Blood pressure was slightly elevated during the visit. Patient is currently on Amlodipine and Bystolic and losartan. BP previously at home in 130-140-80-90 range. Now more consistently in 120-130/70-80 range. Patient continues monitoring at  home on regular basis.  -Refill /Continue Amlodipine and Bystolic prescriptions as patient notes that she is almost out of medications . Continue losartan.  -Continue monitoring blood pressure at home. -Address blood pressure control with primary care physician at next [facility] appointment.  SOB on exertion: Patient working out regularly to improve exercise capacity.  Reports doing well on Singular but reports prescription nearly out and would like to refill until she sees PCP again next month for AWV.  Continue and refill Singular x 1 month.   Arthritis Patient reports arthritis in both knees, which occasionally interferes with her exercise regimen. -Continue current exercise regimen as tolerated. -Consider low-impact exercises during arthritis flare-ups.Monitor.    ATTESTASTION STATEMENTS:  Reviewed by clinician on day of visit: allergies, medications, problem list, medical history, surgical history, family history, social history, and previous  encounter notes.   I have personally spent 36 minutes total time today in preparation, patient care, nutritional counseling and documentation for this visit, including the following: review of clinical lab tests; review of medical tests/procedures/services.      Laine Giovanetti, PA-C

## 2023-06-17 DIAGNOSIS — E782 Mixed hyperlipidemia: Secondary | ICD-10-CM | POA: Diagnosis not present

## 2023-06-17 DIAGNOSIS — I10 Essential (primary) hypertension: Secondary | ICD-10-CM | POA: Diagnosis not present

## 2023-06-17 DIAGNOSIS — R7301 Impaired fasting glucose: Secondary | ICD-10-CM | POA: Diagnosis not present

## 2023-06-21 ENCOUNTER — Encounter (INDEPENDENT_AMBULATORY_CARE_PROVIDER_SITE_OTHER): Payer: Self-pay | Admitting: Internal Medicine

## 2023-06-21 ENCOUNTER — Ambulatory Visit (INDEPENDENT_AMBULATORY_CARE_PROVIDER_SITE_OTHER): Payer: Medicare HMO | Admitting: Internal Medicine

## 2023-06-21 VITALS — BP 161/96 | HR 58 | Temp 98.0°F | Ht 67.0 in | Wt 241.0 lb

## 2023-06-21 DIAGNOSIS — R7303 Prediabetes: Secondary | ICD-10-CM

## 2023-06-21 DIAGNOSIS — Z6837 Body mass index (BMI) 37.0-37.9, adult: Secondary | ICD-10-CM

## 2023-06-21 DIAGNOSIS — E66812 Obesity, class 2: Secondary | ICD-10-CM

## 2023-06-21 DIAGNOSIS — R948 Abnormal results of function studies of other organs and systems: Secondary | ICD-10-CM | POA: Diagnosis not present

## 2023-06-21 DIAGNOSIS — R29818 Other symptoms and signs involving the nervous system: Secondary | ICD-10-CM | POA: Diagnosis not present

## 2023-06-21 DIAGNOSIS — E669 Obesity, unspecified: Secondary | ICD-10-CM

## 2023-06-21 DIAGNOSIS — I10 Essential (primary) hypertension: Secondary | ICD-10-CM | POA: Diagnosis not present

## 2023-06-21 MED ORDER — TIRZEPATIDE 2.5 MG/0.5ML ~~LOC~~ SOAJ
2.5000 mg | SUBCUTANEOUS | 0 refills | Status: DC
Start: 2023-06-21 — End: 2023-07-19

## 2023-06-21 NOTE — Assessment & Plan Note (Signed)
Patient has labile hypertension, has a BMI of 37 with inadequate sleep, reported snoring, some daytime fatigue and somnolence.  Her neck circumference is 16.5 inches, and has a Mallampati of 3.  She will be referred for sleep study at [facility].

## 2023-06-21 NOTE — Assessment & Plan Note (Signed)
Patient has a slower than predicted metabolism. IC 1123 vs. calculated 1734. This may contribute to weight gain, chronic fatigue and difficulty losing weight.   We reviewed measures to improve metabolism including not skipping meals, progressive strengthening exercises, increasing protein intake at every meal and maintaining adequate hydration and sleep.  Patient will be referred for sleep study.

## 2023-06-21 NOTE — Assessment & Plan Note (Signed)
Most recent A1c is  Lab Results  Component Value Date   HGBA1C 5.8 (H) 10/21/2022   HGBA1C 5.6 03/04/2017    She continues to make nutritional changes and has increased physical activity.  We tried metformin for pharmacoprophylaxis and she did not tolerate medication.  Continue with nutritional and behavioral strategies.  We will prescribe Mounjaro for pharmacoprophylaxis and assistance with weight management.

## 2023-06-21 NOTE — Progress Notes (Signed)
Office: [phone]  /  Fax: [phone]  WEIGHT SUMMARY AND BIOMETRICS  Vitals Temp: 98 F (36.7 C) BP: (!) 161/96 Pulse Rate: (!) 58 SpO2: 98 %   Anthropometric Measurements Height: 5\' 7"  (1.702 m) Weight: 241 lb (109.3 kg) BMI (Calculated): 37.74 Weight at Last Visit: 239 lb Weight Lost Since Last Visit: 0 lb Weight Gained Since Last Visit: 2 lb Starting Weight: 240 lb Total Weight Loss (lbs): 0 lb (0 kg) Peak Weight: 276 lb   Body Composition  Body Fat %: 50 % Fat Mass (lbs): 120.6 lbs Muscle Mass (lbs): 114.4 lbs Total Body Water (lbs): 84 lbs Visceral Fat Rating : 16    No data recorded Today's Visit #: 11  Starting Date: 10/21/22   HPI  Chief Complaint: OBESITY  Yesenia Montgomery is here to discuss her progress with her obesity treatment plan. She is on the the Category 1 Plan and states she is following her eating plan approximately 100 % of the time. She states she is exercising 45-60 minutes 3 times per week.  Interval History:  Since last office visit she has maintained weight. She reports good adherence to reduced calorie nutritional plan. She has been working on reading food labels, not skipping meals, increasing protein intake at every meal, drinking more water, making healthier choices, reducing portion sizes, and incorporating more whole foods  Orexigenic Control: Denies problems with appetite and hunger signals.  Denies problems with satiety and satiation.  Denies problems with eating patterns and portion control.  Denies abnormal cravings. Denies feeling deprived or restricted.   Barriers identified: inadequate sleep and slow metabolism for age.   Pharmacotherapy for weight loss: She is currently taking no anti-obesity medication.    ASSESSMENT AND PLAN  TREATMENT PLAN FOR OBESITY:  Recommended Dietary Goals  Yesenia Montgomery is currently in the action stage of change. As such, her goal is to continue weight management plan. She has agreed to:  continue current plan  Behavioral Intervention  We discussed the following Behavioral Modification Strategies today: continue to work on maintaining a reduced calorie state, getting the recommended amount of protein, incorporating whole foods, making healthy choices, staying well hydrated and practicing mindfulness when eating..  Additional resources provided today: None and Handout on symptoms of sleep apnea, health risk and its effect on weight management  Recommended Physical Activity Goals  Yesenia Montgomery has been advised to work up to 150 minutes of moderate intensity aerobic activity a week and strengthening exercises 2-3 times per week for cardiovascular health, weight loss maintenance and preservation of muscle mass.   She has agreed to :  Think about enjoyable ways to increase daily physical activity and overcoming barriers to exercise and Increase physical activity in their day and reduce sedentary time (increase NEAT).  Pharmacotherapy We discussed various medication options to help Yesenia Montgomery with her weight loss efforts and we both agreed to :  Patient will be started on Mounjaro 2.5 mg once a week for diabetes prevention as she is intolerant to metformin.  This may also benefit her from an obesity standpoint.  ASSOCIATED CONDITIONS ADDRESSED TODAY  Abnormal metabolism Assessment & Plan: Patient has a slower than predicted metabolism. IC 1123 vs. calculated 1734. This may contribute to weight gain, chronic fatigue and difficulty losing weight.   We reviewed measures to improve metabolism including not skipping meals, progressive strengthening exercises, increasing protein intake at every meal and maintaining adequate hydration and sleep.  Patient will be referred for sleep study.   Orders: -  Ambulatory referral to Sleep Studies  Essential hypertension Assessment & Plan: Her blood pressure is uncontrolled today I also noticed a previous reading was elevated she denies having  problems with adherence but is unable to recall her blood pressure medications.  Also based on pharmacy records number of days into session is low for some of the drugs so I suspect low adherence.  I also had requested that her refills be managed by her PCP to avoid redundancy and possible errors.  We reviewed medications with her today she will look at her bottles.  She has an appointment scheduled with her PCP on Thursday.  Before making medication adjustments we need to make sure that she is taking amlodipine, Bystolic and losartan I suspect she has not been keeping up with some of her medications.  Patient counseled on the risk associated with uncontrolled blood pressure  Orders: -     Ambulatory referral to Sleep Studies -     Tirzepatide; Inject 2.5 mg into the skin once a week.  Dispense: 2 mL; Refill: 0  Obesity - (Start BMI 37.57 Assessment & Plan: Peak Weight: 276 Starting Weight : 240 Current Weight : 241  Working BMR: 1123 Weight loss goal: 10 % in 6 months Nutritional: Cat 1 = 1000 kcal 80-90 grams of protein daily. Patient also tracking and journaling Past Pharmacotherapy: Metformin Current Pharmacotherapy: None Contributing factors: Slow metabolism for age   Very difficult situation, she has an abnormally slow metabolism without a clear cause.  She has increased physical activity she also tracks in journals calories and has been staying under 1200 but not less than the thousand.  She has some symptoms suspicious for sleep apnea and I therefore recommend that she have a sleep study as this could affect her weight and blood pressure.  I recommend that she start Mounjaro for diabetes prevention if covered by her insurance as she did not tolerate metformin prophylaxis in the past.  She is also affected by obesity and benefits from incretin therapy.  Orders: -     Ambulatory referral to Sleep Studies -     Tirzepatide; Inject 2.5 mg into the skin once a week.  Dispense: 2 mL;  Refill: 0  Pre-diabetes Assessment & Plan: Most recent A1c is  Lab Results  Component Value Date   HGBA1C 5.8 (H) 10/21/2022   HGBA1C 5.6 03/04/2017    She continues to make nutritional changes and has increased physical activity.  We tried metformin for pharmacoprophylaxis and she did not tolerate medication.  Continue with nutritional and behavioral strategies.  We will prescribe Mounjaro for pharmacoprophylaxis and assistance with weight management.   Orders: -     Tirzepatide; Inject 2.5 mg into the skin once a week.  Dispense: 2 mL; Refill: 0  Suspected sleep apnea Assessment & Plan: Patient has labile hypertension, has a BMI of 37 with inadequate sleep, reported snoring, some daytime fatigue and somnolence.  Her neck circumference is 16.5 inches, and has a Mallampati of 3.  She will be referred for sleep study at [facility].     PHYSICAL EXAM:  Blood pressure (!) 161/96, pulse (!) 58, temperature 98 F (36.7 C), height 5\' 7"  (1.702 m), weight 241 lb (109.3 kg), SpO2 98%. Body mass index is 37.75 kg/m.  General: She is overweight, cooperative, alert, well developed, and in no acute distress. PSYCH: Has normal mood, affect and thought process.   HEENT: EOMI, sclerae are anicteric. Lungs: Normal breathing effort, no conversational dyspnea.  Extremities: No edema.  Neurologic: No gross sensory or motor deficits. No tremors or fasciculations noted.    DIAGNOSTIC DATA REVIEWED:  BMET    Component Value Date/Time   NA 140 10/21/2022 1050   K 4.6 10/21/2022 1050   CL 100 10/21/2022 1050   CO2 26 10/21/2022 1050   GLUCOSE 97 10/21/2022 1050   GLUCOSE 110 (H) 06/04/2020 1435   BUN 16 10/21/2022 1050   CREATININE 0.97 10/21/2022 1050   CREATININE 0.90 03/04/2017 1404   CALCIUM 10.2 10/21/2022 1050   GFRNONAA >60 06/04/2020 1435   GFRNONAA 69 03/04/2017 1404   GFRAA >60 10/28/2017 0418   GFRAA 79 03/04/2017 1404   Lab Results  Component  Value Date   HGBA1C 5.8 (H) 10/21/2022   HGBA1C 5.6 03/04/2017   Lab Results  Component Value Date   INSULIN 12.7 10/21/2022   Lab Results  Component Value Date   TSH 1.350 10/21/2022   CBC    Component Value Date/Time   WBC 5.9 06/04/2020 1435   RBC 4.46 06/04/2020 1435   HGB 13.1 06/04/2020 1435   HCT 41.6 06/04/2020 1435   PLT 269 06/04/2020 1435   MCV 93.3 06/04/2020 1435   MCH 29.4 06/04/2020 1435   MCHC 31.5 06/04/2020 1435   RDW 13.4 06/04/2020 1435   Iron Studies No results found for: "IRON", "TIBC", "FERRITIN", "IRONPCTSAT" Lipid Panel     Component Value Date/Time   CHOL 216 (H) 10/21/2022 1050   TRIG 164 (H) 10/21/2022 1050   HDL 45 10/21/2022 1050   CHOLHDL 5.3 (H) 03/04/2017 1404   VLDL 46 (H) 03/04/2017 1404   LDLCALC 141 (H) 10/21/2022 1050   Hepatic Function Panel     Component Value Date/Time   PROT 7.9 10/21/2022 1050   ALBUMIN 4.5 10/21/2022 1050   AST 24 10/21/2022 1050   ALT 20 10/21/2022 1050   ALKPHOS 74 10/21/2022 1050   BILITOT 0.5 10/21/2022 1050      Component Value Date/Time   TSH 1.350 10/21/2022 1050   Nutritional Lab Results  Component Value Date   VD25OH 50.8 10/21/2022   VD25OH 28 (L) 03/04/2017     Return in about 3 weeks (around 07/12/2023) for For Weight Mangement with Dr. Rikki Spearing.Marland Kitchen She was informed of the importance of frequent follow up visits to maximize her success with intensive lifestyle modifications for her multiple health conditions.   ATTESTASTION STATEMENTS:  Reviewed by clinician on day of visit: allergies, medications, problem list, medical history, surgical history, family history, social history, and previous encounter notes.     Worthy Rancher, MD

## 2023-06-21 NOTE — Assessment & Plan Note (Signed)
Her blood pressure is uncontrolled today I also noticed a previous reading was elevated she denies having problems with adherence but is unable to recall her blood pressure medications.  Also based on pharmacy records number of days into session is low for some of the drugs so I suspect low adherence.  I also had requested that her refills be managed by her PCP to avoid redundancy and possible errors.  We reviewed medications with her today she will look at her bottles.  She has an appointment scheduled with her PCP on Thursday.  Before making medication adjustments we need to make sure that she is taking amlodipine, Bystolic and losartan I suspect she has not been keeping up with some of her medications.  Patient counseled on the risk associated with uncontrolled blood pressure

## 2023-06-21 NOTE — Assessment & Plan Note (Addendum)
Peak Weight: 276 Starting Weight : 240 Current Weight : 241  Working BMR: 1123 Weight loss goal: 10 % in 6 months Nutritional: Cat 1 = 1000 kcal 80-90 grams of protein daily. Patient also tracking and journaling Past Pharmacotherapy: Metformin Current Pharmacotherapy: None Contributing factors: Slow metabolism for age   Very difficult situation, she has an abnormally slow metabolism without a clear cause.  She has increased physical activity she also tracks in journals calories and has been staying under 1200 but not less than the thousand.  She has some symptoms suspicious for sleep apnea and I therefore recommend that she have a sleep study as this could affect her weight and blood pressure.  I recommend that she start Mounjaro for diabetes prevention if covered by her insurance as she did not tolerate metformin prophylaxis in the past.  She is also affected by obesity and benefits from incretin therapy.

## 2023-06-24 ENCOUNTER — Other Ambulatory Visit (HOSPITAL_COMMUNITY): Payer: Self-pay | Admitting: Internal Medicine

## 2023-06-24 DIAGNOSIS — E669 Obesity, unspecified: Secondary | ICD-10-CM | POA: Diagnosis not present

## 2023-06-24 DIAGNOSIS — K7689 Other specified diseases of liver: Secondary | ICD-10-CM | POA: Diagnosis not present

## 2023-06-24 DIAGNOSIS — E782 Mixed hyperlipidemia: Secondary | ICD-10-CM | POA: Diagnosis not present

## 2023-06-24 DIAGNOSIS — M25562 Pain in left knee: Secondary | ICD-10-CM | POA: Diagnosis not present

## 2023-06-24 DIAGNOSIS — M25511 Pain in right shoulder: Secondary | ICD-10-CM | POA: Diagnosis not present

## 2023-06-24 DIAGNOSIS — R944 Abnormal results of kidney function studies: Secondary | ICD-10-CM | POA: Diagnosis not present

## 2023-06-24 DIAGNOSIS — I10 Essential (primary) hypertension: Secondary | ICD-10-CM | POA: Diagnosis not present

## 2023-06-24 DIAGNOSIS — R600 Localized edema: Secondary | ICD-10-CM | POA: Diagnosis not present

## 2023-06-24 DIAGNOSIS — R7301 Impaired fasting glucose: Secondary | ICD-10-CM | POA: Diagnosis not present

## 2023-06-24 DIAGNOSIS — Z6837 Body mass index (BMI) 37.0-37.9, adult: Secondary | ICD-10-CM | POA: Diagnosis not present

## 2023-07-01 ENCOUNTER — Other Ambulatory Visit (HOSPITAL_COMMUNITY): Payer: Self-pay | Admitting: Internal Medicine

## 2023-07-01 DIAGNOSIS — Z1231 Encounter for screening mammogram for malignant neoplasm of breast: Secondary | ICD-10-CM

## 2023-07-02 ENCOUNTER — Ambulatory Visit (HOSPITAL_COMMUNITY)
Admission: RE | Admit: 2023-07-02 | Discharge: 2023-07-02 | Disposition: A | Discharge status: Home / Self Care | Payer: Medicare HMO | Source: Ambulatory Visit | Attending: Internal Medicine | Admitting: Internal Medicine

## 2023-07-02 DIAGNOSIS — R7989 Other specified abnormal findings of blood chemistry: Secondary | ICD-10-CM | POA: Diagnosis not present

## 2023-07-02 DIAGNOSIS — K7689 Other specified diseases of liver: Secondary | ICD-10-CM | POA: Insufficient documentation

## 2023-07-05 ENCOUNTER — Ambulatory Visit (HOSPITAL_COMMUNITY)
Admission: RE | Admit: 2023-07-05 | Discharge: 2023-07-05 | Disposition: A | Discharge status: Home / Self Care | Payer: Medicare HMO | Source: Ambulatory Visit | Attending: Internal Medicine | Admitting: Internal Medicine

## 2023-07-05 DIAGNOSIS — Z1231 Encounter for screening mammogram for malignant neoplasm of breast: Secondary | ICD-10-CM | POA: Diagnosis not present

## 2023-07-09 DIAGNOSIS — H5203 Hypermetropia, bilateral: Secondary | ICD-10-CM | POA: Diagnosis not present

## 2023-07-09 DIAGNOSIS — H52223 Regular astigmatism, bilateral: Secondary | ICD-10-CM | POA: Diagnosis not present

## 2023-07-19 ENCOUNTER — Encounter (INDEPENDENT_AMBULATORY_CARE_PROVIDER_SITE_OTHER): Payer: Self-pay | Admitting: Internal Medicine

## 2023-07-19 ENCOUNTER — Ambulatory Visit (INDEPENDENT_AMBULATORY_CARE_PROVIDER_SITE_OTHER): Payer: Medicare HMO | Admitting: Internal Medicine

## 2023-07-19 VITALS — BP 108/69 | HR 64 | Temp 97.9°F | Ht 67.0 in | Wt 237.0 lb

## 2023-07-19 DIAGNOSIS — K760 Fatty (change of) liver, not elsewhere classified: Secondary | ICD-10-CM | POA: Insufficient documentation

## 2023-07-19 DIAGNOSIS — Z6837 Body mass index (BMI) 37.0-37.9, adult: Secondary | ICD-10-CM

## 2023-07-19 DIAGNOSIS — I10 Essential (primary) hypertension: Secondary | ICD-10-CM | POA: Diagnosis not present

## 2023-07-19 DIAGNOSIS — R7303 Prediabetes: Secondary | ICD-10-CM | POA: Diagnosis not present

## 2023-07-19 DIAGNOSIS — E669 Obesity, unspecified: Secondary | ICD-10-CM | POA: Diagnosis not present

## 2023-07-19 MED ORDER — TIRZEPATIDE 2.5 MG/0.5ML ~~LOC~~ SOAJ: 2.5000 mg | SUBCUTANEOUS | 0 refills | Status: DC

## 2023-07-19 MED ORDER — AMLODIPINE BESYLATE 5 MG PO TABS: 5.0000 mg | ORAL_TABLET | Freq: Every day | ORAL | 0 refills | Status: DC

## 2023-07-19 NOTE — Assessment & Plan Note (Signed)
Her blood pressure has improved substantially.  She has been keeping a log and since November they are close to goal.  She is currently on amlodipine, losartan and Bystolic without any orthostatic dizziness or adverse effects.  She will continue current regimen monitor for orthostasis while losing weight

## 2023-07-19 NOTE — Assessment & Plan Note (Signed)
I reviewed ultrasound from October that showed hepatic steatosis.  She has multiple risk factors for MASLD.  Unfortunately I could not download her labs she will bring those in with her at the next office visit.  Plan: Follow-up test results. Losing 15 % of body may lower risk. It is also recommended that she avoid processed foods, simple sugars, heavy alcohol consumption and steatogenic medications. She also benefits from ongoing GLP-1 therapy

## 2023-07-19 NOTE — Assessment & Plan Note (Signed)
Most recent A1c is  Lab Results  Component Value Date   HGBA1C 5.8 (H) 10/21/2022   HGBA1C 5.6 03/04/2017    She continues to make nutritional changes and has increased physical activity.  We tried metformin for pharmacoprophylaxis and she did not tolerate medication.  She is now on Mounjaro 2.5 mg once a week she will continue at current dose

## 2023-07-19 NOTE — Progress Notes (Signed)
Office: [phone]  /  Fax: [phone]  Weight Summary And Biometrics  Vitals Temp: 97.9 F (36.6 C) BP: 108/69 Pulse Rate: 64 SpO2: 99 %   Anthropometric Measurements Height: 5\' 7"  (1.702 m) Weight: 237 lb (107.5 kg) BMI (Calculated): 37.11 Weight at Last Visit: 241 lb Weight Lost Since Last Visit: 4 lb Weight Gained Since Last Visit: 0 lb Starting Weight: 240 lb Total Weight Loss (lbs): 3 lb (1.361 kg) Peak Weight: 276 lb   Body Composition  Body Fat %: 49.3 % Fat Mass (lbs): 117.2 lbs Muscle Mass (lbs): 114.4 lbs Total Body Water (lbs): 84.6 lbs Visceral Fat Rating : 16    No data recorded Today's Visit #: 12  Starting Date: 10/21/22   Subjective   Chief Complaint: Obesity  Yesenia Montgomery is here to discuss her progress with her obesity treatment plan. She is on the the Category 1 Plan and states she is following her eating plan approximately 100 % of the time. She states she is exercising 80-90 minutes 2 times per week.  Interval History:   Discussed the use of AI scribe software for clinical note transcription with the patient, who gave verbal consent to proceed.   History of Present Illness   The patient, affected by obesity, prediabetes, and hypertension, presents for weight management. She reports a significant decrease in appetite since starting Mounjaro 2.5 mg which has led to a weight loss of four pounds. The patient notes that she feels fuller quicker, leading to reduced portion sizes and decreased snacking. She has adopted healthier snacking habits, including consuming celery, carrots, cucumbers, and low-carb vegetables such as asparagus and broccoli.  The patient also reports engaging in physical activity for 80-90 minutes twice a week. However, she has recently experienced muscle cramps and soreness, particularly in the legs, which she attributes to the intensity of her workouts.  The patient has been monitoring her blood pressure, which has  remained stable for several months. She has not experienced any constipation, nausea, or vomiting since starting the new medication. Her diet includes smoothies made from spinach, kale, carrots, protein powder, chia seeds, and mixed berries.  The patient has also been diagnosed with fatty liver disease on recent abdominal ultrasound this was ordered by her primary care physician.  Despite the challenges, the patient remains committed to her weight loss journey and has noticed a positive influence on her spouse's eating habits. She has managed to maintain her weight loss, not reverting back to her previous weight.      Orexigenic Control:  Denies problems with appetite and hunger signals.  Denies problems with satiety and satiation.  Denies problems with eating patterns and portion control.  Denies abnormal cravings. Denies feeling deprived or restricted.   Barriers identified: none.   Pharmacotherapy for weight loss: She is currently taking Monjauro with diabetes as the primary indication with adequate clinical response  and without side effects..   Assessment and Plan   Treatment Plan For Obesity:  Recommended Dietary Goals  Yesenia Montgomery is currently in the action stage of change. As such, her goal is to continue weight management plan. She has agreed to: continue current plan  Behavioral Intervention  We discussed the following Behavioral Modification Strategies today: continue to work on maintaining a reduced calorie state, getting the recommended amount of protein, incorporating whole foods, making healthy choices, staying well hydrated and practicing mindfulness when eating..  Additional resources provided today: None  Recommended Physical Activity Goals  Yesenia Montgomery has been advised to work  up to 150 minutes of moderate intensity aerobic activity a week and strengthening exercises 2-3 times per week for cardiovascular health, weight loss maintenance and preservation of muscle mass.    She has agreed to :  Think about enjoyable ways to increase daily physical activity and overcoming barriers to exercise and Increase physical activity in their day and reduce sedentary time (increase NEAT).  Pharmacotherapy  We discussed various medication options to help Yesenia Montgomery with her weight loss efforts and we both agreed to : continue with nutritional and behavioral strategies  Associated Conditions Addressed Today  Metabolic dysfunction-associated steatotic liver disease (MASLD) Assessment & Plan: I reviewed ultrasound from October that showed hepatic steatosis.  She has multiple risk factors for MASLD.  Unfortunately I could not download her labs she will bring those in with her at the next office visit.  Plan: Follow-up test results. Losing 15 % of body may lower risk. It is also recommended that she avoid processed foods, simple sugars, heavy alcohol consumption and steatogenic medications. She also benefits from ongoing GLP-1 therapy    Essential hypertension Assessment & Plan: Her blood pressure has improved substantially.  She has been keeping a log and since November they are close to goal.  She is currently on amlodipine, losartan and Bystolic without any orthostatic dizziness or adverse effects.  She will continue current regimen monitor for orthostasis while losing weight  Orders: -     Tirzepatide; Inject 2.5 mg into the skin once a week.  Dispense: 2 mL; Refill: 0 -     amLODIPine Besylate; Take 1 tablet (5 mg total) by mouth daily.  Dispense: 90 tablet; Refill: 0  Obesity - (Start BMI 37.57 Assessment & Plan: Peak Weight: 276 Starting Weight : 240 Current Weight : 241  Working BMR: 1123 Weight loss goal: 10 % in 6 months Nutritional: Cat 1 = 1000 kcal 80-90 grams of protein daily. Patient also tracking and journaling Past Pharmacotherapy: Metformin Current Pharmacotherapy: None Contributing factors: Slow metabolism for age   Since starting Mounjaro patient  has now started to lose weight it seems like she has better orexigenic control.  She is not having any side effects she will continue medication for diabetes prevention.  See obesity treatment plan  Orders: -     Tirzepatide; Inject 2.5 mg into the skin once a week.  Dispense: 2 mL; Refill: 0  Pre-diabetes Assessment & Plan: Most recent A1c is  Lab Results  Component Value Date   HGBA1C 5.8 (H) 10/21/2022   HGBA1C 5.6 03/04/2017    She continues to make nutritional changes and has increased physical activity.  We tried metformin for pharmacoprophylaxis and she did not tolerate medication.  She is now on Mounjaro 2.5 mg once a week she will continue at current dose   Orders: -     Tirzepatide; Inject 2.5 mg into the skin once a week.  Dispense: 2 mL; Refill: 0    General Health Maintenance Yesenia Montgomery is making significant lifestyle changes, including healthy eating and regular exercise, positively influencing her spouse. We will encourage continued healthy lifestyle changes and support her role as a positive influence on her spouse.  Follow-up We will follow up in four weeks, review lab results at the next visit, and attend the sleep study appointment.             Objective   Physical Exam:  Blood pressure 108/69, pulse 64, temperature 97.9 F (36.6 C), height 5\' 7"  (1.702 m), weight 237 lb (  107.5 kg), SpO2 99%. Body mass index is 37.12 kg/m.  General: She is overweight, cooperative, alert, well developed, and in no acute distress. PSYCH: Has normal mood, affect and thought process.   HEENT: EOMI, sclerae are anicteric. Lungs: Normal breathing effort, no conversational dyspnea. Extremities: No edema.  Neurologic: No gross sensory or motor deficits. No tremors or fasciculations noted.    Diagnostic Data Reviewed:  BMET    Component Value Date/Time   NA 140 10/21/2022 1050   K 4.6 10/21/2022 1050   CL 100 10/21/2022 1050   CO2 26 10/21/2022 1050   GLUCOSE 97  10/21/2022 1050   GLUCOSE 110 (H) 06/04/2020 1435   BUN 16 10/21/2022 1050   CREATININE 0.97 10/21/2022 1050   CREATININE 0.90 03/04/2017 1404   CALCIUM 10.2 10/21/2022 1050   GFRNONAA >60 06/04/2020 1435   GFRNONAA 69 03/04/2017 1404   GFRAA >60 10/28/2017 0418   GFRAA 79 03/04/2017 1404   Lab Results  Component Value Date   HGBA1C 5.8 (H) 10/21/2022   HGBA1C 5.6 03/04/2017   Lab Results  Component Value Date   INSULIN 12.7 10/21/2022   Lab Results  Component Value Date   TSH 1.350 10/21/2022   CBC    Component Value Date/Time   WBC 5.9 06/04/2020 1435   RBC 4.46 06/04/2020 1435   HGB 13.1 06/04/2020 1435   HCT 41.6 06/04/2020 1435   PLT 269 06/04/2020 1435   MCV 93.3 06/04/2020 1435   MCH 29.4 06/04/2020 1435   MCHC 31.5 06/04/2020 1435   RDW 13.4 06/04/2020 1435   Iron Studies No results found for: "IRON", "TIBC", "FERRITIN", "IRONPCTSAT" Lipid Panel     Component Value Date/Time   CHOL 216 (H) 10/21/2022 1050   TRIG 164 (H) 10/21/2022 1050   HDL 45 10/21/2022 1050   CHOLHDL 5.3 (H) 03/04/2017 1404   VLDL 46 (H) 03/04/2017 1404   LDLCALC 141 (H) 10/21/2022 1050   Hepatic Function Panel     Component Value Date/Time   PROT 7.9 10/21/2022 1050   ALBUMIN 4.5 10/21/2022 1050   AST 24 10/21/2022 1050   ALT 20 10/21/2022 1050   ALKPHOS 74 10/21/2022 1050   BILITOT 0.5 10/21/2022 1050      Component Value Date/Time   TSH 1.350 10/21/2022 1050   Nutritional Lab Results  Component Value Date   VD25OH 50.8 10/21/2022   VD25OH 28 (L) 03/04/2017    Follow-Up   Return in about 4 weeks (around 08/16/2023) for For Weight Mangement with Dr. Rikki Spearing.Marland Kitchen She was informed of the importance of frequent follow up visits to maximize her success with intensive lifestyle modifications for her multiple health conditions.  Attestation Statement   Reviewed by clinician on day of visit: allergies, medications, problem list, medical history, surgical history, family  history, social history, and previous encounter notes.     Worthy Rancher, MD

## 2023-07-19 NOTE — Assessment & Plan Note (Signed)
Peak Weight: 276 Starting Weight : 240 Current Weight : 241  Working BMR: 1123 Weight loss goal: 10 % in 6 months Nutritional: Cat 1 = 1000 kcal 80-90 grams of protein daily. Patient also tracking and journaling Past Pharmacotherapy: Metformin Current Pharmacotherapy: None Contributing factors: Slow metabolism for age   Since starting Mounjaro patient has now started to lose weight it seems like she has better orexigenic control.  She is not having any side effects she will continue medication for diabetes prevention.  See obesity treatment plan

## 2023-07-22 ENCOUNTER — Encounter: Payer: Self-pay | Admitting: Neurology

## 2023-07-22 ENCOUNTER — Ambulatory Visit: Payer: Medicare HMO | Admitting: Neurology

## 2023-07-22 VITALS — BP 137/84 | HR 74 | Ht 68.0 in | Wt 239.0 lb

## 2023-07-22 DIAGNOSIS — Z9189 Other specified personal risk factors, not elsewhere classified: Secondary | ICD-10-CM | POA: Diagnosis not present

## 2023-07-22 DIAGNOSIS — R351 Nocturia: Secondary | ICD-10-CM

## 2023-07-22 DIAGNOSIS — R0683 Snoring: Secondary | ICD-10-CM | POA: Diagnosis not present

## 2023-07-22 DIAGNOSIS — E669 Obesity, unspecified: Secondary | ICD-10-CM

## 2023-07-22 NOTE — Progress Notes (Signed)
Subjective:    Patient ID: Yesenia Montgomery is a 67 y.o. female.  HPI    Huston Foley, MD, PhD [facility] [address] [address] [place], [place] [address]  Dear Dr. Rikki Spearing,  I saw your patient, Yesenia Montgomery, upon your kind request in my sleep clinic today for initial consultation of her sleep disorder, in particular, concern for underlying obstructive sleep apnea.  The patient is unaccompanied today.  As you know, Yesenia Montgomery is a 67 year old female with an underlying medical history of hypertension, prediabetes, arthritis, hyperlipidemia, vitamin D deficiency, and obesity, who reports snoring and difficulty losing weight.  She has been working on weight loss for months.  Her Epworth sleepiness score is 0 out of 24 fatigue severity score is 15 out of 63.  She lives alone.  Bedtime is generally between 8 and 10 and rise time between 8 and 9.  She has a TV in her bedroom but turns it off before falling asleep.  No pets in the household.  She is widowed.  She has arthritis affecting both knees, left more than right.  She denies recurrent morning or nocturnal headaches.  She does have nocturia about 2-3 times per average night.  She does not drink caffeine daily.  She drinks alcohol occasionally.  She quit smoking over 30 years ago was a light smoker.  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  She had a tonsillectomy as a child.  I reviewed your office note from 06/21/2023.  She typically takes a late morning or early afternoon nap, on a nearly daily basis, up to 1 hour.  Her Past Medical History Is Significant For: Past Medical History:  Diagnosis Date   Arthritis    knees   Chronic pain    Depression screen 10/21/2022   Edema of both lower extremities    HLD (hyperlipidemia) 03/30/2017   Hypertension    Joint pain    Osteoarthritis    Prediabetes    Vitamin D deficiency     Her Past Surgical History Is Significant For: Past  Surgical History:  Procedure Laterality Date   ABDOMINAL HYSTERECTOMY N/A 10/27/2017   Procedure: TOTAL ABDOMINAL HYSTERECTOMY;  Surgeon: Lazaro Arms, MD;  Location: [facility];  Service: Gynecology;  Laterality: N/A;   BREAST BIOPSY Left    foot surgery Right    INGUINAL LYMPH NODE BIOPSY Right 06/19/2020   Procedure: RIGHT INGUINAL LYMPH NODE EXCISIONAL BIOPSY;  Surgeon: Lucretia Roers, MD;  Location: [facility];  Service: General;  Laterality: Right;   SALPINGOOPHORECTOMY Bilateral 10/27/2017   Procedure: BILATERAL SALPINGO OOPHORECTOMY;  Surgeon: Lazaro Arms, MD;  Location: [facility];  Service: Gynecology;  Laterality: Bilateral;   TONSILLECTOMY      Her Family History Is Significant For: Family History  Problem Relation Age of Onset   Stroke Mother    Heart disease Mother    Hypertension Mother    Hyperlipidemia Mother    Heart attack Mother    Diabetes Mother    Sudden death Mother    Early death Father        bedridden by age of 40 ?   Alcohol abuse Father    Breast cancer Maternal Aunt    Breast cancer Maternal Uncle    Cancer Paternal Aunt    Diabetes Cousin    Sleep apnea Neg Hx     Her Social History Is Significant For: Social History  Socioeconomic History   Marital status: Married    Spouse name: Not on file   Number of children: 2   Years of education: 14   Highest education level: Not on file  Occupational History   Occupation: retired/disabled    Comment: board of Environmental manager  Tobacco Use   Smoking status: Former    Current packs/day: 0.00    Average packs/day: 0.3 packs/day for 5.0 years (1.3 ttl pk-yrs)    Types: Cigarettes    Start date: 10/22/1978    Quit date: 10/23/1983    Years since quitting: 39.7   Smokeless tobacco: Never  Vaping Use   Vaping status: Never Used  Substance and Sexual Activity   Alcohol use: Yes    Comment: occasional glass of wine   Drug use: No   Sexual activity: Not Currently    Birth  control/protection: Post-menopausal    Comment: widow  Other Topics Concern   Not on file  Social History Narrative   Lives alone   Children in [place]   Has lived in [place] since 2009 - husband was a Insurance underwriter to [employer], swims   reads at [employer]   Social Determinants of Health   Financial Resource Strain: Medium Risk (02/13/2022)   Overall Financial Resource Strain (CARDIA)    Difficulty of Paying Living Expenses: Somewhat hard  Food Insecurity: No Food Insecurity (02/13/2022)   Hunger Vital Sign    Worried About Running Out of Food in the Last Year: Never true    Ran Out of Food in the Last Year: Never true  Transportation Needs: No Transportation Needs (02/13/2022)   PRAPARE - Administrator, Civil Service (Medical): No    Lack of Transportation (Non-Medical): No  Physical Activity: Insufficiently Active (02/13/2022)   Exercise Vital Sign    Days of Exercise per Week: 3 days    Minutes of Exercise per Session: 30 min  Stress: No Stress Concern Present (02/13/2022)   Yesenia Montgomery of Occupational Health - Occupational Stress Questionnaire    Feeling of Stress : Not at all  Social Connections: Unknown (02/13/2022)   Social Connection and Isolation Panel [NHANES]    Frequency of Communication with Friends and Family: Once a week    Frequency of Social Gatherings with Friends and Family: Patient declined    Attends Religious Services: More than 4 times per year    Active Member of [employer] or Organizations: No    Attends Banker Meetings: Never    Marital Status: Patient declined    Her Allergies Are:  Allergies  Allergen Reactions   Metformin And Related Nausea And Vomiting   Latex Rash    Waist trainer belt  :   Her Current Medications Are:  Outpatient Encounter Medications as of 07/22/2023  Medication Sig   amLODipine (NORVASC) 5 MG tablet Take 1 tablet (5 mg total) by mouth daily.   cholecalciferol (VITAMIN D3) 25 MCG (1000 UNIT) tablet Take  1,000 Units by mouth daily.   Cyanocobalamin (VITAMIN B 12) 500 MCG TABS Take 1,000 mcg by mouth daily at 12 noon.   levocetirizine (XYZAL) 5 MG tablet Take 5 mg by mouth daily before breakfast.   montelukast (SINGULAIR) 10 MG tablet Take 1 tablet (10 mg total) by mouth at bedtime.   nebivolol (BYSTOLIC) 10 MG tablet Take 1 tablet (10 mg total) by mouth daily.   Potassium 99 MG TABS Take 10 mg by mouth daily.   Rosuvastatin  Calcium 5 MG CPSP Take by mouth.   tirzepatide (MOUNJARO) 2.5 MG/0.5ML Pen Inject 2.5 mg into the skin once a week.   losartan (COZAAR) 50 MG tablet Take 100 mg by mouth daily.   No facility-administered encounter medications on file as of 07/22/2023.  :   Review of Systems:  Out of a complete 14 point review of systems, all are reviewed and negative with the exception of these symptoms as listed below:  Review of Systems  Neurological:        Pt here for sleep study consult  Pt snore,hypertension,fatigue  Pt denies sleep study,cpap machine , headaches    ESS FSS    Objective:  Neurological Exam  Physical Exam Physical Examination:   Vitals:   07/22/23 0949  BP: 137/84  Pulse: 74    General Examination: The patient is a very pleasant 67 y.o. female in no acute distress. She appears well-developed and well-nourished and well groomed.   HEENT: Normocephalic, atraumatic, pupils are equal, round and reactive to light, extraocular tracking is good without limitation to gaze excursion or nystagmus noted. Hearing is grossly intact. Face is symmetric with normal facial animation. Speech is clear with no dysarthria noted. There is no hypophonia. There is no lip, neck/head, jaw or voice tremor. Neck is supple with full range of passive and active motion. There are no carotid bruits on auscultation. Oropharynx exam reveals: mild mouth dryness, adequate dental hygiene and mild airway crowding, due to elongated uvula.  Mallampati class I, tonsils absent.  Neck  circumference 16-1/4 inches, minimal overbite noted.  Tongue protrudes centrally and palate elevates symmetrically.  Chest: Clear to auscultation without wheezing, rhonchi or crackles noted.  Heart: S1+S2+0, regular and normal without murmurs, rubs or gallops noted.   Abdomen: Soft, non-tender and non-distended.  Extremities: There is no pitting edema in the distal lower extremities bilaterally.   Skin: Warm and dry without trophic changes noted.   Musculoskeletal: exam reveals no obvious joint deformities.   Neurologically:  Mental status: The patient is awake, alert and oriented in all 4 spheres. Her immediate and remote memory, attention, language skills and fund of knowledge are appropriate. There is no evidence of aphasia, agnosia, apraxia or anomia. Speech is clear with normal prosody and enunciation. Thought process is linear. Mood is normal and affect is normal.  Cranial nerves II - XII are as described above under HEENT exam.  Motor exam: Normal bulk, strength and tone is noted. There is no obvious action or resting tremor.  Fine motor skills and coordination: grossly intact.  Cerebellar testing: No dysmetria or intention tremor. There is no truncal or gait ataxia.  Sensory exam: intact to light touch in the upper and lower extremities.  Gait, station and balance: She stands easily. No veering to one side is noted. No leaning to one side is noted. Posture is age-appropriate and stance is narrow based. Gait shows normal stride length and normal pace. No problems turning are noted.   Assessment and Plan:  In summary, Yesenia Montgomery is a very pleasant 67 y.o.-year old female with an underlying medical history of hypertension, prediabetes, arthritis, hyperlipidemia, vitamin D deficiency, and obesity, whose history and physical exam are concerning for sleep disordered breathing, particularly obstructive sleep apnea (OSA). A  laboratory attended sleep study is typically considered "gold  standard" for evaluation of sleep disordered breathing.   I had a long chat with the patient about my findings and the diagnosis of sleep apnea, particularly OSA, its  prognosis and treatment options. We talked about medical/conservative treatments, surgical interventions and non-pharmacological approaches for symptom control. I explained, in particular, the risks and ramifications of untreated moderate to severe OSA, especially with respect to developing cardiovascular disease down the road, including congestive heart failure (CHF), difficult to treat hypertension, cardiac arrhythmias (particularly A-fib), neurovascular complications including TIA, stroke and dementia. Even type 2 diabetes has, in part, been linked to untreated OSA. Symptoms of untreated OSA may include (but may not be limited to) daytime sleepiness, nocturia (i.e. frequent nighttime urination), memory problems, mood irritability and suboptimally controlled or worsening mood disorder such as depression and/or anxiety, lack of energy, lack of motivation, physical discomfort, as well as recurrent headaches, especially morning or nocturnal headaches. We talked about the importance of maintaining a healthy lifestyle and striving for healthy weight. In addition, we talked about the importance of striving for and maintaining good sleep hygiene. I recommended a sleep study at this time. I outlined the differences between a laboratory attended sleep study which is considered more comprehensive and accurate over the option of a home sleep test (HST); the latter may lead to underestimation of sleep disordered breathing in some instances and does not help with diagnosing upper airway resistance syndrome and is not accurate enough to diagnose primary central sleep apnea typically. I outlined possible surgical and non-surgical treatment options of OSA, including the use of a positive airway pressure (PAP) device (i.e. CPAP, AutoPAP/APAP or BiPAP in certain  circumstances), a custom-made dental device (aka oral appliance, which would require a referral to a specialist dentist or orthodontist typically, and is generally speaking not considered for patients with full dentures or edentulous state), upper airway surgical options, such as traditional UPPP (which is not considered a first-line treatment) or the Inspire device (hypoglossal nerve stimulator, which would involve a referral for consultation with an ENT surgeon, after careful selection, following inclusion criteria - also not first-line treatment). I explained the PAP treatment option to the patient in detail, as this is generally considered first-line treatment.  The patient indicated that she would be willing to try PAP therapy, if the need arises. I explained the importance of being compliant with PAP treatment, not only for insurance purposes but primarily to improve patient's symptoms symptoms, and for the patient's long term health benefit, including to reduce Her cardiovascular risks longer-term.    We will pick up our discussion about the next steps and treatment options after testing.  We will keep her posted as to the test results by phone call and/or MyChart messaging where possible.  We will plan to follow-up in sleep clinic accordingly as well.  I answered all her questions today and the patient was in agreement.   I encouraged her to call with any interim questions, concerns, problems or updates or email [place] through MyChart.  Generally speaking, sleep test authorizations may take up to 2 weeks, sometimes less, sometimes longer, the patient is encouraged to get in touch with [place] if they do not hear back from the sleep lab staff directly within the next 2 weeks.  Thank you very much for allowing me to participate in the care of this nice patient. If I can be of any further assistance to you please do not hesitate to call me at [phone].  Sincerely,   Huston Foley, MD, PhD

## 2023-07-22 NOTE — Patient Instructions (Signed)

## 2023-07-26 ENCOUNTER — Telehealth: Payer: Self-pay | Admitting: Neurology

## 2023-07-26 NOTE — Telephone Encounter (Signed)
[employer]- [employer] pending uploaded notes  HST- [employer] medicare no auth req.

## 2023-08-02 NOTE — Telephone Encounter (Signed)
[employer] Berkley Harvey: [id number] (exp. 07/26/23 to 01/22/24)

## 2023-08-03 NOTE — Telephone Encounter (Signed)
LVM for pt to call back to schedule   [employer] medic[employer] auth: [id number] (exp. 07/26/23 to 01/22/24)

## 2023-08-11 NOTE — Telephone Encounter (Signed)
Left voicemail for patient to call back to schedule SS.

## 2023-08-16 ENCOUNTER — Encounter (INDEPENDENT_AMBULATORY_CARE_PROVIDER_SITE_OTHER): Payer: Self-pay | Admitting: Internal Medicine

## 2023-08-16 ENCOUNTER — Ambulatory Visit (INDEPENDENT_AMBULATORY_CARE_PROVIDER_SITE_OTHER): Payer: Medicare HMO | Admitting: Internal Medicine

## 2023-08-16 DIAGNOSIS — E669 Obesity, unspecified: Secondary | ICD-10-CM

## 2023-08-16 DIAGNOSIS — I10 Essential (primary) hypertension: Secondary | ICD-10-CM | POA: Diagnosis not present

## 2023-08-16 DIAGNOSIS — R7303 Prediabetes: Secondary | ICD-10-CM

## 2023-08-16 DIAGNOSIS — Z6836 Body mass index (BMI) 36.0-36.9, adult: Secondary | ICD-10-CM | POA: Diagnosis not present

## 2023-08-16 MED ORDER — TIRZEPATIDE 2.5 MG/0.5ML ~~LOC~~ SOAJ: 2.5000 mg | SUBCUTANEOUS | 1 refills | Status: DC

## 2023-08-16 NOTE — Progress Notes (Signed)
Office: [phone]  /  Fax: [phone]  Weight Summary And Biometrics  Vitals Temp: 98 F (36.7 C) BP: 115/73 Pulse Rate: 76 SpO2: 100 %   Anthropometric Measurements Height: 5\' 7"  (1.702 m) Weight: 230 lb (104.3 kg) BMI (Calculated): 36.01 Weight at Last Visit: 237 lb Weight Lost Since Last Visit: 7 lb Weight Gained Since Last Visit: 0 lb Starting Weight: 240 lb Total Weight Loss (lbs): 10 lb (4.536 kg) Peak Weight: 276 lb   Body Composition  Body Fat %: 48.5 % Fat Mass (lbs): 111.8 lbs Muscle Mass (lbs): 112.8 lbs Total Body Water (lbs): 82 lbs Visceral Fat Rating : 15    No data recorded Today's Visit #: 13  Starting Date: 10/21/22   Subjective   Chief Complaint: Obesity  Yesenia Montgomery is here to discuss her progress with her obesity treatment plan. She is on the the Category 1 Plan and states she is following her eating plan approximately 100 % of the time. She states she is exercising 80 minutes 2 times per week.  Interval History:   Since last office visit she has lost 7 pounds. She reports excellent adherence to reduced calorie nutritional plan. She has been working on reading food labels, not skipping meals, increasing protein intake at every meal, drinking more water, making healthier choices, reducing portion sizes, and incorporating more whole foods   Orexigenic Control:  Denies problems with appetite and hunger signals.  Denies problems with satiety and satiation.  Denies problems with eating patterns and portion control.  Denies abnormal cravings. Denies feeling deprived or restricted. Has noticed improvement since starting Monjauro   Barriers identified: slow metabolism for age.   Pharmacotherapy for weight loss: She is currently taking Monjauro with diabetes as the primary indication with adequate clinical response  and without side effects..   Assessment and Plan   Treatment Plan For Obesity:  Recommended Dietary Goals  Yesenia Montgomery is  currently in the action stage of change. As such, her goal is to continue weight management plan. She has agreed to: continue current plan  Behavioral Intervention  We discussed the following Behavioral Modification Strategies today: continue to work on maintaining a reduced calorie state, getting the recommended amount of protein, incorporating whole foods, making healthy choices, staying well hydrated and practicing mindfulness when eating..  Additional resources provided today: None  Recommended Physical Activity Goals  Yesenia Montgomery has been advised to work up to 150 minutes of moderate intensity aerobic activity a week and strengthening exercises 2-3 times per week for cardiovascular health, weight loss maintenance and preservation of muscle mass.   She has agreed to :  continue to gradually increase the amount and intensity of exercise routine  Pharmacotherapy  We discussed various medication options to help Yesenia Montgomery with her weight loss efforts and we both agreed to : continue current anti-obesity medication regimen  Associated Conditions Addressed Today  Essential hypertension Assessment & Plan: Her blood pressure has improved substantially.  She has been keeping a log and since November they are close to goal.  She is currently on amlodipine, losartan and Bystolic without any orthostatic dizziness or adverse effects.  She will continue current regimen monitor for orthostasis while losing weight.  May be able to come off Bystolic in the future.  Orders: -     Tirzepatide; Inject 2.5 mg into the skin once a week.  Dispense: 2 mL; Refill: 1  Obesity - (Start BMI 37.57 Assessment & Plan: See obesity treatment plan  Orders: -  Tirzepatide; Inject 2.5 mg into the skin once a week.  Dispense: 2 mL; Refill: 1  Pre-diabetes Assessment & Plan: Most recent A1c is  Lab Results  Component Value Date   HGBA1C 5.8 (H) 10/21/2022   HGBA1C 5.6 03/04/2017    She continues to make  nutritional changes and has increased physical activity.  We tried metformin for pharmacoprophylaxis and she did not tolerate medication.  She is now on Mounjaro 2.5 mg once a week with good clinical response.  She will continue at current dose   Orders: -     Tirzepatide; Inject 2.5 mg into the skin once a week.  Dispense: 2 mL; Refill: 1     Objective   Physical Exam:  Blood pressure 115/73, pulse 76, temperature 98 F (36.7 C), height 5\' 7"  (1.702 m), weight 230 lb (104.3 kg), SpO2 100%. Body mass index is 36.02 kg/m.  General: She is overweight, cooperative, alert, well developed, and in no acute distress. PSYCH: Has normal mood, affect and thought process.   HEENT: EOMI, sclerae are anicteric. Lungs: Normal breathing effort, no conversational dyspnea. Extremities: No edema.  Neurologic: No gross sensory or motor deficits. No tremors or fasciculations noted.    Diagnostic Data Reviewed:  BMET    Component Value Date/Time   NA 140 10/21/2022 1050   K 4.6 10/21/2022 1050   CL 100 10/21/2022 1050   CO2 26 10/21/2022 1050   GLUCOSE 97 10/21/2022 1050   GLUCOSE 110 (H) 06/04/2020 1435   BUN 16 10/21/2022 1050   CREATININE 0.97 10/21/2022 1050   CREATININE 0.90 03/04/2017 1404   CALCIUM 10.2 10/21/2022 1050   GFRNONAA >60 06/04/2020 1435   GFRNONAA 69 03/04/2017 1404   GFRAA >60 10/28/2017 0418   GFRAA 79 03/04/2017 1404   Lab Results  Component Value Date   HGBA1C 5.8 (H) 10/21/2022   HGBA1C 5.6 03/04/2017   Lab Results  Component Value Date   INSULIN 12.7 10/21/2022   Lab Results  Component Value Date   TSH 1.350 10/21/2022   CBC    Component Value Date/Time   WBC 5.9 06/04/2020 1435   RBC 4.46 06/04/2020 1435   HGB 13.1 06/04/2020 1435   HCT 41.6 06/04/2020 1435   PLT 269 06/04/2020 1435   MCV 93.3 06/04/2020 1435   MCH 29.4 06/04/2020 1435   MCHC 31.5 06/04/2020 1435   RDW 13.4 06/04/2020 1435   Iron Studies No results found for: "IRON",  "TIBC", "FERRITIN", "IRONPCTSAT" Lipid Panel     Component Value Date/Time   CHOL 216 (H) 10/21/2022 1050   TRIG 164 (H) 10/21/2022 1050   HDL 45 10/21/2022 1050   CHOLHDL 5.3 (H) 03/04/2017 1404   VLDL 46 (H) 03/04/2017 1404   LDLCALC 141 (H) 10/21/2022 1050   Hepatic Function Panel     Component Value Date/Time   PROT 7.9 10/21/2022 1050   ALBUMIN 4.5 10/21/2022 1050   AST 24 10/21/2022 1050   ALT 20 10/21/2022 1050   ALKPHOS 74 10/21/2022 1050   BILITOT 0.5 10/21/2022 1050      Component Value Date/Time   TSH 1.350 10/21/2022 1050   Nutritional Lab Results  Component Value Date   VD25OH 50.8 10/21/2022   VD25OH 28 (L) 03/04/2017    Follow-Up   Return in about 4 weeks (around 09/13/2023) for For Weight Mangement with Dr. Rikki Spearing.Marland Kitchen She was informed of the importance of frequent follow up visits to maximize her success with intensive lifestyle modifications for her multiple  health conditions.  Attestation Statement   Reviewed by clinician on day of visit: allergies, medications, problem list, medical history, surgical history, family history, social history, and previous encounter notes.     Worthy Rancher, MD

## 2023-08-16 NOTE — Assessment & Plan Note (Signed)
Her blood pressure has improved substantially.  She has been keeping a log and since November they are close to goal.  She is currently on amlodipine, losartan and Bystolic without any orthostatic dizziness or adverse effects.  She will continue current regimen monitor for orthostasis while losing weight.  May be able to come off Bystolic in the future.

## 2023-08-16 NOTE — Assessment & Plan Note (Signed)
 See obesity treatment plan

## 2023-08-16 NOTE — Progress Notes (Deleted)
Office: [phone]  /  Fax: [phone]  Weight Summary And Biometrics  No data recorded Anthropometric Measurements Height: 5\' 7"  (1.702 m) Weight at Last Visit: 237 lb Starting Weight: 240 lb Peak Weight: 276 lb   No data recorded  No data recorded Today's Visit #: 13  Starting Date: 10/21/22   Subjective   Chief Complaint: Obesity  Yesenia Montgomery is here to discuss her progress with her obesity treatment plan. She is on the the Category 1 Plan and states she is following her eating plan approximately 100 % of the time. She states she is exercising 80 minutes 1 times per week.  Interval History:   Since last office visit she has {emweight change:30888}. She reports {EMADHERENCE:28838::"good adherence to reduced calorie nutritional plan."} She has been working on [employer] labels","not skipping meals","increasing protein intake at every meal","drinking more water","making healthier choices","reducing portion sizes","incorporating more whole foods"}   Orexigenic Control:  {ACTIONS;DENIES/REPORTS:21021675::"Denies"} problems with appetite and hunger signals.  {ACTIONS;DENIES/REPORTS:21021675::"Denies"} problems with satiety and satiation.  {ACTIONS;DENIES/REPORTS:21021675::"Denies"} problems with eating patterns and portion control.  {ACTIONS;DENIES/REPORTS:21021675::"Denies"} abnormal cravings. {ACTIONS;DENIES/REPORTS:21021675::"Denies"} feeling deprived or restricted.   Barriers identified: {EMOBESITYBARRIERS:28841}.   Pharmacotherapy for weight loss: She is currently taking {EMPharmaco:28845}.   Assessment and Plan   Treatment Plan For Obesity:  Recommended Dietary Goals  Yesenia Montgomery is currently in the action stage of change. As such, her goal is to continue weight management plan. She has agreed to: {EMWTLOSSPLAN:29297::"continue current plan"}  Behavioral Intervention  We discussed the following Behavioral Modification Strategies today:  {EMWMwtlossstrategies:28914::"continue to work on maintaining a reduced calorie state, getting the recommended amount of protein, incorporating whole foods, making healthy choices, staying well hydrated and practicing mindfulness when eating."}.  Additional resources provided today: {EMadditionalresources:29169::"None"}  Recommended Physical Activity Goals  Yesenia Montgomery has been advised to work up to 150 minutes of moderate intensity aerobic activity a week and strengthening exercises 2-3 times per week for cardiovascular health, weight loss maintenance and preservation of muscle mass.   She has agreed to :  {EMEXERCISE:28847::"Think about enjoyable ways to increase daily physical activity and overcoming barriers to exercise","Increase physical activity in their day and reduce sedentary time (increase NEAT)."}  Pharmacotherapy  We discussed various medication options to help Debborah with her weight loss efforts and we both agreed to : {EMagreedrx:29170}  Associated Conditions Addressed Today  Essential hypertension  Obesity - (Start BMI 37.57  Pre-diabetes     Objective   Physical Exam:  Height 5\' 7"  (1.702 m). Body mass index is 37.43 kg/m.  General: She is overweight, cooperative, alert, well developed, and in no acute distress. PSYCH: Has normal mood, affect and thought process.   HEENT: EOMI, sclerae are anicteric. Lungs: Normal breathing effort, no conversational dyspnea. Extremities: No edema.  Neurologic: No gross sensory or motor deficits. No tremors or fasciculations noted.    Diagnostic Data Reviewed:  BMET    Component Value Date/Time   NA 140 10/21/2022 1050   K 4.6 10/21/2022 1050   CL 100 10/21/2022 1050   CO2 26 10/21/2022 1050   GLUCOSE 97 10/21/2022 1050   GLUCOSE 110 (H) 06/04/2020 1435   BUN 16 10/21/2022 1050   CREATININE 0.97 10/21/2022 1050   CREATININE 0.90 03/04/2017 1404   CALCIUM 10.2 10/21/2022 1050   GFRNONAA >60 06/04/2020 1435   GFRNONAA  69 03/04/2017 1404   GFRAA >60 10/28/2017 0418   GFRAA 79 03/04/2017 1404   Lab Results  Component Value Date   HGBA1C 5.8 (H) 10/21/2022  HGBA1C 5.6 03/04/2017   Lab Results  Component Value Date   INSULIN 12.7 10/21/2022   Lab Results  Component Value Date   TSH 1.350 10/21/2022   CBC    Component Value Date/Time   WBC 5.9 06/04/2020 1435   RBC 4.46 06/04/2020 1435   HGB 13.1 06/04/2020 1435   HCT 41.6 06/04/2020 1435   PLT 269 06/04/2020 1435   MCV 93.3 06/04/2020 1435   MCH 29.4 06/04/2020 1435   MCHC 31.5 06/04/2020 1435   RDW 13.4 06/04/2020 1435   Iron Studies No results found for: "IRON", "TIBC", "FERRITIN", "IRONPCTSAT" Lipid Panel     Component Value Date/Time   CHOL 216 (H) 10/21/2022 1050   TRIG 164 (H) 10/21/2022 1050   HDL 45 10/21/2022 1050   CHOLHDL 5.3 (H) 03/04/2017 1404   VLDL 46 (H) 03/04/2017 1404   LDLCALC 141 (H) 10/21/2022 1050   Hepatic Function Panel     Component Value Date/Time   PROT 7.9 10/21/2022 1050   ALBUMIN 4.5 10/21/2022 1050   AST 24 10/21/2022 1050   ALT 20 10/21/2022 1050   ALKPHOS 74 10/21/2022 1050   BILITOT 0.5 10/21/2022 1050      Component Value Date/Time   TSH 1.350 10/21/2022 1050   Nutritional Lab Results  Component Value Date   VD25OH 50.8 10/21/2022   VD25OH 28 (L) 03/04/2017    Follow-Up   No follow-ups on file.Marland Kitchen She was informed of the importance of frequent follow up visits to maximize her success with intensive lifestyle modifications for her multiple health conditions.  Attestation Statement   Reviewed by clinician on day of visit: allergies, medications, problem list, medical history, surgical history, family history, social history, and previous encounter notes.     Worthy Rancher, MD

## 2023-08-16 NOTE — Assessment & Plan Note (Signed)
Most recent A1c is  Lab Results  Component Value Date   HGBA1C 5.8 (H) 10/21/2022   HGBA1C 5.6 03/04/2017    She continues to make nutritional changes and has increased physical activity.  We tried metformin for pharmacoprophylaxis and she did not tolerate medication.  She is now on Mounjaro 2.5 mg once a week with good clinical response.  She will continue at current dose

## 2023-08-18 ENCOUNTER — Telehealth (INDEPENDENT_AMBULATORY_CARE_PROVIDER_SITE_OTHER): Payer: Self-pay

## 2023-08-18 NOTE — Telephone Encounter (Signed)
Prior auth denied [place]: [facility]  Appeal sent 08/18/23

## 2023-08-19 ENCOUNTER — Encounter (INDEPENDENT_AMBULATORY_CARE_PROVIDER_SITE_OTHER): Payer: Self-pay

## 2023-08-19 NOTE — Telephone Encounter (Signed)
Appeal denied.   Your Medicare Part D drug plan limits the quantity of the requested drug covered over a certain period of time. We must deny this request for the quantity limit exception because (diagnosis of prediabetes) did not meet the requirement of a medically accepted indication.

## 2023-09-21 ENCOUNTER — Ambulatory Visit (INDEPENDENT_AMBULATORY_CARE_PROVIDER_SITE_OTHER): Payer: Medicare HMO | Admitting: Internal Medicine

## 2023-09-21 ENCOUNTER — Encounter (INDEPENDENT_AMBULATORY_CARE_PROVIDER_SITE_OTHER): Payer: Self-pay | Admitting: Internal Medicine

## 2023-09-21 VITALS — BP 130/85 | HR 76 | Temp 97.6°F | Ht 67.0 in | Wt 237.0 lb

## 2023-09-21 DIAGNOSIS — E669 Obesity, unspecified: Secondary | ICD-10-CM

## 2023-09-21 DIAGNOSIS — R948 Abnormal results of function studies of other organs and systems: Secondary | ICD-10-CM

## 2023-09-21 DIAGNOSIS — K760 Fatty (change of) liver, not elsewhere classified: Secondary | ICD-10-CM | POA: Diagnosis not present

## 2023-09-21 DIAGNOSIS — Z6837 Body mass index (BMI) 37.0-37.9, adult: Secondary | ICD-10-CM

## 2023-09-21 DIAGNOSIS — R7303 Prediabetes: Secondary | ICD-10-CM

## 2023-09-21 DIAGNOSIS — E66812 Obesity, class 2: Secondary | ICD-10-CM

## 2023-09-21 MED ORDER — TOPIRAMATE 25 MG PO TABS
25.0000 mg | ORAL_TABLET | Freq: Every evening | ORAL | 0 refills | Status: DC
Start: 2023-09-21 — End: 2023-10-19

## 2023-09-21 NOTE — Assessment & Plan Note (Addendum)
I reviewed ultrasound from October that showed hepatic steatosis.  After her visit I was able to locate some of her labs from the fall of last year in Care Everywhere, and I noticed that her LFTs are elevated again about 2 fold.  I am not sure what is being done about this but we will follow-up with her at her next visit.  We will complete first tier evaluation if not completed.

## 2023-09-21 NOTE — Assessment & Plan Note (Addendum)
Most recent A1c is  Lab Results  Component Value Date   HGBA1C 5.8 (H) 10/21/2022   HGBA1C 5.6 03/04/2017    She continues to make nutritional changes and has increased physical activity.  We tried metformin for pharmacoprophylaxis and she did not tolerate medication.  Greggory Keen is no longer covered by her insurance for pharmacoprophylaxis.  She will continue with nutrition and behavioral strategies.

## 2023-09-21 NOTE — Assessment & Plan Note (Signed)
Patient has a slower than predicted metabolism. IC 1123 vs. calculated 1734. This may contribute to weight gain, chronic fatigue and difficulty losing weight.  We reviewed measures to improve metabolism including not skipping meals, progressive strengthening exercises, increasing protein intake at every meal and maintaining adequate hydration and sleep.

## 2023-09-21 NOTE — Progress Notes (Signed)
Office: [phone]  /  Fax: [phone]  Weight Summary And Biometrics  Vitals Temp: 97.6 F (36.4 C) BP: 130/85 Pulse Rate: 76 SpO2: 98 %   Anthropometric Measurements Height: 5\' 7"  (1.702 m) Weight: 237 lb (107.5 kg) BMI (Calculated): 37.11 Weight at Last Visit: 230 lb Weight Lost Since Last Visit: 0 lb Weight Gained Since Last Visit: 7 lb Starting Weight: 240 lb Total Weight Loss (lbs): 3 lb (1.361 kg) Peak Weight: 276 lb   Body Composition  Body Fat %: 49.6 % Fat Mass (lbs): 117.8 lbs Muscle Mass (lbs): 113.8 lbs Total Body Water (lbs): 84 lbs Visceral Fat Rating : 16    No data recorded Today's Visit #: 14  Starting Date: 10/21/22   Subjective   Chief Complaint: Obesity  Yesenia Montgomery is here to discuss her progress with her obesity treatment plan. She is on the the Category 1 Plan and states she is following her eating plan approximately 100 % of the time. She states she is exercising 15 minutes 3-4 times per week.  Weight Progress Since Last Visit:  Discussed the use of AI scribe software for clinical note transcription with the patient, who gave verbal consent to proceed.  History of Present Illness   The patient, affected by obesity, abnormal metabolism, hypertension, MASLD, and prediabetes, presents today for medical weight management. She reports a weight gain of seven pounds since the last office visit. The patient was previously prescribed Mounjaro for prediabetes, but her insurance has stopped covering the medication. Despite this, the patient has been trying to maintain a healthy diet and exercise routine. She reports not having much of an appetite and eating healthily three times a day, even when not hungry. However, she has been struggling with physical activity due to chronic pain.      Patient Reported Barriers to Progress: cost of medication, orthopedic problems, medical conditions or chronic pain affecting mobility, slow metabolism for age,  and menopause.   Orexigenic Control: Denies problems with appetite and hunger signals.  Denies problems with satiety and satiation.  Denies problems with eating patterns and portion control.  Denies abnormal cravings. Denies feeling deprived or restricted.   Pharmacotherapy for weight management: She is currently taking no anti-obesity medication and was on Mounjaro but medication is being denied by her insurance .   Assessment and Plan   Treatment Plan For Obesity:  Recommended Dietary Goals  Ondrea is currently in the action stage of change. As such, her goal is to continue weight management plan. She has agreed to: continue current plan  Behavioral Health and Counseling  We discussed the following behavioral modification strategies today: continue to work on maintaining a reduced calorie state, getting the recommended amount of protein, incorporating whole foods, making healthy choices, staying well hydrated and practicing mindfulness when eating..  Additional education and resources provided today: None  Recommended Physical Activity Goals  Tishia has been advised to work up to 150 minutes of moderate intensity aerobic activity a week and strengthening exercises 2-3 times per week for cardiovascular health, weight loss maintenance and preservation of muscle mass.   She has agreed to :  Think about enjoyable ways to increase daily physical activity and overcoming barriers to exercise and Increase physical activity in their day and reduce sedentary time (increase NEAT).  Pharmacotherapy  We discussed various medication options to help Monzerrat with her weight loss efforts and we both agreed to : start anti-obesity medication.  In addition to reduced calorie nutrition plan (RCNP),  behavioral strategies and physical activity, Luma would benefit from pharmacotherapy to assist with hunger signals, satiety and cravings. This will reduce obesity-related health risks by inducing  weight loss, and help reduce food consumption and adherence to [facility]) . It may also improve QOL by improving self-confidence and reduce the  setbacks associated with metabolic adaptations.  After discussion of treatment options, mechanisms of action, benefits, side effects, contraindications and shared decision making she is agreeable to starting topiramate 25 mg in the evening. Patient also made aware that medication is indicated for long-term management of obesity and the risk of weight regain following discontinuation of treatment and hence the importance of adhering to medical weight loss plan.    Associated Conditions Impacted by Obesity Treatment  Metabolic dysfunction-associated steatotic liver disease (MASLD) Assessment & Plan: I reviewed ultrasound from October that showed hepatic steatosis.  After her visit I was able to locate some of her labs from the fall of last year in Care Everywhere, and I noticed that her LFTs are elevated again about 2 fold.  I am not sure what is being done about this but we will follow-up with her at her next visit.  We will complete first tier evaluation if not completed.     Obesity - (Start BMI 37.57 Assessment & Plan: [name] has gained 7 pounds due to a combination of losing coverage for GLP-1 and decrease in physical activity levels.  She is also menopausal and has a slow metabolism.  She continues to follow reduced calorie nutrition plan.  Her insurance no longer covers Mounjaro.  After discussion of benefits and side effect she will be started on topiramate 25 mg in the evening.  She will look at ways to increase physical activity making modifications based on her limitations.  Orders: -     Topiramate; Take 1 tablet (25 mg total) by mouth every evening.  Dispense: 30 tablet; Refill: 0  Pre-diabetes Assessment & Plan: Most recent A1c is  Lab Results  Component Value Date   HGBA1C 5.8 (H) 10/21/2022   HGBA1C 5.6 03/04/2017    She continues to  make nutritional changes and has increased physical activity.  We tried metformin for pharmacoprophylaxis and she did not tolerate medication.  Greggory Keen is no longer covered by her insurance for pharmacoprophylaxis.  She will continue with nutrition and behavioral strategies.    Abnormal metabolism Assessment & Plan: Patient has a slower than predicted metabolism. IC 1123 vs. calculated 1734. This may contribute to weight gain, chronic fatigue and difficulty losing weight.   We reviewed measures to improve metabolism including not skipping meals, progressive strengthening exercises, increasing protein intake at every meal and maintaining adequate hydration and sleep.              Objective   Physical Exam:  Blood pressure 130/85, pulse 76, temperature 97.6 F (36.4 C), height 5\' 7"  (1.702 m), weight 237 lb (107.5 kg), SpO2 98%. Body mass index is 37.12 kg/m.  General: She is overweight, cooperative, alert, well developed, and in no acute distress. PSYCH: Has normal mood, affect and thought process.   HEENT: EOMI, sclerae are anicteric. Lungs: Normal breathing effort, no conversational dyspnea. Extremities: No edema.  Neurologic: No gross sensory or motor deficits. No tremors or fasciculations noted.    Diagnostic Data Reviewed:  BMET    Component Value Date/Time   NA 140 10/21/2022 1050   K 4.6 10/21/2022 1050   CL 100 10/21/2022 1050   CO2 26 10/21/2022 1050  GLUCOSE 97 10/21/2022 1050   GLUCOSE 110 (H) 06/04/2020 1435   BUN 16 10/21/2022 1050   CREATININE 0.97 10/21/2022 1050   CREATININE 0.90 03/04/2017 1404   CALCIUM 10.2 10/21/2022 1050   GFRNONAA >60 06/04/2020 1435   GFRNONAA 69 03/04/2017 1404   GFRAA >60 10/28/2017 0418   GFRAA 79 03/04/2017 1404   Lab Results  Component Value Date   HGBA1C 5.8 (H) 10/21/2022   HGBA1C 5.6 03/04/2017   Lab Results  Component Value Date   INSULIN 12.7 10/21/2022   Lab Results  Component Value Date   TSH  1.350 10/21/2022   CBC    Component Value Date/Time   WBC 5.9 06/04/2020 1435   RBC 4.46 06/04/2020 1435   HGB 13.1 06/04/2020 1435   HCT 41.6 06/04/2020 1435   PLT 269 06/04/2020 1435   MCV 93.3 06/04/2020 1435   MCH 29.4 06/04/2020 1435   MCHC 31.5 06/04/2020 1435   RDW 13.4 06/04/2020 1435   Iron Studies No results found for: "IRON", "TIBC", "FERRITIN", "IRONPCTSAT" Lipid Panel     Component Value Date/Time   CHOL 216 (H) 10/21/2022 1050   TRIG 164 (H) 10/21/2022 1050   HDL 45 10/21/2022 1050   CHOLHDL 5.3 (H) 03/04/2017 1404   VLDL 46 (H) 03/04/2017 1404   LDLCALC 141 (H) 10/21/2022 1050   Hepatic Function Panel     Component Value Date/Time   PROT 7.9 10/21/2022 1050   ALBUMIN 4.5 10/21/2022 1050   AST 24 10/21/2022 1050   ALT 20 10/21/2022 1050   ALKPHOS 74 10/21/2022 1050   BILITOT 0.5 10/21/2022 1050      Component Value Date/Time   TSH 1.350 10/21/2022 1050   Nutritional Lab Results  Component Value Date   VD25OH 50.8 10/21/2022   VD25OH 28 (L) 03/04/2017    Follow-Up   Return in about 4 weeks (around 10/19/2023) for For Weight Mangement with Dr. Rikki Spearing.Marland Kitchen She was informed of the importance of frequent follow up visits to maximize her success with intensive lifestyle modifications for her multiple health conditions.  Attestation Statement   Reviewed by clinician on day of visit: allergies, medications, problem list, medical history, surgical history, family history, social history, and previous encounter notes.     Worthy Rancher, MD

## 2023-09-21 NOTE — Assessment & Plan Note (Signed)
Yesenia Montgomery has gained 7 pounds due to a combination of losing coverage for GLP-1 and decrease in physical activity levels.  She is also menopausal and has a slow metabolism.  She continues to follow reduced calorie nutrition plan.  Her insurance no longer covers Mounjaro.  After discussion of benefits and side effect she will be started on topiramate 25 mg in the evening.  She will look at ways to increase physical activity making modifications based on her limitations.

## 2023-10-11 DIAGNOSIS — R7301 Impaired fasting glucose: Secondary | ICD-10-CM | POA: Diagnosis not present

## 2023-10-19 ENCOUNTER — Encounter (INDEPENDENT_AMBULATORY_CARE_PROVIDER_SITE_OTHER): Payer: Self-pay | Admitting: Internal Medicine

## 2023-10-19 ENCOUNTER — Ambulatory Visit (INDEPENDENT_AMBULATORY_CARE_PROVIDER_SITE_OTHER): Payer: Medicare HMO | Admitting: Internal Medicine

## 2023-10-19 VITALS — BP 136/82 | HR 70 | Temp 97.7°F | Ht 67.0 in | Wt 239.0 lb

## 2023-10-19 DIAGNOSIS — K760 Fatty (change of) liver, not elsewhere classified: Secondary | ICD-10-CM | POA: Diagnosis not present

## 2023-10-19 DIAGNOSIS — E66812 Obesity, class 2: Secondary | ICD-10-CM

## 2023-10-19 DIAGNOSIS — Z6837 Body mass index (BMI) 37.0-37.9, adult: Secondary | ICD-10-CM

## 2023-10-19 DIAGNOSIS — R948 Abnormal results of function studies of other organs and systems: Secondary | ICD-10-CM

## 2023-10-19 DIAGNOSIS — R7303 Prediabetes: Secondary | ICD-10-CM | POA: Diagnosis not present

## 2023-10-19 DIAGNOSIS — I10 Essential (primary) hypertension: Secondary | ICD-10-CM

## 2023-10-19 MED ORDER — AMLODIPINE BESYLATE 5 MG PO TABS: 5.0000 mg | ORAL_TABLET | Freq: Every day | ORAL | 0 refills | Status: DC

## 2023-10-19 NOTE — Progress Notes (Signed)
 Office: [phone]  /  Fax: [phone]  Weight Summary And Biometrics  Vitals Temp: 97.7 F (36.5 C) BP: 136/82 Pulse Rate: 70 SpO2: 98 %   Anthropometric Measurements Height: 5\' 7"  (1.702 m) Weight: 239 lb (108.4 kg) BMI (Calculated): 37.42 Weight at Last Visit: 237 lb Weight Lost Since Last Visit: 0 lb Weight Gained Since Last Visit: 2 lb Starting Weight: 240 lb Total Weight Loss (lbs): 1 lb (0.454 kg) Peak Weight: 276 lb   Body Composition  Body Fat %: 51.3 % Fat Mass (lbs): 122.6 lbs Muscle Mass (lbs): 110.6 lbs Total Body Water (lbs): 86.2 lbs Visceral Fat Rating : 16    No data recorded Today's Visit #: 15  Starting Date: 10/21/22   Subjective   Chief Complaint: Obesity  Eduardo is here to discuss her progress with her obesity treatment plan. She is on the the Category 1 Plan and states she is following her eating plan approximately 65 % of the time. She states she is not exercising.  Weight Progress Since Last Visit:  Since last office visit she has gained 2 pounds. She reports good adherence to reduced calorie nutritional plan. She has been working on reading food labels, not skipping meals, increasing protein intake at every meal, drinking more water, making healthier choices, reducing portion sizes, and incorporating more whole foods .  Reports not being able to exercise due to the weather.  Challenges affecting patient progress: low volume of physical activity at present , slow metabolism for age, and menopause.   Orexigenic Control: Denies problems with appetite and hunger signals.  Denies problems with satiety and satiation.  Denies problems with eating patterns and portion control.  Denies abnormal cravings. Denies feeling deprived or restricted.   Pharmacotherapy for weight management: She is currently taking no anti-obesity medication and had been on Mounjaro for 2 to 3 months but medication was no longer covered by her insurance .    Assessment and Plan   Treatment Plan For Obesity:  Recommended Dietary Goals  Dandra is currently in the action stage of change. As such, her goal is to continue weight management plan. She has agreed to: continue current plan  Behavioral Health and Counseling  We discussed the following behavioral modification strategies today: continue to work on maintaining a reduced calorie state, getting the recommended amount of protein, incorporating whole foods, making healthy choices, staying well hydrated and practicing mindfulness when eating..  Additional education and resources provided today: None  Recommended Physical Activity Goals  Greidy has been advised to work up to 150 minutes of moderate intensity aerobic activity a week and strengthening exercises 2-3 times per week for cardiovascular health, weight loss maintenance and preservation of muscle mass.   She has agreed to :  Think about enjoyable ways to increase daily physical activity and overcoming barriers to exercise and Increase physical activity in their day and reduce sedentary time (increase NEAT).  Pharmacotherapy  We discussed various medication options to help Daija with her weight loss efforts and we both agreed to : start anti-obesity medication.  In addition to reduced calorie nutrition plan (RCNP), behavioral strategies and physical activity, Lindsey would benefit from pharmacotherapy to assist with hunger signals, satiety and cravings. This will reduce obesity-related health risks by inducing weight loss, and help reduce food consumption and adherence to [facility]) . It may also improve QOL by improving self-confidence and reduce the  setbacks associated with metabolic adaptations.  After discussion of treatment options, mechanisms of action, benefits,  side effects, contraindications and shared decision making she is agreeable to starting topiramate 25 mg in the evening. Patient also made aware that medication is  indicated for long-term management of obesity and the risk of weight regain following discontinuation of treatment and hence the importance of adhering to medical weight loss plan.   Associated Conditions Impacted by Obesity Treatment  Class 2 severe obesity due to excess calories with serious comorbidity and body mass index (BMI) of 37.0 to 37.9 in adult [facility])  Essential hypertension -     amLODIPine Besylate; Take 1 tablet (5 mg total) by mouth daily.  Dispense: 90 tablet; Refill: 0  Pre-diabetes  Metabolic dysfunction-associated steatotic liver disease (MASLD)  Abnormal metabolism    Plan: Willye has started an upward trend in her weight after discontinuation of GLP-1.  She reports good adherence to reduced calorie nutrition plan but her physical activity levels have decreased because of the weather.  She has a slow metabolic rate and therefore benefits from increasing physical activity.  She also had a good response to GLP-1 but medication is no longer covered.  Although she does not report problems with appetite and satiety she does benefit from appetite suppression.  We had prescribed topiramate but she was concerned about taking an antiseizure medication.  Her concerns were addressed today and she is willing to try medication off label single agent.  We may be able to add low-dose phentermine if her blood pressure is well-controlled.  She did not tolerate metformin which was prescribed for prediabetes prevention.  I recommend repeating her IC at her next office visit to troubleshoot her slow metabolic rate and weight gain.  She also tells me she had blood work done at her PCPs office.  I logged into care everywhere but was unable to retrieve most recent labs.  She will bring a copy and bring them in for our review at her next visit.  Objective   Physical Exam:  Blood pressure 136/82, pulse 70, temperature 97.7 F (36.5 C), height 5\' 7"  (1.702 m), weight 239 lb (108.4 kg), SpO2  98%. Body mass index is 37.43 kg/m.  General: She is overweight, cooperative, alert, well developed, and in no acute distress. PSYCH: Has normal mood, affect and thought process.   HEENT: EOMI, sclerae are anicteric. Lungs: Normal breathing effort, no conversational dyspnea. Extremities: No edema.  Neurologic: No gross sensory or motor deficits. No tremors or fasciculations noted.    Diagnostic Data Reviewed:  BMET    Component Value Date/Time   NA 140 10/21/2022 1050   K 4.6 10/21/2022 1050   CL 100 10/21/2022 1050   CO2 26 10/21/2022 1050   GLUCOSE 97 10/21/2022 1050   GLUCOSE 110 (H) 06/04/2020 1435   BUN 16 10/21/2022 1050   CREATININE 0.97 10/21/2022 1050   CREATININE 0.90 03/04/2017 1404   CALCIUM 10.2 10/21/2022 1050   GFRNONAA >60 06/04/2020 1435   GFRNONAA 69 03/04/2017 1404   GFRAA >60 10/28/2017 0418   GFRAA 79 03/04/2017 1404   Lab Results  Component Value Date   HGBA1C 5.8 (H) 10/21/2022   HGBA1C 5.6 03/04/2017   Lab Results  Component Value Date   INSULIN 12.7 10/21/2022   Lab Results  Component Value Date   TSH 1.350 10/21/2022   CBC    Component Value Date/Time   WBC 5.9 06/04/2020 1435   RBC 4.46 06/04/2020 1435   HGB 13.1 06/04/2020 1435   HCT 41.6 06/04/2020 1435   PLT 269 06/04/2020 1435  MCV 93.3 06/04/2020 1435   MCH 29.4 06/04/2020 1435   MCHC 31.5 06/04/2020 1435   RDW 13.4 06/04/2020 1435   Iron Studies No results found for: "IRON", "TIBC", "FERRITIN", "IRONPCTSAT" Lipid Panel     Component Value Date/Time   CHOL 216 (H) 10/21/2022 1050   TRIG 164 (H) 10/21/2022 1050   HDL 45 10/21/2022 1050   CHOLHDL 5.3 (H) 03/04/2017 1404   VLDL 46 (H) 03/04/2017 1404   LDLCALC 141 (H) 10/21/2022 1050   Hepatic Function Panel     Component Value Date/Time   PROT 7.9 10/21/2022 1050   ALBUMIN 4.5 10/21/2022 1050   AST 24 10/21/2022 1050   ALT 20 10/21/2022 1050   ALKPHOS 74 10/21/2022 1050   BILITOT 0.5 10/21/2022 1050       Component Value Date/Time   TSH 1.350 10/21/2022 1050   Nutritional Lab Results  Component Value Date   VD25OH 50.8 10/21/2022   VD25OH 28 (L) 03/04/2017    Follow-Up   Return in about 4 weeks (around 11/16/2023) for Fasting and 30 minutes early for IC.Marland Kitchen She was informed of the importance of frequent follow up visits to maximize her success with intensive lifestyle modifications for her multiple health conditions.  Attestation Statement   Reviewed by clinician on day of visit: allergies, medications, problem list, medical history, surgical history, family history, social history, and previous encounter notes.     Worthy Rancher, MD

## 2023-11-02 DIAGNOSIS — M25511 Pain in right shoulder: Secondary | ICD-10-CM | POA: Diagnosis not present

## 2023-11-02 DIAGNOSIS — R600 Localized edema: Secondary | ICD-10-CM | POA: Diagnosis not present

## 2023-11-02 DIAGNOSIS — R7989 Other specified abnormal findings of blood chemistry: Secondary | ICD-10-CM | POA: Diagnosis not present

## 2023-11-02 DIAGNOSIS — R0683 Snoring: Secondary | ICD-10-CM | POA: Diagnosis not present

## 2023-11-02 DIAGNOSIS — I10 Essential (primary) hypertension: Secondary | ICD-10-CM | POA: Diagnosis not present

## 2023-11-02 DIAGNOSIS — E669 Obesity, unspecified: Secondary | ICD-10-CM | POA: Diagnosis not present

## 2023-11-02 DIAGNOSIS — E559 Vitamin D deficiency, unspecified: Secondary | ICD-10-CM | POA: Diagnosis not present

## 2023-11-02 DIAGNOSIS — M25562 Pain in left knee: Secondary | ICD-10-CM | POA: Diagnosis not present

## 2023-11-02 DIAGNOSIS — R7301 Impaired fasting glucose: Secondary | ICD-10-CM | POA: Diagnosis not present

## 2023-11-02 DIAGNOSIS — R944 Abnormal results of kidney function studies: Secondary | ICD-10-CM | POA: Diagnosis not present

## 2023-11-02 DIAGNOSIS — E782 Mixed hyperlipidemia: Secondary | ICD-10-CM | POA: Diagnosis not present

## 2023-11-02 DIAGNOSIS — J302 Other seasonal allergic rhinitis: Secondary | ICD-10-CM | POA: Diagnosis not present

## 2023-11-05 DIAGNOSIS — E782 Mixed hyperlipidemia: Secondary | ICD-10-CM | POA: Diagnosis not present

## 2023-11-05 DIAGNOSIS — E669 Obesity, unspecified: Secondary | ICD-10-CM | POA: Diagnosis not present

## 2023-11-05 LAB — BASIC METABOLIC PANEL WITH GFR
CO2: 27 — AB (ref 13–22)
Chloride: 101 (ref 99–108)
Creatinine: 0.9 (ref 0.5–1.1)
Glucose: 98
Potassium: 4.7 meq/L (ref 3.5–5.1)
Sodium: 139 (ref 137–147)

## 2023-11-05 LAB — CBC AND DIFFERENTIAL
HCT: 40 (ref 36–46)
Hemoglobin: 13.1 (ref 12.0–16.0)
Platelets: 241 10*3/uL (ref 150–400)
WBC: 4.4

## 2023-11-05 LAB — LIPID PANEL
Cholesterol: 146 (ref 0–200)
HDL: 43 (ref 35–70)
LDL Cholesterol: 84
Triglycerides: 101 (ref 40–160)

## 2023-11-05 LAB — COMPREHENSIVE METABOLIC PANEL WITH GFR
Albumin: 4.2 (ref 3.5–5.0)
Calcium: 9.7 (ref 8.7–10.7)
Globulin: 3
eGFR: 67

## 2023-11-05 LAB — CBC: RBC: 4.36 (ref 3.87–5.11)

## 2023-11-05 LAB — HEPATIC FUNCTION PANEL
ALT: 14 U/L (ref 7–35)
AST: 19 (ref 13–35)
Alkaline Phosphatase: 68 (ref 25–125)
Bilirubin, Total: 0.5

## 2023-11-12 ENCOUNTER — Telehealth: Payer: Self-pay

## 2023-11-12 NOTE — Progress Notes (Signed)
   11/12/2023  Patient ID: Yesenia Montgomery, female   DOB: 03/10/1956, 68 y.o.   MRN: [id number]   Patient appeared on insurance report for not passing the quality metrics in 2024:  Medication Adherence for Diabetes (MAD) Medication Adherence for Hypertension [facility]) Statin Use in Persons with Diabetes (SUPD)   Outreach to the patient was not needed today.  Meds Tracking:   -Losartan 100 mg - Last fill 90 DS on 10/11/23, BP 138/80 at PCP visit on 11/02/23  -Metformin ER 500 mg - discontinued by PCP on 11/02/23, was using for weight loss/prediabetes. Most recent A1C 5.5, well controlled.  -Rosuvastatin 5 mg - Last fill 90DS on 10/11/23, LDL 84 on 11/02/23  Plan:   Patient has not entered adherence metric yet with just one fill for losartan and rosuvastatin. Next fill due 01/09/24. Will review fill history/outreach on 01/13/24. Next PCP visit 03/02/24.  Fayette Pho, PharmD

## 2023-11-18 ENCOUNTER — Ambulatory Visit (INDEPENDENT_AMBULATORY_CARE_PROVIDER_SITE_OTHER): Payer: Medicare HMO | Admitting: Internal Medicine

## 2023-11-18 ENCOUNTER — Encounter (INDEPENDENT_AMBULATORY_CARE_PROVIDER_SITE_OTHER): Payer: Self-pay | Admitting: Internal Medicine

## 2023-11-18 VITALS — BP 162/87 | HR 67 | Temp 97.6°F | Ht 67.0 in | Wt 238.0 lb

## 2023-11-18 DIAGNOSIS — Z6837 Body mass index (BMI) 37.0-37.9, adult: Secondary | ICD-10-CM | POA: Diagnosis not present

## 2023-11-18 DIAGNOSIS — E669 Obesity, unspecified: Secondary | ICD-10-CM | POA: Diagnosis not present

## 2023-11-18 DIAGNOSIS — R7303 Prediabetes: Secondary | ICD-10-CM

## 2023-11-18 DIAGNOSIS — K760 Fatty (change of) liver, not elsewhere classified: Secondary | ICD-10-CM | POA: Diagnosis not present

## 2023-11-18 DIAGNOSIS — I10 Essential (primary) hypertension: Secondary | ICD-10-CM

## 2023-11-18 DIAGNOSIS — E66812 Obesity, class 2: Secondary | ICD-10-CM

## 2023-11-18 MED ORDER — TOPIRAMATE 25 MG PO TABS: 25.0000 mg | ORAL_TABLET | Freq: Every evening | ORAL | 0 refills | Status: DC

## 2023-11-18 MED ORDER — AMLODIPINE BESYLATE 5 MG PO TABS: 5.0000 mg | ORAL_TABLET | Freq: Every day | ORAL | 0 refills | Status: DC

## 2023-11-18 NOTE — Assessment & Plan Note (Addendum)
 Patient recently had blood work at Dr. Scharlene Gloss office I received copy but I do not see hemoglobin A1c her fasting blood sugar was normal I think we may need to obtain an A1c at the next visit.  She did not tolerate metformin in the past.  She will continue to work on maintaining a diet with a low glycemic load and increasing physical activity  Had been on Ozempic briefly but insurance denied coverage for prediabetes.

## 2023-11-18 NOTE — Assessment & Plan Note (Signed)
 Uncontrolled hypertension at 162/87 mmHg, likely exacerbated by supplements containing stimulants. A repeat measurement showed 157/93 mmHg.  Advised discontinuation due to potential stimulant effects not always disclosed on labels. - Advise discontinuation of supplements containing stimulants. - Refill amlodipine prescription for blood pressure management.

## 2023-11-18 NOTE — Assessment & Plan Note (Signed)
 Detected on ultrasound of the liver in October 2024.  I reviewed most recent labs from Dr. Scharlene Gloss office which showed normal liver enzymes, and platelet count.  Continue with current weight management strategy.

## 2023-11-18 NOTE — Assessment & Plan Note (Signed)
 Obesity with metabolic dysfunction associated with steatotic liver disease. She adheres to a reduced calorie nutrition plan, consuming whole foods, adequate protein, and maintaining hydration. Occasional meal skipping and lack of exercise due to lower extremity issues are noted. Her metabolic rate improved from 1123 to 1584 calories, indicating enhanced metabolism. The goal is a 500-calorie daily deficit to lose one pound per week. Topiramate was discussed as an appetite suppressant to aid in weight management by reducing hunger and increasing fullness. - Continue reduced calorie nutrition plan with a target of 1100 calories per day. - Encourage resumption of physical activity as tolerated. - Educate on using AI tools for calorie tracking. - Start topiramate for appetite suppression.

## 2023-11-18 NOTE — Progress Notes (Signed)
 Office: [phone]  /  Fax: [phone]  Weight Summary And Biometrics  Vitals Temp: 97.6 F (36.4 C) BP: (!) 162/87 Pulse Rate: 67 SpO2: 98 %   Anthropometric Measurements Height: 5\' 7"  (1.702 m) Weight: 238 lb (108 kg) BMI (Calculated): 37.27 Weight at Last Visit: 239 lb Weight Lost Since Last Visit: 1 lb Weight Gained Since Last Visit: 0 Starting Weight: 240 lb Total Weight Loss (lbs): 2 lb (0.907 kg) Peak Weight: 276 lb   Body Composition  Body Fat %: 50.5 % Fat Mass (lbs): 120.4 lbs Muscle Mass (lbs): 112 lbs Total Body Water (lbs): 86 lbs Visceral Fat Rating : 16    No data recorded Today's Visit #: 16  Starting Date: 10/21/22   Subjective   Chief Complaint: Obesity  Interval History Discussed the use of AI scribe software for clinical note transcription with the patient, who gave verbal consent to proceed.  History of Present Illness Yesenia Montgomery is a 68 year old female with obesity, metabolic dysfunction associated with steatotic liver disease, hypertension, and prediabetes who presents for medical weight management.  She is adhering to a reduced calorie nutrition plan, consuming more whole foods, adequate protein, and maintaining hydration, although she occasionally skips meals. She has not been exercising due to lower extremity problems but notes adequate sleep and denies high stress levels. Her weight has decreased by one pound since her last visit, fluctuating around 230 pounds, down from 240 pounds.  Her blood pressure is recorded at 162/87, which is uncontrolled. She takes various supplements, including Skinny Fit, soursop bitters, and black seed bitters, which she believes have curbed her appetite.    Her metabolic rate was measured at 7253 calories, an improvement from a previous measurement of 1123 calories. She is currently using My Fitness Pal to track her food intake and is considering using AI technology to estimate meal calories. She  is aiming for a calorie intake of 1100 to achieve a calorie deficit for weight loss.    Challenges affecting patient progress: low volume of physical activity at present , orthopedic problems, medical conditions or chronic pain affecting mobility, menopause, and use of CAM .    Pharmacotherapy for weight management: She is currently taking Topiramate (off label use, single agent) with adequate clinical response  and without side effects..   Assessment and Plan   Treatment Plan For Obesity:  Recommended Dietary Goals  Emmogene is currently in the action stage of change. As such, her goal is to continue weight management plan. She has agreed to: follow the Category 1 plan - 1000 kcal per day + 100  Behavioral Health and Counseling  We discussed the following behavioral modification strategies today: increasing lean protein intake to established goals, increasing vegetables, increasing fiber rich foods, avoiding skipping meals, increasing water intake , work on meal planning and preparation, and work on tracking and journaling calories using tracking application.  Additional education and resources provided today: Handout on principles of weight management  Recommended Physical Activity Goals  Saesha has been advised to work up to 150 minutes of moderate intensity aerobic activity a week and strengthening exercises 2-3 times per week for cardiovascular health, weight loss maintenance and preservation of muscle mass.   She has agreed to :  Think about enjoyable ways to increase daily physical activity and overcoming barriers to exercise and Increase physical activity in their day and reduce sedentary time (increase NEAT).  Pharmacotherapy  We discussed various medication options to help Akaisha with  her weight loss efforts and we both agreed to : adequate clinical response to current dose, continue current regimen, d/c supplements   Associated Conditions Impacted by Obesity  Treatment  Metabolic dysfunction-associated steatotic liver disease (MASLD) Assessment & Plan: Detected on ultrasound of the liver in October 2024.  I reviewed most recent labs from Dr. Scharlene Gloss office which showed normal liver enzymes, and platelet count.  Continue with current weight management strategy.     Essential hypertension Assessment & Plan: Uncontrolled hypertension at 162/87 mmHg, likely exacerbated by supplements containing stimulants. A repeat measurement showed 157/93 mmHg.  Advised discontinuation due to potential stimulant effects not always disclosed on labels. - Advise discontinuation of supplements containing stimulants. - Refill amlodipine prescription for blood pressure management.  Orders: -     amLODIPine Besylate; Take 1 tablet (5 mg total) by mouth daily.  Dispense: 90 tablet; Refill: 0  Obesity - (Start BMI 37.57 Assessment & Plan: Obesity with metabolic dysfunction associated with steatotic liver disease. She adheres to a reduced calorie nutrition plan, consuming whole foods, adequate protein, and maintaining hydration. Occasional meal skipping and lack of exercise due to lower extremity issues are noted. Her metabolic rate improved from 1123 to 1584 calories, indicating enhanced metabolism. The goal is a 500-calorie daily deficit to lose one pound per week. Topiramate was discussed as an appetite suppressant to aid in weight management by reducing hunger and increasing fullness. - Continue reduced calorie nutrition plan with a target of 1100 calories per day. - Encourage resumption of physical activity as tolerated. - Educate on using AI tools for calorie tracking. - Start topiramate for appetite suppression.  Orders: -     Topiramate; Take 1 tablet (25 mg total) by mouth every evening.  Dispense: 30 tablet; Refill: 0  Pre-diabetes Assessment & Plan: Patient recently had blood work at Dr. Scharlene Gloss office I received copy but I do not see hemoglobin A1c her  fasting blood sugar was normal I think we may need to obtain an A1c at the next visit.  She did not tolerate metformin in the past.  She will continue to work on maintaining a diet with a low glycemic load and increasing physical activity  Had been on Ozempic briefly but insurance denied coverage for prediabetes.      Objective   Physical Exam:  Blood pressure (!) 162/87, pulse 67, temperature 97.6 F (36.4 C), height 5\' 7"  (1.702 m), weight 238 lb (108 kg), SpO2 98%. Body mass index is 37.28 kg/m.  General: She is overweight, cooperative, alert, well developed, and in no acute distress. PSYCH: Has normal mood, affect and thought process.   HEENT: EOMI, sclerae are anicteric. Lungs: Normal breathing effort, no conversational dyspnea. Extremities: No edema.  Neurologic: No gross sensory or motor deficits. No tremors or fasciculations noted.    Diagnostic Data Reviewed:  BMET    Component Value Date/Time   NA 140 10/21/2022 1050   K 4.6 10/21/2022 1050   CL 100 10/21/2022 1050   CO2 26 10/21/2022 1050   GLUCOSE 97 10/21/2022 1050   GLUCOSE 110 (H) 06/04/2020 1435   BUN 16 10/21/2022 1050   CREATININE 0.97 10/21/2022 1050   CREATININE 0.90 03/04/2017 1404   CALCIUM 10.2 10/21/2022 1050   GFRNONAA >60 06/04/2020 1435   GFRNONAA 69 03/04/2017 1404   GFRAA >60 10/28/2017 0418   GFRAA 79 03/04/2017 1404   Lab Results  Component Value Date   HGBA1C 5.8 (H) 10/21/2022   HGBA1C 5.6 03/04/2017  Lab Results  Component Value Date   INSULIN 12.7 10/21/2022   Lab Results  Component Value Date   TSH 1.350 10/21/2022   CBC    Component Value Date/Time   WBC 5.9 06/04/2020 1435   RBC 4.46 06/04/2020 1435   HGB 13.1 06/04/2020 1435   HCT 41.6 06/04/2020 1435   PLT 269 06/04/2020 1435   MCV 93.3 06/04/2020 1435   MCH 29.4 06/04/2020 1435   MCHC 31.5 06/04/2020 1435   RDW 13.4 06/04/2020 1435   Iron Studies No results found for: "IRON", "TIBC", "FERRITIN",  "IRONPCTSAT" Lipid Panel     Component Value Date/Time   CHOL 216 (H) 10/21/2022 1050   TRIG 164 (H) 10/21/2022 1050   HDL 45 10/21/2022 1050   CHOLHDL 5.3 (H) 03/04/2017 1404   VLDL 46 (H) 03/04/2017 1404   LDLCALC 141 (H) 10/21/2022 1050   Hepatic Function Panel     Component Value Date/Time   PROT 7.9 10/21/2022 1050   ALBUMIN 4.5 10/21/2022 1050   AST 24 10/21/2022 1050   ALT 20 10/21/2022 1050   ALKPHOS 74 10/21/2022 1050   BILITOT 0.5 10/21/2022 1050      Component Value Date/Time   TSH 1.350 10/21/2022 1050   Nutritional Lab Results  Component Value Date   VD25OH 50.8 10/21/2022   VD25OH 28 (L) 03/04/2017    Medications: Outpatient Encounter Medications as of 11/18/2023  Medication Sig   cholecalciferol (VITAMIN D3) 25 MCG (1000 UNIT) tablet Take 1,000 Units by mouth daily.   Potassium 99 MG TABS Take 10 mg by mouth daily.   Rosuvastatin Calcium 5 MG CPSP Take by mouth.   [DISCONTINUED] amLODipine (NORVASC) 5 MG tablet Take 1 tablet (5 mg total) by mouth daily.   amLODipine (NORVASC) 5 MG tablet Take 1 tablet (5 mg total) by mouth daily.   topiramate (TOPAMAX) 25 MG tablet Take 1 tablet (25 mg total) by mouth every evening.   No facility-administered encounter medications on file as of 11/18/2023.     Follow-Up   Return in about 4 weeks (around 12/16/2023) for For Weight Mangement with Dr. Rikki Spearing.Marland Kitchen She was informed of the importance of frequent follow up visits to maximize her success with intensive lifestyle modifications for her multiple health conditions.  Attestation Statement   Reviewed by clinician on day of visit: allergies, medications, problem list, medical history, surgical history, family history, social history, and previous encounter notes.     Worthy Rancher, MD

## 2023-11-25 ENCOUNTER — Other Ambulatory Visit (INDEPENDENT_AMBULATORY_CARE_PROVIDER_SITE_OTHER): Payer: Self-pay | Admitting: *Deleted

## 2023-12-29 ENCOUNTER — Ambulatory Visit (INDEPENDENT_AMBULATORY_CARE_PROVIDER_SITE_OTHER): Admitting: Internal Medicine

## 2023-12-29 ENCOUNTER — Encounter (INDEPENDENT_AMBULATORY_CARE_PROVIDER_SITE_OTHER): Payer: Self-pay | Admitting: Internal Medicine

## 2023-12-29 VITALS — BP 142/90 | HR 68 | Temp 98.3°F | Ht 67.0 in | Wt 236.0 lb

## 2023-12-29 DIAGNOSIS — Z5189 Encounter for other specified aftercare: Secondary | ICD-10-CM | POA: Insufficient documentation

## 2023-12-29 DIAGNOSIS — E669 Obesity, unspecified: Secondary | ICD-10-CM

## 2023-12-29 DIAGNOSIS — Z9189 Other specified personal risk factors, not elsewhere classified: Secondary | ICD-10-CM | POA: Diagnosis not present

## 2023-12-29 DIAGNOSIS — K760 Fatty (change of) liver, not elsewhere classified: Secondary | ICD-10-CM

## 2023-12-29 DIAGNOSIS — I10 Essential (primary) hypertension: Secondary | ICD-10-CM

## 2023-12-29 DIAGNOSIS — R7303 Prediabetes: Secondary | ICD-10-CM

## 2023-12-29 DIAGNOSIS — Z6836 Body mass index (BMI) 36.0-36.9, adult: Secondary | ICD-10-CM | POA: Diagnosis not present

## 2023-12-29 MED ORDER — TOPIRAMATE 25 MG PO TABS: 25.0000 mg | ORAL_TABLET | Freq: Every evening | ORAL | 0 refills | Status: DC

## 2023-12-29 NOTE — Assessment & Plan Note (Signed)
 Patient counseled on the risk associated with CAM, she is not happy with her weight loss rate and continues to try some supplements with claims for metabolic health.  We cautioned her on the risk associated with some of the supplements.  The one containing chromium and green tea extract may be raising her blood pressure.

## 2023-12-29 NOTE — Assessment & Plan Note (Signed)
 She had blood work done at her PCPs office which I am unable to retrieve.  We will request records.  She does not have coverage for GLP-1 and did not tolerate metformin  for pharmacoprophylaxis.  She will continue with nutritional behavioral strategies.

## 2023-12-29 NOTE — Assessment & Plan Note (Signed)
 Detected on ultrasound of the liver in October 2024.  We will request records from Dr. Quentin Brunner office to review most recent CBC and liver enzymes.  Continue with current weight management strategy

## 2023-12-29 NOTE — Assessment & Plan Note (Signed)
 Her blood pressure is again elevated she has not been taking a supplement with chromium and green tea which could be stimulating.  We again cautioned her against the use of stimulating supplements but she would like to find an alternative to pharmacotherapy for weight management. -Continue amlodipine  at the current dose -Discontinue use of supplements -Monitor blood pressure in the morning and also before bedtime over the next 7 days -Contact primary care team if blood pressure is above 130/80.

## 2023-12-29 NOTE — Assessment & Plan Note (Signed)
 Patient has multiple contributing factors including a slow metabolic rate and menopause.  She continues to work on increasing volume of physical activity with good adherence to reduced calorie nutrition plan.  She is currently on topiramate  for appetite suppression without any adverse effects.  She will continue with current weight management strategy

## 2023-12-29 NOTE — Progress Notes (Signed)
 Office: [phone]  /  Fax: [phone]  Weight Summary And Biometrics  Vitals Temp: 98.3 F (36.8 C) BP: (!) 142/90 Pulse Rate: 68 SpO2: 97 %   Anthropometric Measurements Height: 5\' 7"  (1.702 m) Weight: 236 lb (107 kg) BMI (Calculated): 36.95 Weight at Last Visit: 238 lb Weight Lost Since Last Visit: 2 lb Weight Gained Since Last Visit: 0 lb Starting Weight: 240 lb Total Weight Loss (lbs): 4 lb (1.814 kg) Peak Weight: 276 lb   Body Composition  Body Fat %: 47.1 % Fat Mass (lbs): 111.4 lbs Muscle Mass (lbs): 118.6 lbs Total Body Water (lbs): 97.2 lbs Visceral Fat Rating : 15    RMR: 1584  Today's Visit #: 17  Starting Date: 10/21/22   Subjective   Chief Complaint: Obesity  Interval History Discussed the use of AI scribe software for clinical note transcription with the patient, who gave verbal consent to proceed.  History of Present Illness   Yesenia Montgomery is a 68 year old female who presents for medical weight management.  She has lost two pounds since her last office visit. She adheres to a category one meal plan 70-80% of the time, tracks her caloric intake, consumes more whole foods, meets recommended protein intake, maintains hydration, and does not skip meals. She exercises two to three days a week for 15-20 minutes, including cardio and yoga.  Her weight fluctuates daily, with variations sometimes resulting in a gain or loss of a few pounds overnight. Despite these fluctuations, she notes progress in body composition, with a decrease in clothing size from a size 20 to a junior Mrs. size 14-16, indicating a loss of inches rather than pounds.  She takes sea moss supplements and another supplement that she believes enhances her energy levels and metabolism. These supplements reduce her appetite, necessitating an effort to eat in the morning. She also uses a juicer and consumes protein shakes. She has been taking these supplements for about two months  and notices a significant difference in her energy levels and body composition.  Her blood pressure was previously 140/72 or 73 before starting a new supplement, and it currently reads 149/90. She takes topiramate  in the evening, which she believes helps with her appetite, as she often wakes up without hunger. Her eating schedule typically begins after 12 PM, with meals set three times a day. She sometimes skips the midday meal and opts for a larger portion of vegetables later in the day.       Challenges affecting patient progress: slow metabolism for age and menopause.    Pharmacotherapy for weight management: She is currently taking Topiramate  (off label use, single agent) with adequate clinical response  and without side effects..   Assessment and Plan   Treatment Plan For Obesity:  Recommended Dietary Goals  Yesenia Montgomery is currently in the action stage of change. As such, her goal is to continue weight management plan. She has agreed to: continue current plan  Behavioral Health and Counseling  We discussed the following behavioral modification strategies today:  Counseled on safety concerns regarding complementary alternative medicine .  Additional education and resources provided today: None  Recommended Physical Activity Goals  Yesenia Montgomery has been advised to work up to 150 minutes of moderate intensity aerobic activity a week and strengthening exercises 2-3 times per week for cardiovascular health, weight loss maintenance and preservation of muscle mass.   She has agreed to :  continue to gradually increase the amount and intensity of exercise routine  Pharmacotherapy  We discussed various medication options to help Yesenia Montgomery with her weight loss efforts and we both agreed to : adequate clinical response to anti-obesity medication, continue current regimen  Associated Conditions Impacted by Obesity Treatment  Complementary medicine Assessment & Plan: Patient counseled on the risk  associated with CAM, she is not happy with her weight loss rate and continues to try some supplements with claims for metabolic health.  We cautioned her on the risk associated with some of the supplements.  The one containing chromium and green tea extract may be raising her blood pressure.   Obesity - (Start BMI 37.57 Assessment & Plan: Patient has multiple contributing factors including a slow metabolic rate and menopause.  She continues to work on increasing volume of physical activity with good adherence to reduced calorie nutrition plan.  She is currently on topiramate  for appetite suppression without any adverse effects.  She will continue with current weight management strategy  Orders: -     Topiramate ; Take 1 tablet (25 mg total) by mouth every evening.  Dispense: 60 tablet; Refill: 0  Pre-diabetes Assessment & Plan: She had blood work done at her PCPs office which I am unable to retrieve.  We will request records.  She does not have coverage for GLP-1 and did not tolerate metformin  for pharmacoprophylaxis.  She will continue with nutritional behavioral strategies.   Essential hypertension Assessment & Plan: Her blood pressure is again elevated she has not been taking a supplement with chromium and green tea which could be stimulating.  We again cautioned her against the use of stimulating supplements but she would like to find an alternative to pharmacotherapy for weight management. -Continue amlodipine  at the current dose -Discontinue use of supplements -Monitor blood pressure in the morning and also before bedtime over the next 7 days -Contact primary care team if blood pressure is above 130/80.   Metabolic dysfunction-associated steatotic liver disease (MASLD) Assessment & Plan: Detected on ultrasound of the liver in October 2024.  We will request records from Dr. Quentin Brunner office to review most recent CBC and liver enzymes.  Continue with current weight management  strategy            Objective   Physical Exam:  Blood pressure (!) 142/90, pulse 68, temperature 98.3 F (36.8 C), height 5\' 7"  (1.702 m), weight 236 lb (107 kg), SpO2 97%. Body mass index is 36.96 kg/m.  General: She is overweight, cooperative, alert, well developed, and in no acute distress. PSYCH: Has normal mood, affect and thought process.   HEENT: EOMI, sclerae are anicteric. Lungs: Normal breathing effort, no conversational dyspnea. Extremities: No edema.  Neurologic: No gross sensory or motor deficits. No tremors or fasciculations noted.    Diagnostic Data Reviewed:  BMET    Component Value Date/Time   NA 139 11/05/2023 0000   K 4.7 11/05/2023 0000   CL 101 11/05/2023 0000   CO2 27 (A) 11/05/2023 0000   GLUCOSE 97 10/21/2022 1050   GLUCOSE 110 (H) 06/04/2020 1435   BUN 16 10/21/2022 1050   CREATININE 0.9 11/05/2023 0000   CREATININE 0.97 10/21/2022 1050   CREATININE 0.90 03/04/2017 1404   CALCIUM 9.7 11/05/2023 0000   GFRNONAA >60 06/04/2020 1435   GFRNONAA 69 03/04/2017 1404   GFRAA >60 10/28/2017 0418   GFRAA 79 03/04/2017 1404   Lab Results  Component Value Date   HGBA1C 5.8 (H) 10/21/2022   HGBA1C 5.6 03/04/2017   Lab Results  Component Value Date  INSULIN  12.7 10/21/2022   Lab Results  Component Value Date   TSH 1.350 10/21/2022   CBC    Component Value Date/Time   WBC 4.4 11/05/2023 0000   WBC 5.9 06/04/2020 1435   RBC 4.36 11/05/2023 0000   HGB 13.1 11/05/2023 0000   HCT 40 11/05/2023 0000   PLT 241 11/05/2023 0000   MCV 93.3 06/04/2020 1435   MCH 29.4 06/04/2020 1435   MCHC 31.5 06/04/2020 1435   RDW 13.4 06/04/2020 1435   Iron Studies No results found for: "IRON", "TIBC", "FERRITIN", "IRONPCTSAT" Lipid Panel     Component Value Date/Time   CHOL 146 11/05/2023 0000   CHOL 216 (H) 10/21/2022 1050   TRIG 101 11/05/2023 0000   HDL 43 11/05/2023 0000   HDL 45 10/21/2022 1050   CHOLHDL 5.3 (H) 03/04/2017 1404   VLDL 46  (H) 03/04/2017 1404   LDLCALC 84 11/05/2023 0000   LDLCALC 141 (H) 10/21/2022 1050   Hepatic Function Panel     Component Value Date/Time   PROT 7.9 10/21/2022 1050   ALBUMIN 4.2 11/05/2023 0000   ALBUMIN 4.5 10/21/2022 1050   AST 19 11/05/2023 0000   ALT 14 11/05/2023 0000   ALKPHOS 68 11/05/2023 0000   BILITOT 0.5 10/21/2022 1050      Component Value Date/Time   TSH 1.350 10/21/2022 1050   Nutritional Lab Results  Component Value Date   VD25OH 50.8 10/21/2022   VD25OH 28 (L) 03/04/2017    Medications: Outpatient Encounter Medications as of 12/29/2023  Medication Sig   amLODipine  (NORVASC ) 5 MG tablet Take 1 tablet (5 mg total) by mouth daily.   cholecalciferol (VITAMIN D3) 25 MCG (1000 UNIT) tablet Take 1,000 Units by mouth daily.   Potassium 99 MG TABS Take 10 mg by mouth daily.   Rosuvastatin Calcium 5 MG CPSP Take by mouth.   [DISCONTINUED] topiramate  (TOPAMAX ) 25 MG tablet Take 1 tablet (25 mg total) by mouth every evening.   topiramate  (TOPAMAX ) 25 MG tablet Take 1 tablet (25 mg total) by mouth every evening.   No facility-administered encounter medications on file as of 12/29/2023.     Follow-Up   Return in about 6 weeks (around 02/09/2024) for For Weight Mangement with Dr. Allie Area.Aaron Aas She was informed of the importance of frequent follow up visits to maximize her success with intensive lifestyle modifications for her multiple health conditions.  Attestation Statement   Reviewed by clinician on day of visit: allergies, medications, problem list, medical history, surgical history, family history, social history, and previous encounter notes.     Ladd Picker, MD

## 2024-01-14 ENCOUNTER — Telehealth: Payer: Self-pay

## 2024-01-14 NOTE — Progress Notes (Signed)
   01/14/2024  Patient ID: Yesenia Montgomery, female   DOB: 01/18/1956, 68 y.o.   MRN: [id number]  Adherence Monitoring  Patient overdue on meds, called to encourage refill. Documentation in [employer].   Flint Hummer, PharmD

## 2024-02-08 ENCOUNTER — Telehealth: Payer: Self-pay

## 2024-02-08 NOTE — Telephone Encounter (Signed)
 Adherence Monitoring:  Up to date on meds, next review in September

## 2024-02-19 IMAGING — MG MM DIGITAL SCREENING BILAT W/ TOMO AND CAD
8 series · 8 of 24 positions shown · non-contrast
Comparison: Previous exam(s).

CLINICAL DATA: Screening.

EXAM:
DIGITAL SCREENING BILATERAL MAMMOGRAM WITH TOMOSYNTHESIS AND CAD
TECHNIQUE: Bilateral screening digital craniocaudal and mediolateral oblique
mammograms were obtained. Bilateral screening digital breast
tomosynthesis was performed. The images were evaluated with
computer-aided detection.

[L CC synth-2D]
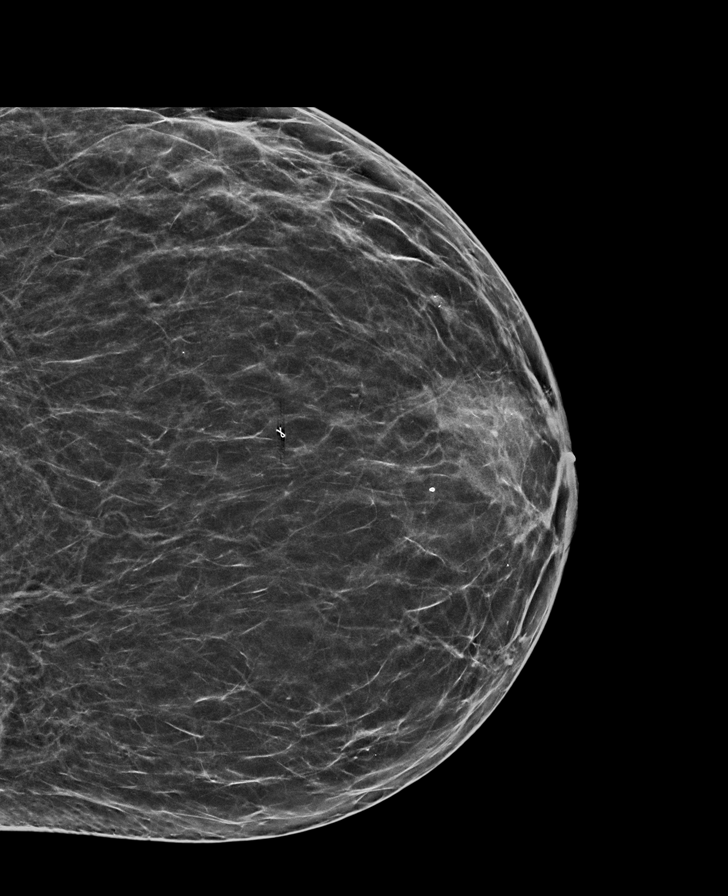

[R MLO synth-2D]
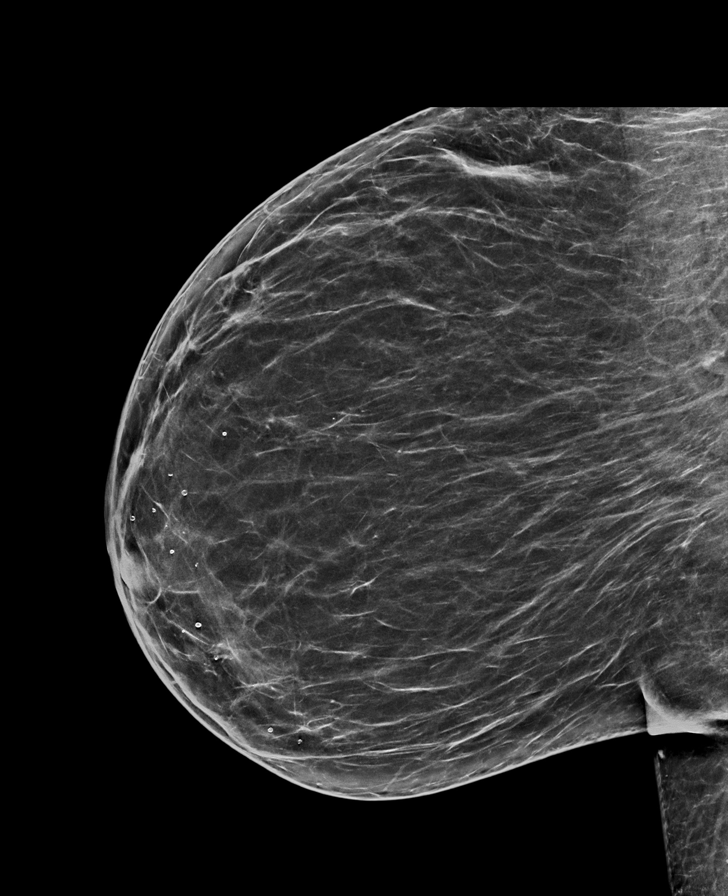

[R CC synth-2D]
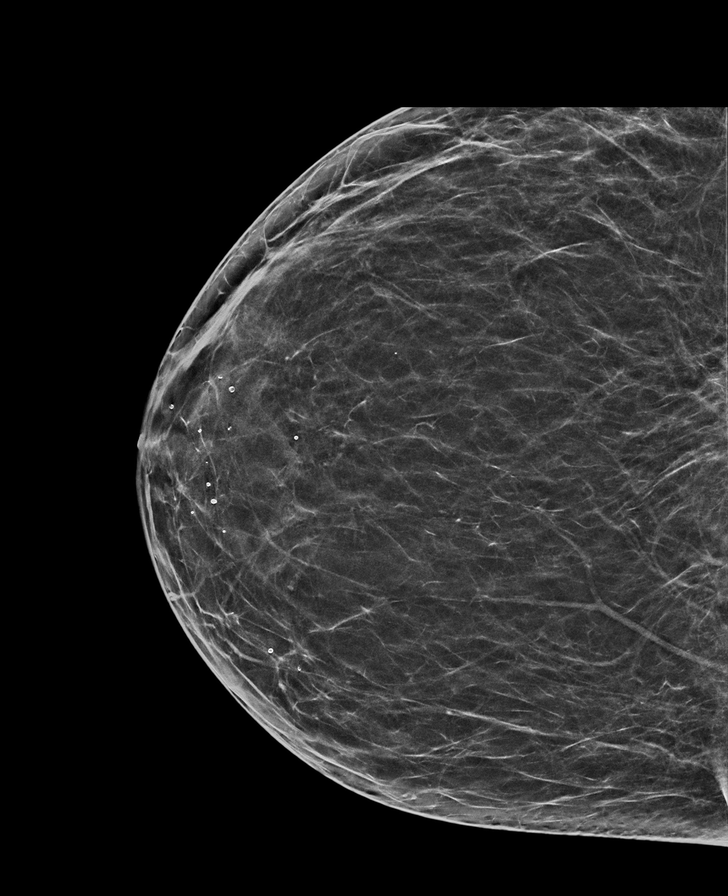

[L MLO synth-2D]
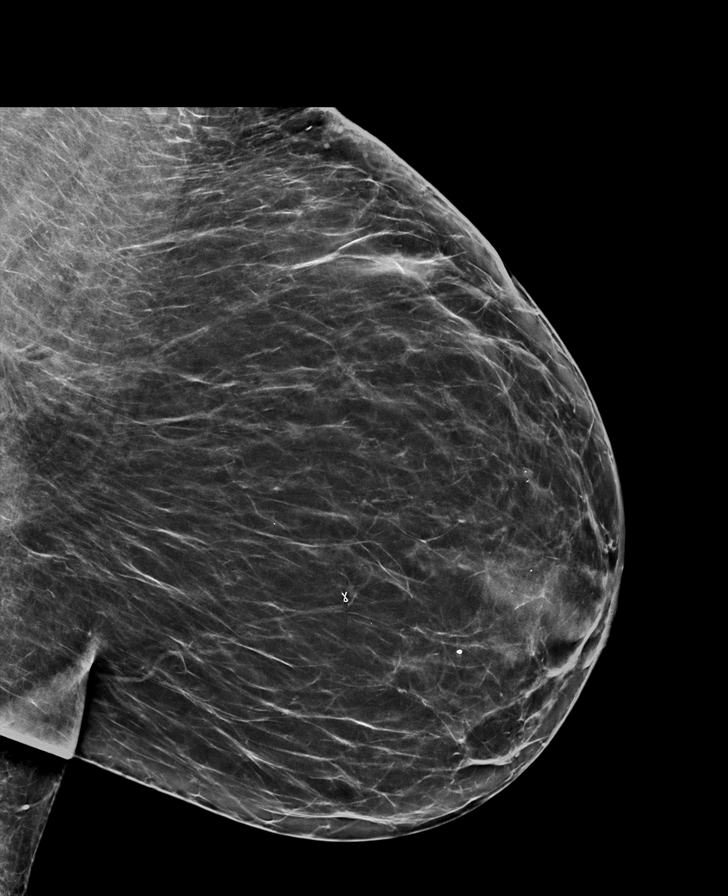

[L CC tomo · tomo slice 31/60.0]
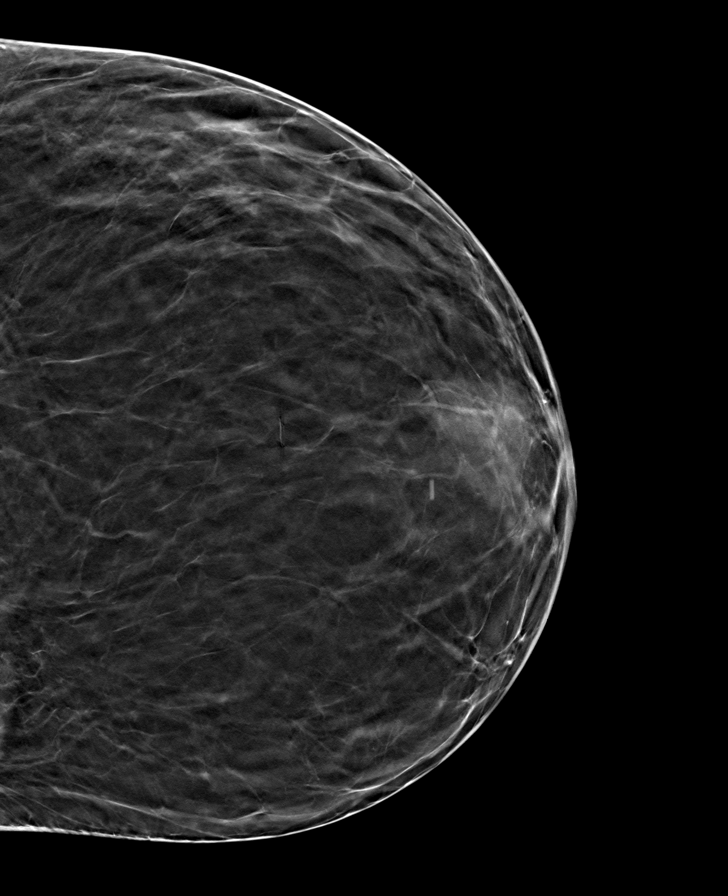

[R CC tomo · tomo slice 31/61.0]
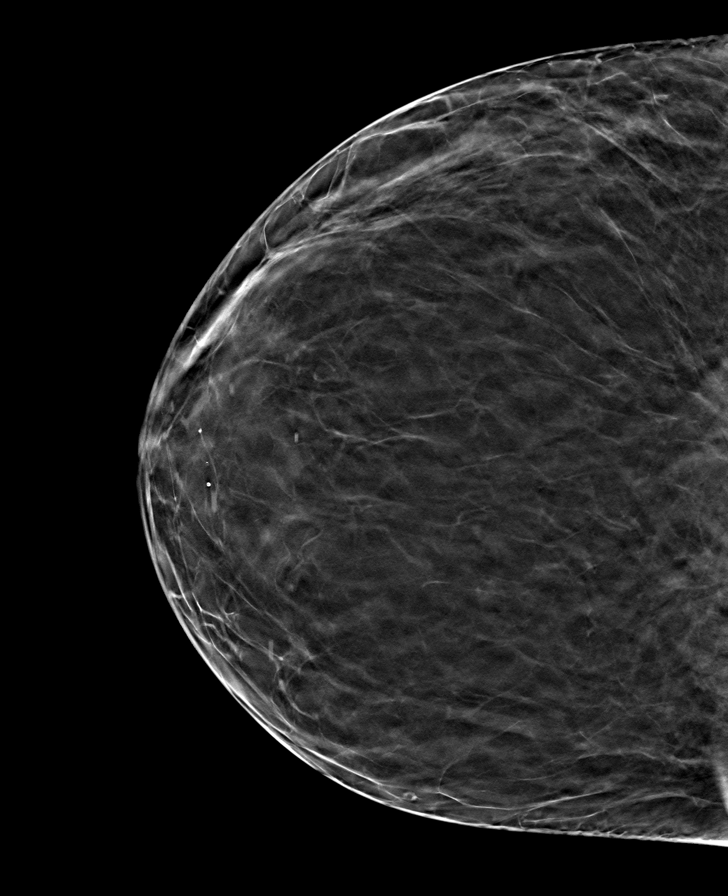

[L MLO tomo · tomo slice 37/74.0]
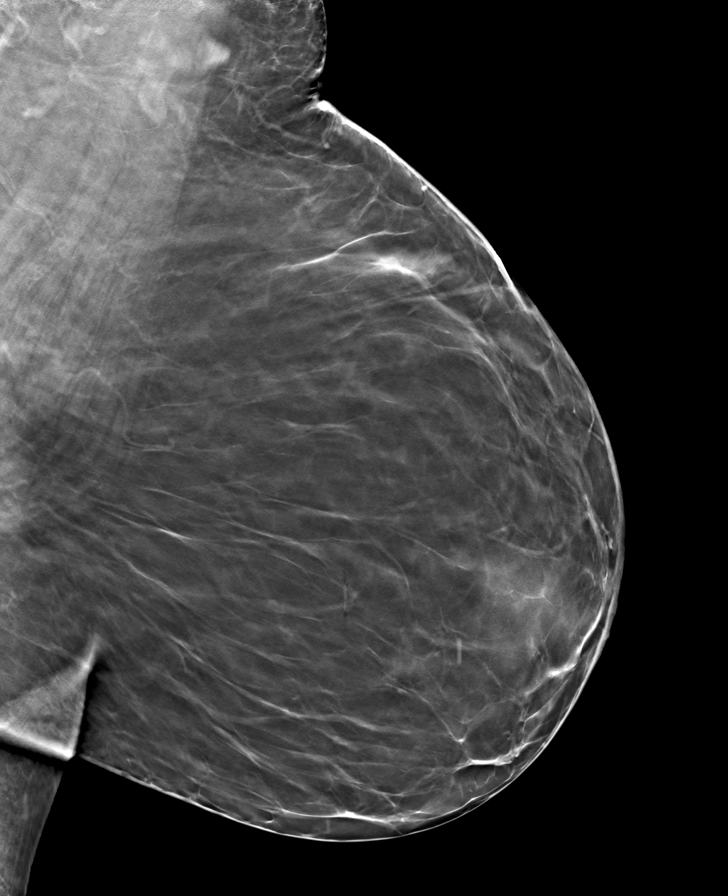

[R MLO tomo · tomo slice 35/70.0]
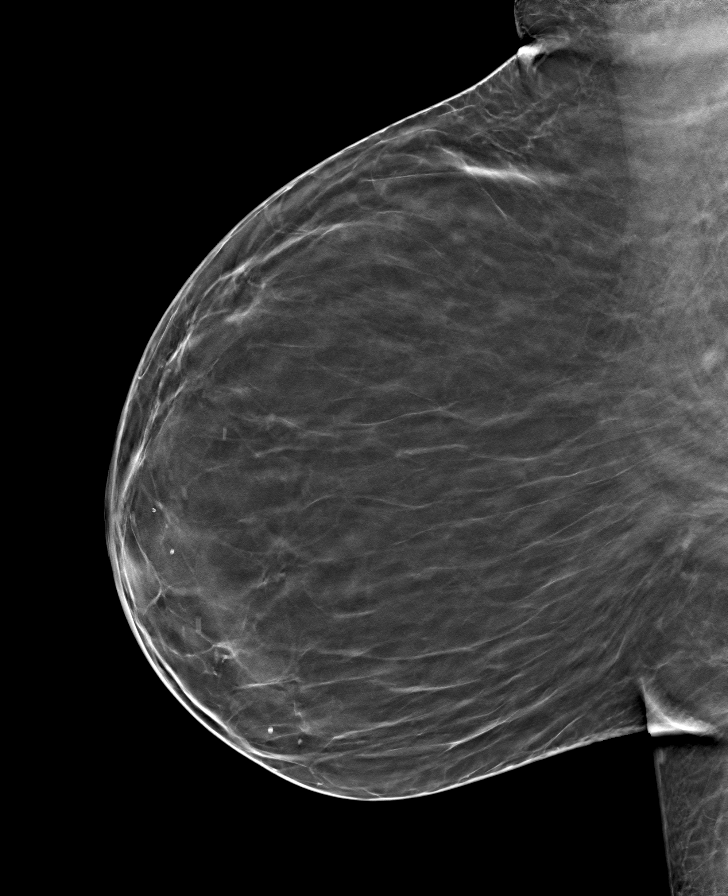

[8 of 24 positions shown; findings below may reference images not displayed]

ACR Breast Density Category b: There are scattered areas of
fibroglandular density.
FINDINGS: There are no findings suspicious for malignancy.
IMPRESSION: No mammographic evidence of malignancy. A result letter of this
screening mammogram will be mailed directly to the patient.

RECOMMENDATION:
Screening mammogram in one year. (Code:51-O-LD2)

BI-RADS CATEGORY  1: Negative.

## 2024-02-23 ENCOUNTER — Encounter (INDEPENDENT_AMBULATORY_CARE_PROVIDER_SITE_OTHER): Payer: Self-pay | Admitting: Internal Medicine

## 2024-02-23 ENCOUNTER — Ambulatory Visit (INDEPENDENT_AMBULATORY_CARE_PROVIDER_SITE_OTHER): Admitting: Internal Medicine

## 2024-02-23 VITALS — BP 150/94 | HR 78 | Temp 98.2°F | Ht 67.0 in | Wt 231.0 lb

## 2024-02-23 DIAGNOSIS — E669 Obesity, unspecified: Secondary | ICD-10-CM | POA: Diagnosis not present

## 2024-02-23 DIAGNOSIS — I10 Essential (primary) hypertension: Secondary | ICD-10-CM | POA: Diagnosis not present

## 2024-02-23 DIAGNOSIS — Z6836 Body mass index (BMI) 36.0-36.9, adult: Secondary | ICD-10-CM

## 2024-02-23 DIAGNOSIS — R7303 Prediabetes: Secondary | ICD-10-CM | POA: Diagnosis not present

## 2024-02-23 DIAGNOSIS — K760 Fatty (change of) liver, not elsewhere classified: Secondary | ICD-10-CM | POA: Diagnosis not present

## 2024-02-23 MED ORDER — TOPIRAMATE 25 MG PO TABS: 25.0000 mg | ORAL_TABLET | Freq: Every evening | ORAL | 0 refills | Status: DC

## 2024-02-23 MED ORDER — AMLODIPINE BESYLATE 5 MG PO TABS: 5.0000 mg | ORAL_TABLET | Freq: Every day | ORAL | 0 refills | Status: AC

## 2024-02-23 MED ORDER — TELMISARTAN 20 MG PO TABS: 20.0000 mg | ORAL_TABLET | Freq: Every day | ORAL | 0 refills | Status: DC

## 2024-02-23 NOTE — Assessment & Plan Note (Signed)
 She had blood work done at her PCPs office in March which I am unable to retrieve.  Records were requested but A1c still missing.  She has reduce simple and added sugars in her diet and has been consuming more complex carbs.  Her physical activity levels have gone down because of her knee arthritis.  She did not tolerate metformin  for diabetes prevention.  She would benefit from GLP-1 treatment as she is high risk for progression.

## 2024-02-23 NOTE — Assessment & Plan Note (Signed)
 Patient has been coming to our program since February of last year.  She has only been able to lose 9 pounds but there is been improvements in body composition.  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  She has been on topiramate  since January of this year as she has contraindications to sympathomimetics.  She had been on Mounjaro  in the past and had experienced some weight loss but her insurance stopped covering it.  I feel that she would benefit from treatment with [facility] as this will help her with her obesity and related conditions including hypertension, prediabetes, NAFLD, hypercholesterolemia, advanced osteoarthritis of the knees and will improve her quality of life and reduce cardiometabolic risk.  We will prescribe Wegovy 0.25 mg once a week.

## 2024-02-23 NOTE — Assessment & Plan Note (Addendum)
 Reviewed home blood pressure monitoring and again blood pressure today is elevated.  She is no longer taking over-the-counter supplements considered to be stimulants since last time we discussed this.  We again reviewed risk of endorgan damage and how conservative care preferences may result in health disparities.  She does not like to take medications.  She is agreeable to starting telmisartan 20 mg once a day.  She will continue with amlodipine  at current dose.  We will consider adding hydrochlorothiazide  to telmisartan if her blood pressure still not at goal.  She will continue to monitor at home.  She will avoid the use of over-the-counter supplements for dieting.  Treatment with GLP-1 may also help with blood pressure control.

## 2024-02-23 NOTE — Assessment & Plan Note (Signed)
 She has advanced osteoarthritis of both knees likely due to biomechanical complications associated with her obesity which is affecting her mobility, ability to exercise and quality of life.  She would benefit from joint replacement surgery at some point but losing weight may reduce the impact on her joint symptoms.  She would be a good candidate for GLP-1 assisted weight management.  Patient will be prescribed Wegovy 0.25 mg once a week

## 2024-02-23 NOTE — Progress Notes (Signed)
 Office: [phone]  /  Fax: [phone]  Yesenia Summary and Body Composition Analysis (BIA)  Vitals Temp: 98.2 F (36.8 C) BP: (!) 150/94 Pulse Rate: 78 SpO2: 95 %   Anthropometric Measurements Height: 5' 7 (1.702 m) Yesenia: 231 lb (104.8 kg) BMI (Calculated): 36.17 Yesenia at Last Visit: 236 lb Yesenia Lost Since Last Visit: 5 lb Yesenia Gained Since Last Visit: 0 lb Starting Yesenia: 240 lb Total Yesenia Loss (lbs): 9 lb (4.082 kg) Peak Yesenia: 276 lb   Body Composition  Body Fat %: 47 % [name] (lbs): 108.8 lbs [name] (lbs): 116.6 lbs Total Body Water (lbs): 92 lbs Visceral Fat Rating : 15    RMR: 1584  Today's Visit #: 18  Starting Date: 10/21/22   Subjective   Chief Complaint: Obesity  Interval History Discussed the use of AI scribe software for clinical note transcription with the patient, who gave verbal consent to proceed.  History of Present Illness   Yesenia Montgomery is a 68 year old female with obesity and knee osteoarthritis who presents for medical Yesenia management.  She has lost five pounds since her last visit and adheres to a 1000 calorie diet, focusing on whole foods, adequate protein intake, and hydration. She does not skip meals but is not currently tracking calories. Despite minimal Yesenia Montgomery, she notes a significant reduction in clothing size from 22 to 14-16, which she attributes to dietary changes, including increased intake of fresh vegetables, protein shakes, and smoothies. She practices intermittent fasting, having her first meal at noon, followed by a protein drink in the afternoon, and a light dinner.  She has not engaged in physical exercise for eight months due to worsening chronic pain from arthritis in both knees, with the left knee being more affected. Previously, high-intensity workouts exacerbated her knee pain. Cold and rainy weather has also contributed to her decreased physical activity.  She has a history of  prediabetes, hypertension, and intolerance to metformin . She is currently taking amlodipine  daily for blood pressure management. Her home blood pressure reading is 143/73, but recent office readings have been higher.  Her husband has adopted similar dietary habits and has experienced Yesenia loss. She feels encouraged by the noticeable changes in her body and the positive feedback from others.       Challenges affecting patient progress: low volume of physical activity at present , orthopedic problems, medical conditions or chronic pain affecting mobility, slow metabolism for age, and menopause.    Pharmacotherapy for Yesenia management: She is currently taking Topiramate  (off label use, single agent) with adequate clinical response  and without side effects..   Assessment and Plan   Treatment Plan For Obesity:  Recommended Dietary Goals  Yesenia Montgomery. As such, her goal is to continue Yesenia management plan. She has agreed to: continue current plan  Behavioral Montgomery and Counseling  We discussed the following behavioral modification strategies today: continue to work on maintaining a reduced calorie state, getting the recommended amount of protein, incorporating whole foods, making healthy choices, staying well hydrated and practicing mindfulness when eating..  Additional education and resources provided today: None  Recommended Physical Activity Goals  Yesenia Montgomery, Yesenia Montgomery.   She has agreed to :  Think about enjoyable ways to increase daily  physical activity and overcoming barriers to exercise and Increase physical activity in their day and reduce sedentary time (increase NEAT).  Medical Interventions and Pharmacotherapy  We discussed various  medication options to help Yesenia Montgomery with her Yesenia loss efforts and we both agreed to : Start anti-obesity medication.  In addition to reduced calorie nutrition plan (RCNP), behavioral strategies and physical activity, Yesenia Montgomery from pharmacotherapy to assist with hunger signals, satiety and cravings. This will reduce obesity-related Montgomery risks by inducing Yesenia loss, and help reduce food consumption and adherence to [facility]) . It may also improve QOL by improving self-confidence and reduce the  setbacks associated with metabolic adaptations. She has been on topiramate  since January of this year as she has contraindications to sympathomimetics.  She did not tolerate metformin  for pharmacoprophylaxis and diabetes prevention.  She had been on Mounjaro  in the past and had experienced some Yesenia loss but her insurance stopped covering it.  I feel that she would Montgomery from treatment with [facility] as this will help her with her obesity and related conditions including hypertension, prediabetes, NAFLD, hypercholesterolemia, advanced osteoarthritis of the knees and will improve her quality of life and reduce cardiometabolic risk.  We will prescribe Wegovy 0.25 mg once a week.  After discussion of treatment options, mechanisms of action, benefits, side effects, contraindications and shared decision making she is agreeable to starting Wegovy 0.25 mg once a week. Patient also made aware that medication is indicated for long-term management of obesity and the risk of Yesenia regain following discontinuation of treatment and hence the importance of adhering to medical Yesenia loss plan.  We demonstrated use of device and patient using teach back method was able to demonstrate proper technique.  Associated Conditions Impacted by Obesity Treatment  Metabolic dysfunction-associated steatotic liver disease (MASLD) Assessment & Plan: Detected on ultrasound of the liver in October 2024.  We will request records from  Dr. Milford office to review most recent CBC and liver enzymes.  Continue with current Yesenia management strategy     Obesity - (Start BMI 37.57 Assessment & Plan: Patient has been coming to our program since February of last year.  She has only been able to lose 9 pounds but there is been improvements in body composition.  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  She has been on topiramate  since January of this year as she has contraindications to sympathomimetics.  She had been on Mounjaro  in the past and had experienced some Yesenia loss but her insurance stopped covering it.  I feel that she would Montgomery from treatment with [facility] as this will help her with her obesity and related conditions including hypertension, prediabetes, NAFLD, hypercholesterolemia, advanced osteoarthritis of the knees and will improve her quality of life and reduce cardiometabolic risk.  We will prescribe Wegovy 0.25 mg once a week.  Orders: -     Topiramate ; Take 1 tablet (25 mg total) by mouth every evening.  Dispense: 60 tablet; Refill: 0  Essential hypertension Assessment & Plan: Reviewed home blood pressure monitoring and again blood pressure today is elevated.  She is no longer taking over-the-counter supplements considered to be stimulants since last time we discussed this.  We again reviewed risk of endorgan damage and how conservative care preferences may result in Montgomery disparities.  She does not like to take medications.  She is agreeable to starting telmisartan 20 mg once a day.  She will continue with amlodipine  at current dose.  We will consider adding hydrochlorothiazide  to telmisartan if her blood pressure still not at goal.  She will continue to monitor at home.  She will avoid the use of over-the-counter supplements for dieting.  Treatment with GLP-1 may also help with blood pressure control.  Orders: -     amLODIPine  Besylate; Take 1 tablet (5 mg  total) by mouth daily.  Dispense: 90 tablet; Refill: 0 -     Telmisartan; Take 1 tablet (20 mg total) by mouth daily.  Dispense: 30 tablet; Refill: 0  Pre-diabetes Assessment & Plan: She had blood work done at her PCPs office in March which I am unable to retrieve.  Records were requested but A1c still missing.  She has reduce simple and added sugars in her diet and has been consuming more complex carbs.  Her physical activity levels have gone down because of her knee arthritis.  She did not tolerate metformin  for diabetes prevention.  She would Montgomery from GLP-1 treatment as she is high risk for progression.            Objective   Physical Exam:  Blood pressure (!) 150/94, pulse 78, temperature 98.2 F (36.8 C), height 5' 7 (1.702 m), Yesenia 231 lb (104.8 kg), SpO2 95%. Body Montgomery index is 36.18 kg/m.  General: She is overweight, cooperative, alert, well developed, and in no acute distress. PSYCH: Has normal mood, affect and thought process.   HEENT: EOMI, sclerae are anicteric. Lungs: Normal breathing effort, no conversational dyspnea. Extremities: No edema.  Neurologic: No gross sensory or motor deficits. No tremors or fasciculations noted.    Diagnostic Data Reviewed:  BMET    Component Value Date/Time   NA 139 11/05/2023 0000   K 4.7 11/05/2023 0000   CL 101 11/05/2023 0000   CO2 27 (A) 11/05/2023 0000   GLUCOSE 97 10/21/2022 1050   GLUCOSE 110 (H) 06/04/2020 1435   BUN 16 10/21/2022 1050   CREATININE 0.9 11/05/2023 0000   CREATININE 0.97 10/21/2022 1050   CREATININE 0.90 03/04/2017 1404   CALCIUM 9.7 11/05/2023 0000   GFRNONAA >60 06/04/2020 1435   GFRNONAA 69 03/04/2017 1404   GFRAA >60 10/28/2017 0418   GFRAA 79 03/04/2017 1404   Lab Results  Component Value Date   HGBA1C 5.8 (H) 10/21/2022   HGBA1C 5.6 03/04/2017   Lab Results  Component Value Date   INSULIN  12.7 10/21/2022   Lab Results  Component Value Date   TSH 1.350 10/21/2022   CBC     Component Value Date/Time   WBC 4.4 11/05/2023 0000   WBC 5.9 06/04/2020 1435   RBC 4.36 11/05/2023 0000   HGB 13.1 11/05/2023 0000   HCT 40 11/05/2023 0000   PLT 241 11/05/2023 0000   MCV 93.3 06/04/2020 1435   MCH 29.4 06/04/2020 1435   MCHC 31.5 06/04/2020 1435   RDW 13.4 06/04/2020 1435   Iron Studies No results found for: IRON, TIBC, FERRITIN, IRONPCTSAT Lipid Panel     Component Value Date/Time   CHOL 146 11/05/2023 0000   CHOL 216 (H) 10/21/2022 1050   TRIG 101 11/05/2023 0000   HDL 43 11/05/2023 0000   HDL 45 10/21/2022 1050   CHOLHDL 5.3 (H) 03/04/2017 1404   VLDL 46 (H) 03/04/2017 1404   LDLCALC 84 11/05/2023 0000   LDLCALC 141 (H) 10/21/2022 1050   Hepatic Function Panel     Component Value Date/Time   PROT 7.9 10/21/2022 1050   ALBUMIN 4.2 11/05/2023 0000   ALBUMIN  4.5 10/21/2022 1050   AST 19 11/05/2023 0000   ALT 14 11/05/2023 0000   ALKPHOS 68 11/05/2023 0000   BILITOT 0.5 10/21/2022 1050      Component Value Date/Time   TSH 1.350 10/21/2022 1050   Nutritional Lab Results  Component Value Date   VD25OH 50.8 10/21/2022   VD25OH 28 (L) 03/04/2017    Medications: Outpatient Encounter Medications as of 02/23/2024  Medication Sig   telmisartan (MICARDIS) 20 MG tablet Take 1 tablet (20 mg total) by mouth daily.   amLODipine  (NORVASC ) 5 MG tablet Take 1 tablet (5 mg total) by mouth daily.   cholecalciferol (VITAMIN D3) 25 MCG (1000 UNIT) tablet Take 1,000 Units by mouth daily.   Potassium 99 MG TABS Take 10 mg by mouth daily.   Rosuvastatin Calcium 5 MG CPSP Take by mouth.   topiramate  (TOPAMAX ) 25 MG tablet Take 1 tablet (25 mg total) by mouth every evening.   [DISCONTINUED] amLODipine  (NORVASC ) 5 MG tablet Take 1 tablet (5 mg total) by mouth daily.   [DISCONTINUED] topiramate  (TOPAMAX ) 25 MG tablet Take 1 tablet (25 mg total) by mouth every evening.   No facility-administered encounter medications on file as of 02/23/2024.      Follow-Up   Return in about 4 weeks (around 03/22/2024) for For Yesenia Mangement with Dr. Francyne.SABRA She was informed of the importance of frequent follow up visits to maximize her success with intensive lifestyle modifications for her multiple Montgomery conditions.  Attestation Statement   Reviewed by clinician on day of visit: allergies, medications, problem list, medical history, surgical history, family history, social history, and previous encounter notes.   I have spent 41 minutes in the care of the patient today including: 2 minutes before the visit reviewing and preparing the chart. 30 minutes face-to-face assessing and reviewing listed medical problems as outlined in obesity care plan, providing nutritional and behavioral counseling on topics outlined in the obesity care plan, counseling regarding anti-obesity medication as outlined in obesity care plan, independently interpreting test results and goals of care, as described in assessment and plan, reviewing and discussing biometric information and progress, and ordering medications - see orders 9 minutes after the visit updating chart and documentation of encounter.    Lucas Francyne, MD

## 2024-02-23 NOTE — Assessment & Plan Note (Signed)
 Detected on ultrasound of the liver in October 2024.  We will request records from Dr. Quentin Brunner office to review most recent CBC and liver enzymes.  Continue with current weight management strategy

## 2024-03-02 DIAGNOSIS — E669 Obesity, unspecified: Secondary | ICD-10-CM | POA: Diagnosis not present

## 2024-03-02 DIAGNOSIS — M25511 Pain in right shoulder: Secondary | ICD-10-CM | POA: Diagnosis not present

## 2024-03-02 DIAGNOSIS — R0683 Snoring: Secondary | ICD-10-CM | POA: Diagnosis not present

## 2024-03-02 DIAGNOSIS — E782 Mixed hyperlipidemia: Secondary | ICD-10-CM | POA: Diagnosis not present

## 2024-03-02 DIAGNOSIS — K7689 Other specified diseases of liver: Secondary | ICD-10-CM | POA: Diagnosis not present

## 2024-03-02 DIAGNOSIS — M25562 Pain in left knee: Secondary | ICD-10-CM | POA: Diagnosis not present

## 2024-03-02 DIAGNOSIS — R7301 Impaired fasting glucose: Secondary | ICD-10-CM | POA: Diagnosis not present

## 2024-03-02 DIAGNOSIS — E559 Vitamin D deficiency, unspecified: Secondary | ICD-10-CM | POA: Diagnosis not present

## 2024-03-02 DIAGNOSIS — I10 Essential (primary) hypertension: Secondary | ICD-10-CM | POA: Diagnosis not present

## 2024-03-02 DIAGNOSIS — R600 Localized edema: Secondary | ICD-10-CM | POA: Diagnosis not present

## 2024-03-02 DIAGNOSIS — J302 Other seasonal allergic rhinitis: Secondary | ICD-10-CM | POA: Diagnosis not present

## 2024-03-02 DIAGNOSIS — R944 Abnormal results of kidney function studies: Secondary | ICD-10-CM | POA: Diagnosis not present

## 2024-03-07 DIAGNOSIS — R7301 Impaired fasting glucose: Secondary | ICD-10-CM | POA: Diagnosis not present

## 2024-03-07 DIAGNOSIS — E782 Mixed hyperlipidemia: Secondary | ICD-10-CM | POA: Diagnosis not present

## 2024-03-07 DIAGNOSIS — E559 Vitamin D deficiency, unspecified: Secondary | ICD-10-CM | POA: Diagnosis not present

## 2024-03-21 ENCOUNTER — Other Ambulatory Visit (INDEPENDENT_AMBULATORY_CARE_PROVIDER_SITE_OTHER): Payer: Self-pay

## 2024-03-21 ENCOUNTER — Telehealth (INDEPENDENT_AMBULATORY_CARE_PROVIDER_SITE_OTHER): Payer: Self-pay | Admitting: Internal Medicine

## 2024-03-21 DIAGNOSIS — I10 Essential (primary) hypertension: Secondary | ICD-10-CM

## 2024-03-21 MED ORDER — TELMISARTAN 20 MG PO TABS: 20.0000 mg | ORAL_TABLET | Freq: Every day | ORAL | 0 refills | Status: DC

## 2024-03-21 NOTE — Telephone Encounter (Signed)
 Pt called in asking for a refill for telmisartan . Pt has an appt on 7/31 and only has on pill left for tomorrow. Pt declined to try to get an appt before 7/31. Please follow up with patient.

## 2024-03-21 NOTE — Telephone Encounter (Signed)
 Last visit 02/23/24, next visit 03/30/24 Ok to refill?

## 2024-03-30 ENCOUNTER — Encounter (INDEPENDENT_AMBULATORY_CARE_PROVIDER_SITE_OTHER): Payer: Self-pay | Admitting: Internal Medicine

## 2024-03-30 ENCOUNTER — Ambulatory Visit (INDEPENDENT_AMBULATORY_CARE_PROVIDER_SITE_OTHER): Admitting: Internal Medicine

## 2024-03-30 VITALS — BP 138/84 | HR 84 | Temp 98.4°F | Ht 67.0 in | Wt 227.0 lb

## 2024-03-30 DIAGNOSIS — I10 Essential (primary) hypertension: Secondary | ICD-10-CM

## 2024-03-30 DIAGNOSIS — R7303 Prediabetes: Secondary | ICD-10-CM | POA: Diagnosis not present

## 2024-03-30 DIAGNOSIS — K760 Fatty (change of) liver, not elsewhere classified: Secondary | ICD-10-CM

## 2024-03-30 DIAGNOSIS — E7800 Pure hypercholesterolemia, unspecified: Secondary | ICD-10-CM | POA: Diagnosis not present

## 2024-03-30 DIAGNOSIS — Z6835 Body mass index (BMI) 35.0-35.9, adult: Secondary | ICD-10-CM

## 2024-03-30 DIAGNOSIS — E669 Obesity, unspecified: Secondary | ICD-10-CM | POA: Diagnosis not present

## 2024-03-30 DIAGNOSIS — E66812 Obesity, class 2: Secondary | ICD-10-CM

## 2024-03-30 MED ORDER — TOPIRAMATE 25 MG PO TABS: 25.0000 mg | ORAL_TABLET | Freq: Every evening | ORAL | 0 refills | Status: DC

## 2024-03-30 MED ORDER — TELMISARTAN 20 MG PO TABS: 20.0000 mg | ORAL_TABLET | Freq: Every day | ORAL | 0 refills | Status: DC

## 2024-03-30 NOTE — Assessment & Plan Note (Signed)
 Her hemoglobin A1c has gone down from 5.8-5.3 and improved.  Continue with current weight management strategy

## 2024-03-30 NOTE — Assessment & Plan Note (Signed)
 Obesity management is progressing well with significant weight loss. Since starting topiramate  in March, there has been a consistent decrease in weight, with a current weight of 227 pounds. Body fat percentage has decreased from 51% to 46%, and visceral fat has decreased from 17 to 14. Muscle mass has increased from 112-113 to 116-118. A1c has improved from 5.8 to 5.3, indicating resolution of prediabetes. - Continue topiramate  with a 60-day supply, one tablet in the evening - Encourage continued healthy eating habits and weight management strategies

## 2024-03-30 NOTE — Assessment & Plan Note (Signed)
 Most recent lipid profile showed an LDL cholesterol of 90 and improved.  She is currently on rosuvastatin 5 mg a day without any adverse effects.  Continue statin therapy.

## 2024-03-30 NOTE — Assessment & Plan Note (Signed)
 Detected on ultrasound of the liver in October 2024.  Most recent CBC and liver enzymes are normal.  She has cut down on saturated fats simple and added sugars.  She does not drink alcohol continue current weight management strategy.

## 2024-03-30 NOTE — Assessment & Plan Note (Signed)
 I reviewed her blood pressure log her blood pressures have significantly improved now at goal staying consistently under 120/80.  She is on amlodipine  5 mg a day and telmisartan  20 mg daily.  She will continue current regimen.  I also recently reviewed labs from Dr. Milford office which shows normal renal parameters.

## 2024-03-30 NOTE — Progress Notes (Signed)
 Office: [phone]  /  Fax: [phone]  Weight Summary and Body Composition Analysis (BIA)  Vitals Temp: 98.4 F (36.9 C) BP: 138/84 Pulse Rate: 84 SpO2: 96 %   Anthropometric Measurements Height: 5' 7 (1.702 m) Weight: 227 lb (103 kg) BMI (Calculated): 35.55 Weight at Last Visit: 231 lb Weight Lost Since Last Visit: 4 lb Weight Gained Since Last Visit: 0 lb Starting Weight: 240 lb Total Weight Loss (lbs): 13 lb (5.897 kg) Peak Weight: 276  lb   Body Composition  Body Fat %: 46.1 % Fat Mass (lbs): 105 lbs Muscle Mass (lbs): 116.4 lbs Total Body Water (lbs): 88.4 lbs Visceral Fat Rating : 14    RMR: 1584  Today's Visit #: 19  Starting Date: 10/21/22   Subjective   Chief Complaint: Obesity  Interval History  Yesenia Montgomery is a 68 year old female with hypertension who presents for follow-up on blood pressure management and weight loss.  She has been monitoring her blood pressure since starting new medication in June, noting significant improvement with recent readings of 109 and 121 mmHg. Previously, her blood pressure was high, but it has improved since adjusting her medication regimen.  She is on a weight loss journey, having lost a total of 13 pounds. She attributes her success to consistent eating habits, maintaining a schedule of meals at 12:00 PM, 3:00 PM, and 6:00 PM. Her body fat percentage has decreased from 51% to 46%, and her visceral fat has reduced from 17 to 14. She has also gained muscle mass, increasing from 112-113 pounds to 116-118 pounds.  She started taking topiramate  in March to aid with appetite suppression and weight loss. Her appetite had already decreased before starting the medication due to her consistent eating habits. She takes the medication in the evening, sometimes forgetting but taking it if she wakes up during the night.  Recent lab work shows her A1c has decreased from 5.8% to 5.3%. Her LDL cholesterol has also improved,  dropping from 140 to 84. She has been taking vitamin D  supplements after being informed of a low level of 28.7, aiming to reach a normal level of 30.  She reports wearing smaller clothing sizes, having moved from a size 20-22 to a size 14-16, and feels lighter. She attributes her success to her consistent efforts and is motivated to continue her progress. Assessment & Plan     Challenges affecting patient progress: none.    Pharmacotherapy for weight management: She is currently taking Topiramate  (off label use, single agent) with adequate clinical response  and without side effects..   Assessment and Plan   Treatment Plan For Obesity:  Recommended Dietary Goals  Yesenia Montgomery is currently in the action stage of change. As such, her goal is to continue weight management plan. She has agreed to: continue current plan  Behavioral Health and Counseling  We discussed the following behavioral modification strategies today: continue to work on maintaining a reduced calorie state, getting the recommended amount of protein, incorporating whole foods, making healthy choices, staying well hydrated and practicing mindfulness when eating..  Additional education and resources provided today: None  Recommended Physical Activity Goals  Yesenia Montgomery has been advised to work up to 150 minutes of moderate intensity aerobic activity a week and strengthening exercises 2-3 times per week for cardiovascular health, weight loss maintenance and preservation of muscle mass.   She has agreed to :  continue to gradually increase the amount and intensity of exercise routine  Medical Interventions  and Pharmacotherapy  We discussed various medication options to help Yesenia Montgomery with her weight loss efforts and we both agreed to : Adequate clinical response to anti-obesity medication, continue current regimen  Associated Conditions Impacted by Obesity Treatment  Assessment & Plan Obesity - (Start BMI 37.57 Obesity  management is progressing well with significant weight loss. Since starting topiramate  in March, there has been a consistent decrease in weight, with a current weight of 227 pounds. Body fat percentage has decreased from 51% to 46%, and visceral fat has decreased from 17 to 14. Muscle mass has increased from 112-113 to 116-118. A1c has improved from 5.8 to 5.3, indicating resolution of prediabetes. - Continue topiramate  with a 60-day supply, one tablet in the evening - Encourage continued healthy eating habits and weight management strategies Essential hypertension I reviewed her blood pressure log her blood pressures have significantly improved now at goal staying consistently under 120/80.  She is on amlodipine  5 mg a day and telmisartan  20 mg daily.  She will continue current regimen.  I also recently reviewed labs from Dr. Milford office which shows normal renal parameters. Metabolic dysfunction-associated steatotic liver disease (MASLD) Detected on ultrasound of the liver in October 2024.  Most recent CBC and liver enzymes are normal.  She has cut down on saturated fats simple and added sugars.  She does not drink alcohol continue current weight management strategy.   Pure hypercholesterolemia Most recent lipid profile showed an LDL cholesterol of 90 and improved.  She is currently on rosuvastatin 5 mg a day without any adverse effects.  Continue statin therapy. Pre-diabetes Her hemoglobin A1c has gone down from 5.8-5.3 and improved.  Continue with current weight management strategy        Objective   Physical Exam:  Blood pressure 138/84, pulse 84, temperature 98.4 F (36.9 C), height 5' 7 (1.702 m), weight 227 lb (103 kg), SpO2 96%. Body mass index is 35.55 kg/m.  General: She is overweight, cooperative, alert, well developed, and in no acute distress. PSYCH: Has normal mood, affect and thought process.   HEENT: EOMI, sclerae are anicteric. Lungs: Normal breathing effort, no  conversational dyspnea. Extremities: No edema.  Neurologic: No gross sensory or motor deficits. No tremors or fasciculations noted.    Diagnostic Data Reviewed:  BMET    Component Value Date/Time   NA 139 11/05/2023 0000   K 4.7 11/05/2023 0000   CL 101 11/05/2023 0000   CO2 27 (A) 11/05/2023 0000   GLUCOSE 97 10/21/2022 1050   GLUCOSE 110 (H) 06/04/2020 1435   BUN 16 10/21/2022 1050   CREATININE 0.9 11/05/2023 0000   CREATININE 0.97 10/21/2022 1050   CREATININE 0.90 03/04/2017 1404   CALCIUM 9.7 11/05/2023 0000   GFRNONAA >60 06/04/2020 1435   GFRNONAA 69 03/04/2017 1404   GFRAA >60 10/28/2017 0418   GFRAA 79 03/04/2017 1404   Lab Results  Component Value Date   HGBA1C 5.8 (H) 10/21/2022   HGBA1C 5.6 03/04/2017   Lab Results  Component Value Date   INSULIN  12.7 10/21/2022   Lab Results  Component Value Date   TSH 1.350 10/21/2022   CBC    Component Value Date/Time   WBC 4.4 11/05/2023 0000   WBC 5.9 06/04/2020 1435   RBC 4.36 11/05/2023 0000   HGB 13.1 11/05/2023 0000   HCT 40 11/05/2023 0000   PLT 241 11/05/2023 0000   MCV 93.3 06/04/2020 1435   MCH 29.4 06/04/2020 1435   MCHC 31.5 06/04/2020 1435  RDW 13.4 06/04/2020 1435   Iron Studies No results found for: IRON, TIBC, FERRITIN, IRONPCTSAT Lipid Panel     Component Value Date/Time   CHOL 146 11/05/2023 0000   CHOL 216 (H) 10/21/2022 1050   TRIG 101 11/05/2023 0000   HDL 43 11/05/2023 0000   HDL 45 10/21/2022 1050   CHOLHDL 5.3 (H) 03/04/2017 1404   VLDL 46 (H) 03/04/2017 1404   LDLCALC 84 11/05/2023 0000   LDLCALC 141 (H) 10/21/2022 1050   Hepatic Function Panel     Component Value Date/Time   PROT 7.9 10/21/2022 1050   ALBUMIN 4.2 11/05/2023 0000   ALBUMIN 4.5 10/21/2022 1050   AST 19 11/05/2023 0000   ALT 14 11/05/2023 0000   ALKPHOS 68 11/05/2023 0000   BILITOT 0.5 10/21/2022 1050      Component Value Date/Time   TSH 1.350 10/21/2022 1050   Nutritional Lab Results   Component Value Date   VD25OH 50.8 10/21/2022   VD25OH 28 (L) 03/04/2017    Medications: Outpatient Encounter Medications as of 03/30/2024  Medication Sig   amLODipine  (NORVASC ) 5 MG tablet Take 1 tablet (5 mg total) by mouth daily.   cholecalciferol (VITAMIN D3) 25 MCG (1000 UNIT) tablet Take 1,000 Units by mouth daily.   Potassium 99 MG TABS Take 10 mg by mouth daily.   Rosuvastatin Calcium 5 MG CPSP Take by mouth.   [DISCONTINUED] telmisartan  (MICARDIS ) 20 MG tablet Take 1 tablet (20 mg total) by mouth daily.   [DISCONTINUED] topiramate  (TOPAMAX ) 25 MG tablet Take 1 tablet (25 mg total) by mouth every evening.   telmisartan  (MICARDIS ) 20 MG tablet Take 1 tablet (20 mg total) by mouth daily.   topiramate  (TOPAMAX ) 25 MG tablet Take 1 tablet (25 mg total) by mouth every evening.   No facility-administered encounter medications on file as of 03/30/2024.     Follow-Up   No follow-ups on file.SABRA She was informed of the importance of frequent follow up visits to maximize her success with intensive lifestyle modifications for her multiple health conditions.  Attestation Statement   Reviewed by clinician on day of visit: allergies, medications, problem list, medical history, surgical history, family history, social history, and previous encounter notes.     Lucas Parker, MD

## 2024-04-25 ENCOUNTER — Ambulatory Visit (INDEPENDENT_AMBULATORY_CARE_PROVIDER_SITE_OTHER): Admitting: Internal Medicine

## 2024-04-25 DIAGNOSIS — Z79899 Other long term (current) drug therapy: Secondary | ICD-10-CM | POA: Diagnosis not present

## 2024-04-25 DIAGNOSIS — I10 Essential (primary) hypertension: Secondary | ICD-10-CM | POA: Diagnosis not present

## 2024-04-25 DIAGNOSIS — M25562 Pain in left knee: Secondary | ICD-10-CM | POA: Diagnosis not present

## 2024-04-25 DIAGNOSIS — E669 Obesity, unspecified: Secondary | ICD-10-CM | POA: Diagnosis not present

## 2024-04-25 DIAGNOSIS — R21 Rash and other nonspecific skin eruption: Secondary | ICD-10-CM | POA: Diagnosis not present

## 2024-05-23 ENCOUNTER — Ambulatory Visit (INDEPENDENT_AMBULATORY_CARE_PROVIDER_SITE_OTHER): Admitting: Internal Medicine

## 2024-05-23 ENCOUNTER — Encounter (INDEPENDENT_AMBULATORY_CARE_PROVIDER_SITE_OTHER): Payer: Self-pay | Admitting: Internal Medicine

## 2024-05-23 VITALS — BP 146/84 | HR 61 | Temp 98.2°F | Ht 67.0 in | Wt 230.0 lb

## 2024-05-23 DIAGNOSIS — K760 Fatty (change of) liver, not elsewhere classified: Secondary | ICD-10-CM

## 2024-05-23 DIAGNOSIS — Z6837 Body mass index (BMI) 37.0-37.9, adult: Secondary | ICD-10-CM

## 2024-05-23 DIAGNOSIS — R7303 Prediabetes: Secondary | ICD-10-CM | POA: Diagnosis not present

## 2024-05-23 DIAGNOSIS — E66812 Obesity, class 2: Secondary | ICD-10-CM

## 2024-05-23 DIAGNOSIS — I10 Essential (primary) hypertension: Secondary | ICD-10-CM

## 2024-05-23 DIAGNOSIS — R948 Abnormal results of function studies of other organs and systems: Secondary | ICD-10-CM

## 2024-05-23 MED ORDER — TELMISARTAN 20 MG PO TABS: 20.0000 mg | ORAL_TABLET | Freq: Every day | ORAL | 0 refills | Status: AC

## 2024-05-23 NOTE — Progress Notes (Signed)
 Patient presents today for  Office: [phone]  /  Fax: [phone]  Weight Summary and Body Composition Analysis (BIA)  Vitals Temp: 98.2 F (36.8 C) BP: (!) 146/84 Pulse Rate: 61 SpO2: 98 %   Anthropometric Measurements Height: 5' 7 (1.702 m) Weight: 230 lb (104.3 kg) BMI (Calculated): 36.01 Weight at Last Visit: 227 lb Weight Lost Since Last Visit: 0 lb Weight Gained Since Last Visit: 3 lb Starting Weight: 240 lb Total Weight Loss (lbs): 10 lb (4.536 kg) Peak Weight: 276 lb   Body Composition  Body Fat %: 44 % Fat Mass (lbs): 101.2 lbs Muscle Mass (lbs): 122.4 lbs Total Body Water (lbs): 91.4 lbs Visceral Fat Rating : 14    RMR: 1584  Today's Visit #: 19  Starting Date: 10/21/22   Subjective   Chief Complaint: Obesity  Interval History Discussed the use of AI scribe software for clinical note transcription with the patient, who gave verbal consent to proceed.  History of Present Illness Yesenia Montgomery is a 68 year old female with hypertension, MASLD, prediabetes, and hyperlipidemia who presents for medical weight management.  She has gained three pounds since her last visit despite adhering to a 1000 calorie diet. Severe arthritis pain has led her to stop exercising, although she previously participated in multiple exercise classes per week, which exacerbated her pain. She plans to resume light exercise at home.  No symptoms of lightheadedness or dizziness are present.  She has a history of nausea with metformin  and was started on Topamax  in April for appetite suppression and weight loss. She is unsure of its effectiveness, as she has adjusted to her eating schedule over the past year and often has to remind herself to eat. Her metabolic rate was previously measured at 1600 calories, slower than expected, and she maintains a 500 calorie deficit with her current diet.  Her dietary habits focus on whole foods, emphasizing vegetables, nuts, and lean  proteins like salmon and chicken. She has significantly reduced sugary drinks and sodas, contributing to improved prediabetes status. She practices intermittent fasting, eating between 12 PM and 6 PM, and drinks primarily water, lemon water, or unsweetened tea.  She has been on a weight loss journey, having previously lost 30 pounds and maintained that loss for several years. Her weight plateaued until June of this year, when she began to see further weight loss. She attributes some recent weight gain to dietary changes, including increased carbohydrate intake, but has since adjusted her diet to include more protein and vegetables.     Challenges affecting patient progress: low volume of physical activity at present , slow metabolism for age, menopause, and metabolic adaptations associated with weight loss.    Pharmacotherapy for weight management: She is currently taking Topiramate  (off label use, single agent) with waning clinical response and without side effects..   Assessment and Plan   Treatment Plan For Obesity:  Recommended Dietary Goals  Yesenia Montgomery is currently in the action stage of change. As such, her goal is to continue weight management plan. She has agreed to: incorporate 1-2 meal replacements a day for convenience , continue current plan, and incorporate time restricted eating 16 hours fasting with an 8 hour eating window while maintaining reduced calorie nutrition plan  Behavioral Health and Counseling  We discussed the following behavioral modification strategies today: continue to work on maintaining a reduced calorie state, getting the recommended amount of protein, incorporating whole foods, making healthy choices, staying well hydrated and practicing mindfulness when eating.  and increase protein intake, fibrous foods (25 grams per day for women, 30 grams for men) and water to improve satiety and decrease hunger signals. .  Additional education and resources provided  today: None  Recommended Physical Activity Goals  Yesenia Montgomery has been advised to work up to 150 minutes of moderate intensity aerobic activity a week and strengthening exercises 2-3 times per week for cardiovascular health, weight loss maintenance and preservation of muscle mass.  She has agreed to :  Think about enjoyable ways to increase daily physical activity and overcoming barriers to exercise, Increase physical activity in their day and reduce sedentary time (increase NEAT)., Increase volume of physical activity to a goal of 240 minutes a week, and Combine aerobic and strengthening exercises for efficiency and improved cardiometabolic health.  Medical Interventions and Pharmacotherapy  We discussed various medication options to help Yesenia Montgomery with her weight loss efforts and we both agreed to : Due to lack of clinical effectiveness we will discontinue topiramate  she will take 1 tablet every other day for 1 week and then stop.  Associated Conditions Impacted by Obesity Treatment  Assessment & Plan Essential hypertension Her in office blood pressure reading was elevated twice.  I reviewed home blood pressure log and her blood pressures have been in the 110s to 120s consistently.    She is on amlodipine  5 mg a day and telmisartan  20 mg daily.  She will continue current regimen.  I also recently reviewed labs from Dr. Milford office which shows normal renal parameters. Metabolic dysfunction-associated steatotic liver disease (MASLD) Detected on ultrasound of the liver in October 2024.  Most recent CBC and liver enzymes are normal.  She has cut down on saturated fats simple and added sugars.  She does not drink alcohol continue current weight management strategy..  She does not have coverage for GLP-1. Pre-diabetes Her hemoglobin A1c has gone down from 5.8-5.3 and improved.  Continue with current weight management strategy.  She is currently not on metformin  due to medication side effects.  She also  does not have access to GLP-1. Abnormal metabolism Patient has a slower than predicted metabolism. IC 1584 vs. calculated 1734.  This has improved but still lower than expected.  This may contribute to weight gain, chronic fatigue and difficulty losing weight.   We reviewed measures to improve metabolism including not skipping meals, progressive strengthening exercises, increasing protein intake at every meal and maintaining adequate hydration and sleep. Class 2 severe obesity with serious comorbidity and body mass index (BMI) of 37.0 to 37.9 in adult, unspecified obesity type Obesity with metabolic comorbidities (hypertension, MASLD, prediabetes, hyperlipidemia) Obesity with associated metabolic comorbidities including hypertension, MASLD, prediabetes, and hyperlipidemia. Weight gain of three pounds since last visit despite adherence to a 1000 calorie diet. Metabolic rate is slower than expected at 1600 calories, contributing to weight management challenges. Exercise cessation may have impacted weight and muscle mass. Current regimen includes intermittent fasting with an 18-hour fasting window. Topiramate  was initiated for appetite suppression and weight loss, but its efficacy is uncertain. Discussion on the importance of gradual weight loss and metabolic adaptations from weight loss. - Discontinue topiramate  by taking one tablet every other day for a week, then stop. - Encourage resumption of light physical activity, such as walking on a treadmill for 20-30 minutes, as tolerated. - Download a tracking app and track dietary intake for three days a week to assess calorie and protein intake. - Incorporate a meal replacement with breakfast, such as a protein bar  and a piece of fruit, for two weeks. - Continue intermittent fasting with a focus on a balanced diet including whole foods, proteins, and vegetables.         Objective   Physical Exam:  Blood pressure (!) 146/84, pulse 61, temperature  98.2 F (36.8 C), height 5' 7 (1.702 m), weight 230 lb (104.3 kg), SpO2 98%. Body mass index is 36.02 kg/m.  General: She is overweight, cooperative, alert, well developed, and in no acute distress. PSYCH: Has normal mood, affect and thought process.   HEENT: EOMI, sclerae are anicteric. Lungs: Normal breathing effort, no conversational dyspnea. Extremities: No edema.  Neurologic: No gross sensory or motor deficits. No tremors or fasciculations noted.    Diagnostic Data Reviewed:  BMET    Component Value Date/Time   NA 139 11/05/2023 0000   K 4.7 11/05/2023 0000   CL 101 11/05/2023 0000   CO2 27 (A) 11/05/2023 0000   GLUCOSE 97 10/21/2022 1050   GLUCOSE 110 (H) 06/04/2020 1435   BUN 16 10/21/2022 1050   CREATININE 0.9 11/05/2023 0000   CREATININE 0.97 10/21/2022 1050   CREATININE 0.90 03/04/2017 1404   CALCIUM 9.7 11/05/2023 0000   GFRNONAA >60 06/04/2020 1435   GFRNONAA 69 03/04/2017 1404   GFRAA >60 10/28/2017 0418   GFRAA 79 03/04/2017 1404   Lab Results  Component Value Date   HGBA1C 5.8 (H) 10/21/2022   HGBA1C 5.6 03/04/2017   Lab Results  Component Value Date   INSULIN  12.7 10/21/2022   Lab Results  Component Value Date   TSH 1.350 10/21/2022   CBC    Component Value Date/Time   WBC 4.4 11/05/2023 0000   WBC 5.9 06/04/2020 1435   RBC 4.36 11/05/2023 0000   HGB 13.1 11/05/2023 0000   HCT 40 11/05/2023 0000   PLT 241 11/05/2023 0000   MCV 93.3 06/04/2020 1435   MCH 29.4 06/04/2020 1435   MCHC 31.5 06/04/2020 1435   RDW 13.4 06/04/2020 1435   Iron Studies No results found for: IRON, TIBC, FERRITIN, IRONPCTSAT Lipid Panel     Component Value Date/Time   CHOL 146 11/05/2023 0000   CHOL 216 (H) 10/21/2022 1050   TRIG 101 11/05/2023 0000   HDL 43 11/05/2023 0000   HDL 45 10/21/2022 1050   CHOLHDL 5.3 (H) 03/04/2017 1404   VLDL 46 (H) 03/04/2017 1404   LDLCALC 84 11/05/2023 0000   LDLCALC 141 (H) 10/21/2022 1050   Hepatic Function  Panel     Component Value Date/Time   PROT 7.9 10/21/2022 1050   ALBUMIN 4.2 11/05/2023 0000   ALBUMIN 4.5 10/21/2022 1050   AST 19 11/05/2023 0000   ALT 14 11/05/2023 0000   ALKPHOS 68 11/05/2023 0000   BILITOT 0.5 10/21/2022 1050      Component Value Date/Time   TSH 1.350 10/21/2022 1050   Nutritional Lab Results  Component Value Date   VD25OH 50.8 10/21/2022   VD25OH 28 (L) 03/04/2017    Medications: Outpatient Encounter Medications as of 05/23/2024  Medication Sig   amLODipine  (NORVASC ) 5 MG tablet Take 1 tablet (5 mg total) by mouth daily.   cholecalciferol (VITAMIN D3) 25 MCG (1000 UNIT) tablet Take 1,000 Units by mouth daily.   Potassium 99 MG TABS Take 10 mg by mouth daily.   Rosuvastatin Calcium 5 MG CPSP Take by mouth.   [DISCONTINUED] telmisartan  (MICARDIS ) 20 MG tablet Take 1 tablet (20 mg total) by mouth daily.   [DISCONTINUED] topiramate  (TOPAMAX ) 25 MG  tablet Take 1 tablet (25 mg total) by mouth every evening.   telmisartan  (MICARDIS ) 20 MG tablet Take 1 tablet (20 mg total) by mouth daily.   No facility-administered encounter medications on file as of 05/23/2024.     Follow-Up   Return in about 4 weeks (around 06/20/2024) for For Weight Mangement with Dr. Francyne.Yesenia Montgomery She was informed of the importance of frequent follow up visits to maximize her success with intensive lifestyle modifications for her multiple health conditions.  Attestation Statement   Reviewed by clinician on day of visit: allergies, medications, problem list, medical history, surgical history, family history, social history, and previous encounter notes.     Lucas Francyne, MD medical weight management associated

## 2024-05-23 NOTE — Assessment & Plan Note (Signed)
 Patient has a slower than predicted metabolism. IC 1584 vs. calculated 1734.  This has improved but still lower than expected.  This may contribute to weight gain, chronic fatigue and difficulty losing weight.   We reviewed measures to improve metabolism including not skipping meals, progressive strengthening exercises, increasing protein intake at every meal and maintaining adequate hydration and sleep.

## 2024-05-23 NOTE — Assessment & Plan Note (Signed)
 Her in office blood pressure reading was elevated twice.  I reviewed home blood pressure log and her blood pressures have been in the 110s to 120s consistently.    She is on amlodipine  5 mg a day and telmisartan  20 mg daily.  She will continue current regimen.  I also recently reviewed labs from Dr. Milford office which shows normal renal parameters.

## 2024-05-23 NOTE — Assessment & Plan Note (Signed)
 Detected on ultrasound of the liver in October 2024.  Most recent CBC and liver enzymes are normal.  She has cut down on saturated fats simple and added sugars.  She does not drink alcohol continue current weight management strategy..  She does not have coverage for GLP-1.

## 2024-05-23 NOTE — Assessment & Plan Note (Signed)
 Her hemoglobin A1c has gone down from 5.8-5.3 and improved.  Continue with current weight management strategy.  She is currently not on metformin  due to medication side effects.  She also does not have access to GLP-1.

## 2024-05-23 NOTE — Assessment & Plan Note (Signed)
 Obesity with metabolic comorbidities (hypertension, MASLD, prediabetes, hyperlipidemia) Obesity with associated metabolic comorbidities including hypertension, MASLD, prediabetes, and hyperlipidemia. Weight gain of three pounds since last visit despite adherence to a 1000 calorie diet. Metabolic rate is slower than expected at 1600 calories, contributing to weight management challenges. Exercise cessation may have impacted weight and muscle mass. Current regimen includes intermittent fasting with an 18-hour fasting window. Topiramate  was initiated for appetite suppression and weight loss, but its efficacy is uncertain. Discussion on the importance of gradual weight loss and metabolic adaptations from weight loss. - Discontinue topiramate  by taking one tablet every other day for a week, then stop. - Encourage resumption of light physical activity, such as walking on a treadmill for 20-30 minutes, as tolerated. - Download a tracking app and track dietary intake for three days a week to assess calorie and protein intake. - Incorporate a meal replacement with breakfast, such as a protein bar and a piece of fruit, for two weeks. - Continue intermittent fasting with a focus on a balanced diet including whole foods, proteins, and vegetables.

## 2024-06-02 DIAGNOSIS — Z79899 Other long term (current) drug therapy: Secondary | ICD-10-CM | POA: Diagnosis not present

## 2024-06-02 DIAGNOSIS — Z713 Dietary counseling and surveillance: Secondary | ICD-10-CM | POA: Diagnosis not present

## 2024-06-02 DIAGNOSIS — Z6835 Body mass index (BMI) 35.0-35.9, adult: Secondary | ICD-10-CM | POA: Diagnosis not present

## 2024-06-02 DIAGNOSIS — I10 Essential (primary) hypertension: Secondary | ICD-10-CM | POA: Diagnosis not present

## 2024-06-02 DIAGNOSIS — E669 Obesity, unspecified: Secondary | ICD-10-CM | POA: Diagnosis not present

## 2024-06-02 DIAGNOSIS — M25562 Pain in left knee: Secondary | ICD-10-CM | POA: Diagnosis not present

## 2024-06-02 DIAGNOSIS — Z7182 Exercise counseling: Secondary | ICD-10-CM | POA: Diagnosis not present

## 2024-06-02 DIAGNOSIS — L519 Erythema multiforme, unspecified: Secondary | ICD-10-CM | POA: Diagnosis not present

## 2024-06-02 DIAGNOSIS — R21 Rash and other nonspecific skin eruption: Secondary | ICD-10-CM | POA: Diagnosis not present

## 2024-06-13 ENCOUNTER — Other Ambulatory Visit: Payer: Self-pay

## 2024-06-14 NOTE — Progress Notes (Signed)
 Accidental refill creation, no refill being sent in

## 2024-06-20 ENCOUNTER — Encounter (INDEPENDENT_AMBULATORY_CARE_PROVIDER_SITE_OTHER): Payer: Self-pay | Admitting: Internal Medicine

## 2024-06-20 ENCOUNTER — Ambulatory Visit (INDEPENDENT_AMBULATORY_CARE_PROVIDER_SITE_OTHER): Admitting: Internal Medicine

## 2024-06-20 VITALS — BP 138/84 | HR 75 | Temp 97.8°F | Ht 67.0 in | Wt 227.0 lb

## 2024-06-20 DIAGNOSIS — R7303 Prediabetes: Secondary | ICD-10-CM

## 2024-06-20 DIAGNOSIS — Z6837 Body mass index (BMI) 37.0-37.9, adult: Secondary | ICD-10-CM

## 2024-06-20 DIAGNOSIS — K760 Fatty (change of) liver, not elsewhere classified: Secondary | ICD-10-CM

## 2024-06-20 DIAGNOSIS — I10 Essential (primary) hypertension: Secondary | ICD-10-CM | POA: Diagnosis not present

## 2024-06-20 DIAGNOSIS — E66812 Obesity, class 2: Secondary | ICD-10-CM

## 2024-06-20 DIAGNOSIS — R21 Rash and other nonspecific skin eruption: Secondary | ICD-10-CM | POA: Diagnosis not present

## 2024-06-20 NOTE — Assessment & Plan Note (Signed)
 Her hemoglobin A1c has gone down from 5.8-5.3 and improved.  Continue with current weight management strategy.  She is currently not on metformin  due to medication side effects.  She also does not have access to GLP-1.

## 2024-06-20 NOTE — Assessment & Plan Note (Signed)
 Weight: decrease of 18 lb (7.3%) over 1 year, 1 month  Start: 04/26/2023 245 lb (111.1 kg)  End: 06/20/2024 227 lb (103 kg)  Patient has had some success over time her weight loss has been gradual she has slow metabolic rate.  She does not skip meals anymore she is also been eating more whole foods and getting the recommended amount of protein.  We again discussed the importance of increasing physical activity as this will aid in her weight loss.  She had been on topiramate  without any clinical response to medication has been discontinued.  Metformin  was tried as well but she had adverse effects.

## 2024-06-20 NOTE — Assessment & Plan Note (Signed)
 Detected on ultrasound of the liver in October 2024.  Most recent CBC and liver enzymes are normal.  She has cut down on saturated fats simple and added sugars.  She does not drink alcohol continue current weight management strategy..  She does not have coverage for GLP-1.

## 2024-06-20 NOTE — Assessment & Plan Note (Signed)
 I reviewed home blood pressure monitoring.  Her blood pressures are well-controlled she is on amlodipine  5 mg a day and telmisartan  20 mg daily.

## 2024-06-20 NOTE — Progress Notes (Deleted)
 Office: [phone]  /  Fax: [phone]  Weight Summary and Body Composition Analysis (BIA)  Vitals Temp: 97.8 F (36.6 C) BP: 138/84 Pulse Rate: 75 SpO2: 98 %   Anthropometric Measurements Height: 5' 7 (1.702 m) Weight: 227 lb (103 kg) BMI (Calculated): 35.55 Weight at Last Visit: 230lb Weight Lost Since Last Visit: 3lb Weight Gained Since Last Visit: 0lb Starting Weight: 240lb Total Weight Loss (lbs): 13 lb (5.897 kg) Peak Weight: 276lb   Body Composition  Body Fat %: 42.5 % Fat Mass (lbs): 96.8 lbs Muscle Mass (lbs): 124.2 lbs Total Body Water (lbs): 89 lbs Visceral Fat Rating : 13    RMR: 1584  Today's Visit #: 20  Starting Date: 10/21/22   Subjective   Chief Complaint: Obesity  Interval History Discussed the use of AI scribe software for clinical note transcription with the patient, who gave verbal consent to proceed.  History of Present Illness      Challenges affecting patient progress: none.    Pharmacotherapy for weight management: She is currently taking no anti-obesity medication.   Assessment and Plan   Treatment Plan For Obesity:  Recommended Dietary Goals  Yesenia Montgomery is currently in the action stage of change. As such, her goal is to continue weight management plan. She has agreed to: continue current plan  Behavioral Health and Counseling  We discussed the following behavioral modification strategies today: continue to work on maintaining a reduced calorie state, getting the recommended amount of protein, incorporating whole foods, making healthy choices, staying well hydrated and practicing mindfulness when eating. and increase protein intake, fibrous foods (25 grams per day for women, 30 grams for men) and water to improve satiety and decrease hunger signals. .  Additional education and resources provided today: {EMadditionalresources:29169::None}  Recommended Physical Activity Goals  [name] has been advised to work up  to 150 minutes of moderate intensity aerobic activity a week and strengthening exercises 2-3 times per week for cardiovascular health, weight loss maintenance and preservation of muscle mass.  She has agreed to :  {EMEXERCISE:[id number]::Think about enjoyable ways to increase daily physical activity and overcoming barriers to exercise,Increase physical activity in their day and reduce sedentary time (increase NEAT).,Increase volume of physical activity to a goal of 240 minutes a week,Combine aerobic and strengthening exercises for efficiency and improved cardiometabolic health.}  Medical Interventions and Pharmacotherapy  We discussed various medication options to help Zachary with her weight loss efforts and we both agreed to : {EMagreedrx:29170}  Associated Conditions Impacted by Obesity Treatment  Assessment & Plan Essential hypertension  Metabolic dysfunction-associated steatotic liver disease (MASLD)  Pre-diabetes  Class 2 severe obesity with serious comorbidity and body mass index (BMI) of 37.0 to 37.9 in adult, unspecified obesity type     Assessment and Plan Assessment & Plan       Objective   Physical Exam:  Blood pressure 138/84, pulse 75, temperature 97.8 F (36.6 C), height 5' 7 (1.702 m), weight 227 lb (103 kg), SpO2 98%. Body mass index is 35.55 kg/m.  General: She is overweight, cooperative, alert, well developed, and in no acute distress. PSYCH: Has normal mood, affect and thought process.   HEENT: EOMI, sclerae are anicteric. Lungs: Normal breathing effort, no conversational dyspnea. Extremities: No edema.  Neurologic: No gross sensory or motor deficits. No tremors or fasciculations noted.    Diagnostic Data Reviewed:  BMET    Component Value Date/Time   NA 139 11/05/2023 0000   K 4.7 11/05/2023 0000   CL  101 11/05/2023 0000   CO2 27 (A) 11/05/2023 0000   GLUCOSE 97 10/21/2022 1050   GLUCOSE 110 (H) 06/04/2020 1435   BUN 16 10/21/2022 1050    CREATININE 0.9 11/05/2023 0000   CREATININE 0.97 10/21/2022 1050   CREATININE 0.90 03/04/2017 1404   CALCIUM 9.7 11/05/2023 0000   GFRNONAA >60 06/04/2020 1435   GFRNONAA 69 03/04/2017 1404   GFRAA >60 10/28/2017 0418   GFRAA 79 03/04/2017 1404   Lab Results  Component Value Date   HGBA1C 5.8 (H) 10/21/2022   HGBA1C 5.6 03/04/2017   Lab Results  Component Value Date   INSULIN  12.7 10/21/2022   Lab Results  Component Value Date   TSH 1.350 10/21/2022   CBC    Component Value Date/Time   WBC 4.4 11/05/2023 0000   WBC 5.9 06/04/2020 1435   RBC 4.36 11/05/2023 0000   HGB 13.1 11/05/2023 0000   HCT 40 11/05/2023 0000   PLT 241 11/05/2023 0000   MCV 93.3 06/04/2020 1435   MCH 29.4 06/04/2020 1435   MCHC 31.5 06/04/2020 1435   RDW 13.4 06/04/2020 1435   Iron Studies No results found for: IRON, TIBC, FERRITIN, IRONPCTSAT Lipid Panel     Component Value Date/Time   CHOL 146 11/05/2023 0000   CHOL 216 (H) 10/21/2022 1050   TRIG 101 11/05/2023 0000   HDL 43 11/05/2023 0000   HDL 45 10/21/2022 1050   CHOLHDL 5.3 (H) 03/04/2017 1404   VLDL 46 (H) 03/04/2017 1404   LDLCALC 84 11/05/2023 0000   LDLCALC 141 (H) 10/21/2022 1050   Hepatic Function Panel     Component Value Date/Time   PROT 7.9 10/21/2022 1050   ALBUMIN 4.2 11/05/2023 0000   ALBUMIN 4.5 10/21/2022 1050   AST 19 11/05/2023 0000   ALT 14 11/05/2023 0000   ALKPHOS 68 11/05/2023 0000   BILITOT 0.5 10/21/2022 1050      Component Value Date/Time   TSH 1.350 10/21/2022 1050   Nutritional Lab Results  Component Value Date   VD25OH 50.8 10/21/2022   VD25OH 28 (L) 03/04/2017    Medications: Outpatient Encounter Medications as of 06/20/2024  Medication Sig   amLODipine  (NORVASC ) 5 MG tablet Take 1 tablet (5 mg total) by mouth daily.   cholecalciferol (VITAMIN D3) 25 MCG (1000 UNIT) tablet Take 1,000 Units by mouth daily.   Potassium 99 MG TABS Take 10 mg by mouth daily.   Rosuvastatin  Calcium 5 MG CPSP Take by mouth.   telmisartan  (MICARDIS ) 20 MG tablet Take 1 tablet (20 mg total) by mouth daily.   No facility-administered encounter medications on file as of 06/20/2024.     Follow-Up   No follow-ups on file.SABRA She was informed of the importance of frequent follow up visits to maximize her success with intensive lifestyle modifications for her multiple health conditions.  Attestation Statement   Reviewed by clinician on day of visit: allergies, medications, problem list, medical history, surgical history, family history, social history, and previous encounter notes.     Lucas Parker, MD

## 2024-06-20 NOTE — Progress Notes (Signed)
 Office: [phone]  /  Fax: [phone]  Weight Summary And Body Composition Analysis (BIA)  Vitals Temp: 97.8 F (36.6 C) BP: 138/84 Pulse Rate: 75 SpO2: 98 %   Anthropometric Measurements Height: 5' 7 (1.702 m) Weight: 227 lb (103 kg) BMI (Calculated): 35.55 Weight at Last Visit: 230lb Weight Lost Since Last Visit: 3lb Weight Gained Since Last Visit: 0lb Starting Weight: 240lb Total Weight Loss (lbs): 13 lb (5.897 kg) Peak Weight: 276lb   Body Composition  Body Fat %: 42.5 % Fat Mass (lbs): 96.8 lbs Muscle Mass (lbs): 124.2 lbs Total Body Water (lbs): 89 lbs Visceral Fat Rating : 13    RMR: 1584  Today's Visit #: 20  Starting Date: 10/21/22   Subjective   Chief Complaint: Obesity  Yesenia Montgomery is here to discuss her progress with her obesity treatment plan. She is following the Category 1 plan - 1000 kcal per day and states she is following her eating plan approximately 90-100% of the time. She states she is exercising 30 minutes 3 times per week..  Weight Progress Since Last Visit:  Since last office visit she has lost 3 pounds. She reports good adherence to reduced calorie nutritional plan. She has been working on reading food labels, not skipping meals, increasing protein intake at every meal, drinking more water, making healthier choices, reducing portion sizes, and incorporating more whole foods   Nutritional 24 HR Recall: Intake consistent with prescribed nutritional plan  Challenges affecting patient progress: low volume of physical activity at present .   Orexigenic Control: Denies problems with appetite and hunger signals.  Denies problems with satiety and satiation.  Denies problems with eating patterns and portion control.  Denies abnormal cravings. Denies feeling deprived or restricted.   Pharmacotherapy for weight management: She is currently taking topiramate  was discontinued last office visit due to lack of clinical effectiveness.    Assessment and Plan   Treatment Plan For Obesity:  Recommended Dietary Goals  Sacora is currently in the action stage of change. As such, her goal is to continue weight management plan. She has agreed to: continue current plan  Behavioral Health and Counseling  We discussed the following behavioral modification strategies today: continue to work on maintaining a reduced calorie state, getting the recommended amount of protein, incorporating whole foods, making healthy choices, staying well hydrated and practicing mindfulness when eating. and increase protein intake, fibrous foods (25 grams per day for women, 30 grams for men) and water to improve satiety and decrease hunger signals. .  Additional education and resources provided today: None  Recommended Physical Activity Goals  Janai has been advised to work up to 150 minutes of moderate intensity aerobic activity a week and strengthening exercises 2-3 times per week for cardiovascular health, weight loss maintenance and preservation of muscle mass.   She has agreed to :  Think about enjoyable ways to increase daily physical activity and overcoming barriers to exercise, Increase physical activity in their day and reduce sedentary time (increase NEAT)., Increase volume of physical activity to a goal of 240 minutes a week, and Combine aerobic and strengthening exercises for efficiency and improved cardiometabolic health.  Pharmacotherapy and Medical Interventions  Continue with current nutritional and behavioral strategies  Associated Conditions Impacted by Obesity Treatment  Assessment & Plan Essential hypertension I reviewed home blood pressure monitoring.  Her blood pressures are well-controlled she is on amlodipine  5 mg a day and telmisartan  20 mg daily. Metabolic dysfunction-associated steatotic liver disease (MASLD) Detected on ultrasound  of the liver in October 2024.  Most recent CBC and liver enzymes are normal.  She has  cut down on saturated fats simple and added sugars.  She does not drink alcohol continue current weight management strategy..  She does not have coverage for GLP-1. Pre-diabetes Her hemoglobin A1c has gone down from 5.8-5.3 and improved.  Continue with current weight management strategy.  She is currently not on metformin  due to medication side effects.  She also does not have access to GLP-1. Class 2 severe obesity with serious comorbidity and body mass index (BMI) of 37.0 to 37.9 in adult, unspecified obesity type Weight: decrease of 18 lb (7.3%) over 1 year, 1 month  Start: 04/26/2023 245 lb (111.1 kg)  End: 06/20/2024 227 lb (103 kg)  Patient has had some success over time her weight loss has been gradual she has slow metabolic rate.  She does not skip meals anymore she is also been eating more whole foods and getting the recommended amount of protein.  We again discussed the importance of increasing physical activity as this will aid in her weight loss.  She had been on topiramate  without any clinical response to medication has been discontinued.  Metformin  was tried as well but she had adverse effects. Rash She has developed a rash over the last 4 weeks on the upper and lower extremities the started off as ring lesions with raised borders and later coalesced no significant itching, fever or chills she does report having knee pain.  Been seen by her PCP and has been referred to dermatology for which she has an appointment on Thursday.  On exam she has multiple coalescing patches of hyperpigmented skin without any erosions or scale.  There is sparing of the hands and feet.  Has the appearance of granuloma annulare but I do not see raised edges on exam.  She also has a topical lotion on limiting exam.  Patient advised to follow-up with dermatology.  She is to bring a list of her medications with her.    Objective   Physical Exam:  Blood pressure 138/84, pulse 75, temperature 97.8 F (36.6 C),  height 5' 7 (1.702 m), weight 227 lb (103 kg), SpO2 98%. Body mass index is 35.55 kg/m.  General: She is overweight, cooperative, alert, well developed, and in no acute distress. PSYCH: Has normal mood, affect and thought process.   HEENT: EOMI, sclerae are anicteric. Lungs: Normal breathing effort, no conversational dyspnea. Extremities: No edema.  Neurologic: No gross sensory or motor deficits. No tremors or fasciculations noted.    Diagnostic Data Reviewed:  BMET    Component Value Date/Time   NA 139 11/05/2023 0000   K 4.7 11/05/2023 0000   CL 101 11/05/2023 0000   CO2 27 (A) 11/05/2023 0000   GLUCOSE 97 10/21/2022 1050   GLUCOSE 110 (H) 06/04/2020 1435   BUN 16 10/21/2022 1050   CREATININE 0.9 11/05/2023 0000   CREATININE 0.97 10/21/2022 1050   CREATININE 0.90 03/04/2017 1404   CALCIUM 9.7 11/05/2023 0000   GFRNONAA >60 06/04/2020 1435   GFRNONAA 69 03/04/2017 1404   GFRAA >60 10/28/2017 0418   GFRAA 79 03/04/2017 1404   Lab Results  Component Value Date   HGBA1C 5.8 (H) 10/21/2022   HGBA1C 5.6 03/04/2017   Lab Results  Component Value Date   INSULIN  12.7 10/21/2022   Lab Results  Component Value Date   TSH 1.350 10/21/2022   CBC    Component Value Date/Time  WBC 4.4 11/05/2023 0000   WBC 5.9 06/04/2020 1435   RBC 4.36 11/05/2023 0000   HGB 13.1 11/05/2023 0000   HCT 40 11/05/2023 0000   PLT 241 11/05/2023 0000   MCV 93.3 06/04/2020 1435   MCH 29.4 06/04/2020 1435   MCHC 31.5 06/04/2020 1435   RDW 13.4 06/04/2020 1435   Iron Studies No results found for: IRON, TIBC, FERRITIN, IRONPCTSAT Lipid Panel     Component Value Date/Time   CHOL 146 11/05/2023 0000   CHOL 216 (H) 10/21/2022 1050   TRIG 101 11/05/2023 0000   HDL 43 11/05/2023 0000   HDL 45 10/21/2022 1050   CHOLHDL 5.3 (H) 03/04/2017 1404   VLDL 46 (H) 03/04/2017 1404   LDLCALC 84 11/05/2023 0000   LDLCALC 141 (H) 10/21/2022 1050   Hepatic Function Panel     Component  Value Date/Time   PROT 7.9 10/21/2022 1050   ALBUMIN 4.2 11/05/2023 0000   ALBUMIN 4.5 10/21/2022 1050   AST 19 11/05/2023 0000   ALT 14 11/05/2023 0000   ALKPHOS 68 11/05/2023 0000   BILITOT 0.5 10/21/2022 1050      Component Value Date/Time   TSH 1.350 10/21/2022 1050   Nutritional Lab Results  Component Value Date   VD25OH 50.8 10/21/2022   VD25OH 28 (L) 03/04/2017    Medications: Outpatient Encounter Medications as of 06/20/2024  Medication Sig   amLODipine  (NORVASC ) 5 MG tablet Take 1 tablet (5 mg total) by mouth daily.   cholecalciferol (VITAMIN D3) 25 MCG (1000 UNIT) tablet Take 1,000 Units by mouth daily.   Potassium 99 MG TABS Take 10 mg by mouth daily.   Rosuvastatin Calcium 5 MG CPSP Take by mouth.   telmisartan  (MICARDIS ) 20 MG tablet Take 1 tablet (20 mg total) by mouth daily.   No facility-administered encounter medications on file as of 06/20/2024.     Follow-Up   Return in about 6 weeks (around 08/01/2024).Yesenia Montgomery She was informed of the importance of frequent follow up visits to maximize her success with intensive lifestyle modifications for her multiple health conditions.  Attestation Statement   Reviewed by clinician on day of visit: allergies, medications, problem list, medical history, surgical history, family history, social history, and previous encounter notes.     Lucas Parker, MD

## 2024-06-22 DIAGNOSIS — L561 Drug photoallergic response: Secondary | ICD-10-CM | POA: Diagnosis not present

## 2024-07-13 DIAGNOSIS — E782 Mixed hyperlipidemia: Secondary | ICD-10-CM | POA: Diagnosis not present

## 2024-07-13 DIAGNOSIS — R7301 Impaired fasting glucose: Secondary | ICD-10-CM | POA: Diagnosis not present

## 2024-07-13 DIAGNOSIS — E559 Vitamin D deficiency, unspecified: Secondary | ICD-10-CM | POA: Diagnosis not present

## 2024-08-01 ENCOUNTER — Ambulatory Visit (INDEPENDENT_AMBULATORY_CARE_PROVIDER_SITE_OTHER): Payer: Self-pay | Admitting: Internal Medicine

## 2024-08-01 ENCOUNTER — Encounter (INDEPENDENT_AMBULATORY_CARE_PROVIDER_SITE_OTHER): Payer: Self-pay | Admitting: Internal Medicine

## 2024-08-01 VITALS — BP 139/86 | HR 87 | Temp 98.2°F | Ht 67.0 in | Wt 231.0 lb

## 2024-08-01 DIAGNOSIS — I10 Essential (primary) hypertension: Secondary | ICD-10-CM | POA: Diagnosis not present

## 2024-08-01 DIAGNOSIS — K760 Fatty (change of) liver, not elsewhere classified: Secondary | ICD-10-CM | POA: Diagnosis not present

## 2024-08-01 DIAGNOSIS — R7303 Prediabetes: Secondary | ICD-10-CM | POA: Diagnosis not present

## 2024-08-01 DIAGNOSIS — Z6837 Body mass index (BMI) 37.0-37.9, adult: Secondary | ICD-10-CM

## 2024-08-01 DIAGNOSIS — E66812 Obesity, class 2: Secondary | ICD-10-CM

## 2024-08-01 DIAGNOSIS — M170 Bilateral primary osteoarthritis of knee: Secondary | ICD-10-CM | POA: Diagnosis not present

## 2024-08-01 MED ORDER — WEGOVY 0.25 MG/0.5ML ~~LOC~~ SOAJ: 0.2500 mg | SUBCUTANEOUS | 0 refills | Status: DC

## 2024-08-01 NOTE — Progress Notes (Unsigned)
 Office: [phone]  /  Fax: [phone]  Weight Summary and Body Composition Analysis (BIA)  Vitals Temp: 98.2 F (36.8 C) BP: 139/86 Pulse Rate: 87 SpO2: 99 %   Anthropometric Measurements Height: 5' 7 (1.702 m) Weight: 231 lb (104.8 kg) BMI (Calculated): 36.17 Weight at Last Visit: 227 lb Weight Lost Since Last Visit: 0 lb Weight Gained Since Last Visit: 4 lb Starting Weight: 240 lb Total Weight Loss (lbs): 9 lb (4.082 kg) Peak Weight: 276 lb   Body Composition  Body Fat %: 50.4 % Fat Mass (lbs): 116.6 lbs Muscle Mass (lbs): 109 lbs Total Body Water (lbs): 85.8 lbs Visceral Fat Rating : 16    RMR: 1584  Today's Visit #: 21  Starting Date: 10/21/22   Subjective   Chief Complaint: Obesity  Interval History Discussed the use of AI scribe software for clinical note transcription with the patient, who gave verbal consent to proceed.  History of Present Illness Yesenia Montgomery is a 68 year old female who presents for medical weight management.  Since the last visit, she has gained four pounds despite maintaining a healthy diet through the holidays. Her weight has been fluctuating between 229 to 232 pounds. She measures her food intake and uses a scale to track her weight.  She has been attempting to increase her physical activity, exercising for 15 to 20 minutes sporadically, but experiences difficulty due to leg pain and swelling, particularly in her knee, which she attributes to weather changes. She has been able to go to the gym only twice in the past month and primarily exercises at home.  She stopped taking her blood pressure medication due to consistently low readings. She also discontinued a diuretic prescribed by a dermatologist after experiencing a breakout; the dermatologist attributed the reaction to the medication's sodium content.  She drinks 82 ounces of water daily and has been consistent with her diet, which includes fresh vegetables and  measured portions of food. She avoids pasta and typically consumes salads and baked chicken.  She previously lost weight on Mounjaro , a medication she took twice before her insurance stopped covering it. She is not interested in weight loss medications due to past side effects and prefers to manage her weight through lifestyle changes.     Challenges affecting patient progress: slow metabolism for age, menopause, inadequate response to nutritional and behavioral strategies, and inadequate response to pharmacotherapy.    Pharmacotherapy for weight management: She is currently taking no anti-obesity medication and tried metformin  and zonisamide in the past.  She was briefly on Mounjaro  which actually helped her lose weight but treatment was brief due to inadequate coverage..   Assessment and Plan   Treatment Plan For Obesity:  Recommended Dietary Goals  Yesenia Montgomery is currently in the action stage of change. As such, her goal is to continue weight management plan. She has agreed to: incorporate 1-2 meal replacements a day for convenience  and continue current plan  Behavioral Health and Counseling  We discussed the following behavioral modification strategies today: work on tracking and journaling calories using tracking application, continue to work on maintaining a reduced calorie state, getting the recommended amount of protein, incorporating whole foods, making healthy choices, staying well hydrated and practicing mindfulness when eating., and increase protein intake, fibrous foods (25 grams per day for women, 30 grams for men) and water to improve satiety and decrease hunger signals. .  Additional education and resources provided today: Handout on traveling and holiday eating strategies  Recommended Physical Activity Goals  Yesenia Montgomery has been advised to work up to 150 minutes of moderate intensity aerobic activity a week and strengthening exercises 2-3 times per week for cardiovascular health,  weight loss maintenance and preservation of muscle mass.  She has agreed to :  Increase and monitor steps for a goal of 10,000 per day, Increase volume of physical activity to a goal of 240 minutes a week, and Combine aerobic and strengthening exercises for efficiency and improved cardiometabolic health.  Medical Interventions and Pharmacotherapy  We discussed various medication options to help Yesenia Montgomery with her weight loss efforts and we both agreed to : We discussed various medication options to help Yesenia Montgomery with her weight loss efforts and we both agreed to : start anti-obesity medication.  In addition to reduced calorie nutrition plan (RCNP), behavioral strategies and physical activity, Yesenia Montgomery would benefit from pharmacotherapy to assist with hunger signals, satiety and cravings. This will reduce obesity-related health risks by inducing weight loss, and help reduce food consumption and adherence to [facility]). It may also improve QOL by improving self-confidence and reduce the setbacks associated with metabolic adaptations.  He also has high risk comorbidities associated with her obesity including: Osteoarthritis of both knees affecting quality of life and mobility, MASLD, hypertension, prediabetes, hyperlipidemia.  She also has a slow metabolic rate all of these conditions increase her risk for future complications and are either improved or potentially reverse through sustained weight loss.  GLP-1 receptor agonists have been shown in robust clinical trials to: Enhance satiety and delay gastric emptying, resulting in reduced caloric intake Improve insulin  sensitivity and glycemic control Promote clinically meaningful weight loss (>=5-22%) Reduce cardiovascular risk markers, including blood pressure, lipids, and inflammation  Given Yesenia Montgomery clinical profile--GLP-1 therapy is both indicated and expected to provide multifactorial benefit. The medication's mechanism aligns precisely with the patient's  needs and addresses the physiologic underpinnings of weight gain.   After a detailed discussion covering treatment rationale, mechanism of action, expected outcomes, risks, and long-term use considerations, shared decision-making was used to initiate Wegovy 0.25 mg once a week. The importance of ongoing lifestyle management and long-term adherence was emphasized, as was the potential for weight regain following discontinuation.  The delivery device was demonstrated, and using the teach-back method, Yesenia Montgomery successfully demonstrated proper injection technique. Ongoing monitoring and follow-up are planned to assess tolerability, clinical response, and reinforce behavioral strategies.   Associated Conditions Impacted by Obesity Treatment  Assessment & Plan Class 2 severe obesity with serious comorbidity and body mass index (BMI) of 37.0 to 37.9 in adult, unspecified obesity type Patient has been in our program since February 2024 with a peak weight loss of 12 pounds.  She has attempted multiple nutritional strategies including calorie restricted meal plans, tracking and journaling and use of meal replacements.  Despite good adherence she has difficulty losing weight.  She continues to work on increasing volume of physical activity but is limited due to advanced osteoarthritis of her knees.  She has several obesity related cardiometabolic conditions that increase her risk of future complications.  I feel that she would be a good candidate for GLP-1 aided weight management particularly in the presence of MASLD.  She will be started on Wegovy 0.25 mg once a week Metabolic dysfunction-associated steatotic liver disease (MASLD) Detected on ultrasound of the liver in October 2024.  Fibrosis 4 Score = 1.45  She has cut down on saturated fats and simple sugars in her diet.  She does not drink alcohol.  I  think she would benefit from GLP-1 to reduce the risk of fibrosis.  She will be started on Wegovy  2.5 mg once  a week Pre-diabetes Her hemoglobin A1c has gone down from 5.8-5.3 and improved.  Continue with current weight management strategy.  She is currently not on metformin  due to medication side effects.  I feel that in addition to nutritional strategies she would benefit from pharmacoprophylaxis with GLP-1 if she is at risk for progression. Primary osteoarthritis of both knees Due to mass effects of her weight losing 10 to 15% of body weight may improve symptomatology and functional status. Essential hypertension Her medications have been modified because of skin rash that has not resolved she is followed by dermatology this is causing a slight elevation in her blood pressure.  It was felt that hydrochlorothiazide  was the culprit.  She will follow-up with dermatologist.  Starting treatment with semaglutide  may help with blood pressure control.          Objective   Physical Exam:  Blood pressure 139/86, pulse 87, temperature 98.2 F (36.8 C), height 5' 7 (1.702 m), weight 231 lb (104.8 kg), SpO2 99%. Body mass index is 36.18 kg/m.  General: She is overweight, cooperative, alert, well developed, and in no acute distress. PSYCH: Has normal mood, affect and thought process.   HEENT: EOMI, sclerae are anicteric. Lungs: Normal breathing effort, no conversational dyspnea. Extremities: No edema.  Neurologic: No gross sensory or motor deficits. No tremors or fasciculations noted.    Diagnostic Data Reviewed:  BMET    Component Value Date/Time   NA 139 11/05/2023 0000   K 4.7 11/05/2023 0000   CL 101 11/05/2023 0000   CO2 27 (A) 11/05/2023 0000   GLUCOSE 97 10/21/2022 1050   GLUCOSE 110 (H) 06/04/2020 1435   BUN 16 10/21/2022 1050   CREATININE 0.9 11/05/2023 0000   CREATININE 0.97 10/21/2022 1050   CREATININE 0.90 03/04/2017 1404   CALCIUM 9.7 11/05/2023 0000   GFRNONAA >60 06/04/2020 1435   GFRNONAA 69 03/04/2017 1404   GFRAA >60 10/28/2017 0418   GFRAA 79 03/04/2017 1404   Lab  Results  Component Value Date   HGBA1C 5.8 (H) 10/21/2022   HGBA1C 5.6 03/04/2017   Lab Results  Component Value Date   INSULIN  12.7 10/21/2022   Lab Results  Component Value Date   TSH 1.350 10/21/2022   CBC    Component Value Date/Time   WBC 4.4 11/05/2023 0000   WBC 5.9 06/04/2020 1435   RBC 4.36 11/05/2023 0000   HGB 13.1 11/05/2023 0000   HCT 40 11/05/2023 0000   PLT 241 11/05/2023 0000   MCV 93.3 06/04/2020 1435   MCH 29.4 06/04/2020 1435   MCHC 31.5 06/04/2020 1435   RDW 13.4 06/04/2020 1435   Iron Studies No results found for: IRON, TIBC, FERRITIN, IRONPCTSAT Lipid Panel     Component Value Date/Time   CHOL 146 11/05/2023 0000   CHOL 216 (H) 10/21/2022 1050   TRIG 101 11/05/2023 0000   HDL 43 11/05/2023 0000   HDL 45 10/21/2022 1050   CHOLHDL 5.3 (H) 03/04/2017 1404   VLDL 46 (H) 03/04/2017 1404   LDLCALC 84 11/05/2023 0000   LDLCALC 141 (H) 10/21/2022 1050   Hepatic Function Panel     Component Value Date/Time   PROT 7.9 10/21/2022 1050   ALBUMIN 4.2 11/05/2023 0000   ALBUMIN 4.5 10/21/2022 1050   AST 19 11/05/2023 0000   ALT 14 11/05/2023 0000   ALKPHOS 68  11/05/2023 0000   BILITOT 0.5 10/21/2022 1050      Component Value Date/Time   TSH 1.350 10/21/2022 1050   Nutritional Lab Results  Component Value Date   VD25OH 50.8 10/21/2022   VD25OH 28 (L) 03/04/2017    Medications: Outpatient Encounter Medications as of 08/01/2024  Medication Sig   amLODipine  (NORVASC ) 5 MG tablet Take 1 tablet (5 mg total) by mouth daily.   cholecalciferol (VITAMIN D3) 25 MCG (1000 UNIT) tablet Take 1,000 Units by mouth daily.   Potassium 99 MG TABS Take 10 mg by mouth daily.   Rosuvastatin Calcium 5 MG CPSP Take by mouth.   telmisartan  (MICARDIS ) 20 MG tablet Take 1 tablet (20 mg total) by mouth daily.   [DISCONTINUED] semaglutide-weight management (WEGOVY) 0.25 MG/0.5ML SOAJ SQ injection Inject 0.25 mg into the skin once a week.   semaglutide-weight  management (WEGOVY) 0.25 MG/0.5ML SOAJ SQ injection Inject 0.25 mg into the skin once a week.   [DISCONTINUED] semaglutide-weight management (WEGOVY) 0.25 MG/0.5ML SOAJ SQ injection Inject 0.25 mg into the skin once a week.   No facility-administered encounter medications on file as of 08/01/2024.     Follow-Up   Return in about 4 weeks (around 08/29/2024) for For Weight Mangement with Dr. Francyne.SABRA She was informed of the importance of frequent follow up visits to maximize her success with intensive lifestyle modifications for her multiple health conditions.  Attestation Statement   Reviewed by clinician on day of visit: allergies, medications, problem list, medical history, surgical history, family history, social history, and previous encounter notes.     Lucas Francyne, MD

## 2024-08-02 NOTE — Assessment & Plan Note (Signed)
 Due to mass effects of her weight losing 10 to 15% of body weight may improve symptomatology and functional status.

## 2024-08-02 NOTE — Assessment & Plan Note (Signed)
 Detected on ultrasound of the liver in October 2024.  Fibrosis 4 Score = 1.45  She has cut down on saturated fats and simple sugars in her diet.  She does not drink alcohol.  I think she would benefit from GLP-1 to reduce the risk of fibrosis.  She will be started on Wegovy 2.5 mg once a week

## 2024-08-02 NOTE — Assessment & Plan Note (Addendum)
 Her medications have been modified because of skin rash that has not resolved she is followed by dermatology this is causing a slight elevation in her blood pressure.  It was felt that hydrochlorothiazide  was the culprit.  She will follow-up with dermatologist.  Starting treatment with semaglutide may help with blood pressure control.

## 2024-08-02 NOTE — Assessment & Plan Note (Signed)
 Patient has been in our program since February 2024 with a peak weight loss of 12 pounds.  She has attempted multiple nutritional strategies including calorie restricted meal plans, tracking and journaling and use of meal replacements.  Despite good adherence she has difficulty losing weight.  She continues to work on increasing volume of physical activity but is limited due to advanced osteoarthritis of her knees.  She has several obesity related cardiometabolic conditions that increase her risk of future complications.  I feel that she would be a good candidate for GLP-1 aided weight management particularly in the presence of MASLD.  She will be started on Wegovy 0.25 mg once a week

## 2024-08-02 NOTE — Assessment & Plan Note (Signed)
 Her hemoglobin A1c has gone down from 5.8-5.3 and improved.  Continue with current weight management strategy.  She is currently not on metformin  due to medication side effects.  I feel that in addition to nutritional strategies she would benefit from pharmacoprophylaxis with GLP-1 if she is at risk for progression.

## 2024-08-03 ENCOUNTER — Telehealth (INDEPENDENT_AMBULATORY_CARE_PROVIDER_SITE_OTHER): Payer: Self-pay

## 2024-08-03 NOTE — Telephone Encounter (Signed)
 PA Wegovy 0.25 started

## 2024-08-07 ENCOUNTER — Encounter (INDEPENDENT_AMBULATORY_CARE_PROVIDER_SITE_OTHER): Payer: Self-pay

## 2024-08-07 NOTE — Telephone Encounter (Signed)
 Wegovy  denied - Medicare- not covered, pt notified

## 2024-08-08 ENCOUNTER — Encounter (INDEPENDENT_AMBULATORY_CARE_PROVIDER_SITE_OTHER): Payer: Self-pay | Admitting: Internal Medicine

## 2024-08-08 DIAGNOSIS — M25511 Pain in right shoulder: Secondary | ICD-10-CM | POA: Diagnosis not present

## 2024-08-08 DIAGNOSIS — M25562 Pain in left knee: Secondary | ICD-10-CM | POA: Diagnosis not present

## 2024-08-08 DIAGNOSIS — E559 Vitamin D deficiency, unspecified: Secondary | ICD-10-CM | POA: Diagnosis not present

## 2024-08-08 DIAGNOSIS — E782 Mixed hyperlipidemia: Secondary | ICD-10-CM | POA: Diagnosis not present

## 2024-08-08 DIAGNOSIS — R600 Localized edema: Secondary | ICD-10-CM | POA: Diagnosis not present

## 2024-08-08 DIAGNOSIS — K760 Fatty (change of) liver, not elsewhere classified: Secondary | ICD-10-CM | POA: Diagnosis not present

## 2024-08-08 DIAGNOSIS — Z23 Encounter for immunization: Secondary | ICD-10-CM | POA: Diagnosis not present

## 2024-08-08 DIAGNOSIS — R7301 Impaired fasting glucose: Secondary | ICD-10-CM | POA: Diagnosis not present

## 2024-08-08 DIAGNOSIS — R944 Abnormal results of kidney function studies: Secondary | ICD-10-CM | POA: Diagnosis not present

## 2024-08-08 DIAGNOSIS — E669 Obesity, unspecified: Secondary | ICD-10-CM | POA: Diagnosis not present

## 2024-08-08 DIAGNOSIS — M25561 Pain in right knee: Secondary | ICD-10-CM | POA: Diagnosis not present

## 2024-08-08 DIAGNOSIS — I10 Essential (primary) hypertension: Secondary | ICD-10-CM | POA: Diagnosis not present

## 2024-08-08 LAB — PROTEIN / CREATININE RATIO, URINE
Albumin, U: <3
Creatinine, Urine: 63.1

## 2024-08-08 LAB — LIPID PANEL
Cholesterol: 151 (ref 0–200)
HDL: 50 (ref 35–70)
LDL Cholesterol: 84
LDl/HDL Ratio: 3
Triglycerides: 91 (ref 40–160)

## 2024-08-08 LAB — VITAMIN D 25 HYDROXY (VIT D DEFICIENCY, FRACTURES): Vit D, 25-Hydroxy: 28.4

## 2024-08-08 LAB — HEMOGLOBIN A1C: Hemoglobin A1C: 5.5

## 2024-08-08 LAB — CBC AND DIFFERENTIAL
HCT: 42 (ref 36–46)
Hemoglobin: 13.3 (ref 12.0–16.0)
Platelets: 276 K/uL (ref 150–400)
WBC: 5.3

## 2024-08-08 LAB — HEPATIC FUNCTION PANEL
ALT: 10 U/L (ref 7–35)
AST: 15 (ref 13–35)
Alkaline Phosphatase: 70 (ref 25–125)
Bilirubin, Total: 0.4

## 2024-08-08 LAB — COMPREHENSIVE METABOLIC PANEL WITH GFR
Albumin: 4.3 (ref 3.5–5.0)
Calcium: 9.7 (ref 8.7–10.7)
Globulin: 2.7
eGFR: 62

## 2024-08-08 LAB — CBC: RBC: 4.48 (ref 3.87–5.11)

## 2024-08-08 LAB — BASIC METABOLIC PANEL WITH GFR
BUN: 19 (ref 4–21)
Creatinine: 1 (ref 0.5–1.1)
Glucose: 107

## 2024-08-08 LAB — MICROALBUMIN / CREATININE URINE RATIO: Microalb Creat Ratio: <5

## 2024-08-29 ENCOUNTER — Ambulatory Visit (INDEPENDENT_AMBULATORY_CARE_PROVIDER_SITE_OTHER): Admitting: Internal Medicine

## 2024-08-29 ENCOUNTER — Encounter (INDEPENDENT_AMBULATORY_CARE_PROVIDER_SITE_OTHER): Payer: Self-pay | Admitting: Internal Medicine

## 2024-08-29 VITALS — BP 162/78 | HR 79 | Temp 98.1°F | Ht 67.0 in | Wt 225.0 lb

## 2024-08-29 DIAGNOSIS — R7303 Prediabetes: Secondary | ICD-10-CM

## 2024-08-29 DIAGNOSIS — Z6837 Body mass index (BMI) 37.0-37.9, adult: Secondary | ICD-10-CM

## 2024-08-29 DIAGNOSIS — K760 Fatty (change of) liver, not elsewhere classified: Secondary | ICD-10-CM

## 2024-08-29 DIAGNOSIS — E7800 Pure hypercholesterolemia, unspecified: Secondary | ICD-10-CM

## 2024-08-29 DIAGNOSIS — I10 Essential (primary) hypertension: Secondary | ICD-10-CM | POA: Diagnosis not present

## 2024-08-29 DIAGNOSIS — E669 Obesity, unspecified: Secondary | ICD-10-CM | POA: Diagnosis not present

## 2024-08-29 DIAGNOSIS — Z6835 Body mass index (BMI) 35.0-35.9, adult: Secondary | ICD-10-CM | POA: Diagnosis not present

## 2024-08-29 NOTE — Progress Notes (Signed)
 Associated  Office: [phone]  /  Fax: [phone]  Weight Summary and Body Composition Analysis (BIA)  Vitals Temp: 98.1 F (36.7 C) BP: (!) 162/78 Pulse Rate: 79 SpO2: 97 %   Anthropometric Measurements Height: 5' 7 (1.702 m) Weight: 225 lb (102.1 kg) BMI (Calculated): 35.23 Weight at Last Visit: 231 lb Weight Lost Since Last Visit: 6 lb Weight Gained Since Last Visit: 0 Starting Weight: 240 lb Total Weight Loss (lbs): 15 lb (6.804 kg) Peak Weight: 276 lb   Body Composition  Body Fat %: 48.3 % Fat Mass (lbs): 108.8 lbs Muscle Mass (lbs): 110.6 lbs Total Body Water (lbs): 82.6 lbs Visceral Fat Rating : 15    RMR: 1584  Today's Visit #: 22  Starting Date: 10/21/22   Subjective   Chief Complaint: Obesity  Interval History  Discussed the use of AI scribe software for clinical note transcription with the patient, who gave verbal consent to proceed.  History of Present Illness BRYELLA DIVINEY is a 68 year old female with hypertension, MASLD, prediabetes, and hyperlipidemia who presents for medical weight management.  She has lost six pounds since her last visit and follows a low-carb, moderate-protein diet plan, consuming about 1000 calories per day, 85% of the time. She incorporates water fasting into her routine, fasting for three to seven days at a time, and eating within an eight-hour window starting at 6 PM. She drinks water, black coffee, and green tea during fasting periods. She notes a reduction in her waist, hip, and chest measurements since February 2024.  She is currently on telmisartan  20 mg and amlodipine  5 mg for blood pressure, and rosuvastatin 5 mg for cholesterol. She recently changed the timing of her medication intake from morning to 6 PM with her meal and has not missed any doses. She has not been consistently tracking her blood pressure at home recently.  She reports increased physical activity since November, exercising three to four days  a week as her arthritis and body allow. She uses a circle glider and wears sauna workout pants, a waist training belt, and arm sleeves during activities. She experiences muscle cramps, particularly after increased activity, and attributes this to overexertion.  Her recent blood work from November included blood counts, kidney function, and liver enzymes. Her fasting blood sugar was 107, and her A1c was 5.5. Her LDL cholesterol was 84, and HDL was 50. She has started taking vitamin D3 with K2 after noticing low vitamin D  levels.  No hunger during fasting periods. No recent use of energy supplements. Occasionally uses ibuprofen  for pain, which she acknowledges can raise her blood pressure.     Challenges affecting patient progress: slow metabolism for age and menopause.    Pharmacotherapy for weight management: She is currently taking no anti-obesity medication and does not have coverage for GLP-1.   Assessment and Plan   Treatment Plan For Obesity:  Recommended Dietary Goals  Yesenia Montgomery is currently in the action stage of change. As such, her goal is to continue weight management plan. She has agreed to: continue current plan  Behavioral Health and Counseling  We discussed the following behavioral modification strategies today: continue to work on maintaining a reduced calorie state, getting the recommended amount of protein, incorporating whole foods, making healthy choices, staying well hydrated and practicing mindfulness when eating. and increase protein intake, fibrous foods (25 grams per day for women, 30 grams for men) and water to improve satiety and decrease hunger signals. .  Additional education and  resources provided today: None  Recommended Physical Activity Goals  Jmya has been advised to work up to 150 minutes of moderate intensity aerobic activity a week and strengthening exercises 2-3 times per week for cardiovascular health, weight loss maintenance and preservation of  muscle mass.  She has agreed to :  Increase volume of physical activity to a goal of 240 minutes a week and Combine aerobic and strengthening exercises for efficiency and improved cardiometabolic health.  Medical Interventions and Pharmacotherapy  We discussed various medication options to help Jeniece with her weight loss efforts and we both agreed to : Continue with current nutritional and behavioral strategies and prescribed GLP-1 in the past but denied by insurance and cost prohibitive  Associated Conditions Impacted by Obesity Treatment  Assessment & Plan Pre-diabetes  Metabolic dysfunction-associated steatotic liver disease (MASLD)  Pure hypercholesterolemia  Class 2 severe obesity with serious comorbidity and body mass index (BMI) of 37.0 to 37.9 in adult, unspecified obesity type  Essential hypertension     Assessment and Plan Assessment & Plan Obesity Management is ongoing with significant weight loss of six pounds since the last visit. She follows a 1000 calorie, low-carb, moderate-protein nutrition plan 85% of the time and practices intermittent fasting with an 8-hour eating window. Physical activity has increased to 3-4 days a week, though limited by arthritis. Insurance denied Wegovy , but she has been successful with lifestyle modifications. - Continue current nutrition plan and intermittent fasting regimen. - Encouraged regular physical activity as tolerated. - Will reassess weight management plan in 8 weeks.  Hypertension Blood pressure is elevated today. She recently changed medication timing to 6 PM with meals. Possible inadequate control due to medication timing. Ibuprofen  use may contribute to elevated blood pressure. - Monitor blood pressure at different times of the day for three days to assess pattern. - Will consider splitting antihypertensive doses if blood pressure remains elevated in the afternoon and evening. - Avoid NSAIDs like ibuprofen  to prevent  further elevation of blood pressure.  Metabolic dysfunction-associated steatotic liver disease Recent labs done at Dr. Milford office she has normal platelet count of her enzymes.   GLP-1 has been denied by her insurance  Prediabetes Fasting blood sugar is 107 mg/dL, slightly elevated. A1c is 5.5%, indicating good control. Lifestyle modifications, including diet and exercise, are contributing to stable glucose levels. - Continue current dietary and exercise regimen to maintain glucose control. - Did not tolerate metformin   Hyperlipidemia Lipid profile shows LDL at 84 mg/dL and HDL at 50 mg/dL, indicating good control. - Continue current lipid-lowering therapy.        Objective   Physical Exam:  Blood pressure (!) 162/78, pulse 79, temperature 98.1 F (36.7 C), height 5' 7 (1.702 m), weight 225 lb (102.1 kg), SpO2 97%. Body mass index is 35.24 kg/m.  General: She is overweight, cooperative, alert, well developed, and in no acute distress. PSYCH: Has normal mood, affect and thought process.   HEENT: EOMI, sclerae are anicteric. Lungs: Normal breathing effort, no conversational dyspnea. Extremities: No edema.  Neurologic: No gross sensory or motor deficits. No tremors or fasciculations noted.    Diagnostic Data Reviewed:  BMET    Component Value Date/Time   NA 139 11/05/2023 0000   K 4.7 11/05/2023 0000   CL 101 11/05/2023 0000   CO2 27 (A) 11/05/2023 0000   GLUCOSE 97 10/21/2022 1050   GLUCOSE 110 (H) 06/04/2020 1435   BUN 19 08/08/2024 0000   CREATININE 1.0 08/08/2024 0000   CREATININE  0.97 10/21/2022 1050   CREATININE 0.90 03/04/2017 1404   CALCIUM 9.7 08/08/2024 0000   GFRNONAA >60 06/04/2020 1435   GFRNONAA 69 03/04/2017 1404   GFRAA >60 10/28/2017 0418   GFRAA 79 03/04/2017 1404   Lab Results  Component Value Date   HGBA1C 5.5 08/08/2024   HGBA1C 5.6 03/04/2017   Lab Results  Component Value Date   INSULIN  12.7 10/21/2022   Lab Results  Component  Value Date   TSH 1.350 10/21/2022   CBC    Component Value Date/Time   WBC 5.3 08/08/2024 0000   WBC 5.9 06/04/2020 1435   RBC 4.48 08/08/2024 0000   HGB 13.3 08/08/2024 0000   HCT 42 08/08/2024 0000   PLT 276 08/08/2024 0000   MCV 93.3 06/04/2020 1435   MCH 29.4 06/04/2020 1435   MCHC 31.5 06/04/2020 1435   RDW 13.4 06/04/2020 1435   Iron Studies No results found for: IRON, TIBC, FERRITIN, IRONPCTSAT Lipid Panel     Component Value Date/Time   CHOL 151 08/08/2024 0000   CHOL 216 (H) 10/21/2022 1050   TRIG 91 08/08/2024 0000   HDL 50 08/08/2024 0000   HDL 45 10/21/2022 1050   CHOLHDL 5.3 (H) 03/04/2017 1404   VLDL 46 (H) 03/04/2017 1404   LDLCALC 84 08/08/2024 0000   LDLCALC 141 (H) 10/21/2022 1050   Hepatic Function Panel     Component Value Date/Time   PROT 7.9 10/21/2022 1050   ALBUMIN 4.3 08/08/2024 0000   ALBUMIN 4.5 10/21/2022 1050   AST 15 08/08/2024 0000   ALT 10 08/08/2024 0000   ALKPHOS 70 08/08/2024 0000   BILITOT 0.5 10/21/2022 1050      Component Value Date/Time   TSH 1.350 10/21/2022 1050   Nutritional Lab Results  Component Value Date   VD25OH 28.4 08/08/2024   VD25OH 50.8 10/21/2022   VD25OH 28 (L) 03/04/2017    Medications: Outpatient Encounter Medications as of 08/29/2024  Medication Sig   amLODipine  (NORVASC ) 5 MG tablet Take 1 tablet (5 mg total) by mouth daily.   cholecalciferol (VITAMIN D3) 25 MCG (1000 UNIT) tablet Take 1,000 Units by mouth daily.   Potassium 99 MG TABS Take 10 mg by mouth daily.   Rosuvastatin Calcium 5 MG CPSP Take by mouth.   semaglutide -weight management (WEGOVY ) 0.25 MG/0.5ML SOAJ SQ injection Inject 0.25 mg into the skin once a week.   telmisartan  (MICARDIS ) 20 MG tablet Take 1 tablet (20 mg total) by mouth daily.   No facility-administered encounter medications on file as of 08/29/2024.     Follow-Up   No follow-ups on file.SABRA She was informed of the importance of frequent follow up visits to  maximize her success with intensive lifestyle modifications for her multiple health conditions.  Attestation Statement   Reviewed by clinician on day of visit: allergies, medications, problem list, medical history, surgical history, family history, social history, and previous encounter notes.     Lucas Parker, MD  last office visit we had prescribed Wegovy  which was denied by her insurance since last office visit she has lost 6 pounds she reports following about a 1000-calorie nutrition low-carb moderate protein plan not following an exercise regimen now which she works out when she can

## 2024-10-24 ENCOUNTER — Ambulatory Visit (INDEPENDENT_AMBULATORY_CARE_PROVIDER_SITE_OTHER): Admitting: Internal Medicine
